# Patient Record
Sex: Female | Born: 1955 | Race: White | Hispanic: No | Marital: Married | Smoking: Never smoker
Health system: Southern US, Community
[De-identification: ages and names within clinical notes are randomized; demographics above are authoritative.]

## PROBLEM LIST (undated history)

## (undated) DIAGNOSIS — E785 Hyperlipidemia, unspecified: Secondary | ICD-10-CM

## (undated) DIAGNOSIS — F419 Anxiety disorder, unspecified: Secondary | ICD-10-CM

## (undated) DIAGNOSIS — F429 Obsessive-compulsive disorder, unspecified: Secondary | ICD-10-CM

## (undated) DIAGNOSIS — E66811 Obesity, class 1: Secondary | ICD-10-CM

## (undated) DIAGNOSIS — F32A Depression, unspecified: Secondary | ICD-10-CM

## (undated) DIAGNOSIS — Z8619 Personal history of other infectious and parasitic diseases: Secondary | ICD-10-CM

## (undated) DIAGNOSIS — I10 Essential (primary) hypertension: Secondary | ICD-10-CM

## (undated) DIAGNOSIS — F329 Major depressive disorder, single episode, unspecified: Secondary | ICD-10-CM

## (undated) DIAGNOSIS — E559 Vitamin D deficiency, unspecified: Secondary | ICD-10-CM

## (undated) DIAGNOSIS — Z87442 Personal history of urinary calculi: Secondary | ICD-10-CM

## (undated) DIAGNOSIS — T7840XA Allergy, unspecified, initial encounter: Secondary | ICD-10-CM

## (undated) DIAGNOSIS — M1990 Unspecified osteoarthritis, unspecified site: Secondary | ICD-10-CM

## (undated) DIAGNOSIS — J40 Bronchitis, not specified as acute or chronic: Secondary | ICD-10-CM

## (undated) DIAGNOSIS — J45909 Unspecified asthma, uncomplicated: Secondary | ICD-10-CM

## (undated) DIAGNOSIS — R7303 Prediabetes: Secondary | ICD-10-CM

## (undated) DIAGNOSIS — E669 Obesity, unspecified: Secondary | ICD-10-CM

## (undated) HISTORY — PX: DILATION AND CURETTAGE OF UTERUS: SHX78

## (undated) HISTORY — DX: Personal history of other infectious and parasitic diseases: Z86.19

## (undated) HISTORY — DX: Obesity, class 1: E66.811

## (undated) HISTORY — PX: BREAST SURGERY: SHX581

## (undated) HISTORY — DX: Unspecified asthma, uncomplicated: J45.909

## (undated) HISTORY — PX: TUBAL LIGATION: SHX77

## (undated) HISTORY — DX: Anxiety disorder, unspecified: F41.9

## (undated) HISTORY — DX: Vitamin D deficiency, unspecified: E55.9

## (undated) HISTORY — DX: Obesity, unspecified: E66.9

## (undated) HISTORY — DX: Obsessive-compulsive disorder, unspecified: F42.9

## (undated) HISTORY — PX: ACHILLES TENDON SURGERY: SHX542

## (undated) HISTORY — DX: Hyperlipidemia, unspecified: E78.5

## (undated) HISTORY — DX: Prediabetes: R73.03

## (undated) HISTORY — DX: Allergy, unspecified, initial encounter: T78.40XA

## (undated) HISTORY — DX: Depression, unspecified: F32.A

## (undated) HISTORY — DX: Major depressive disorder, single episode, unspecified: F32.9

## (undated) HISTORY — PX: BACK SURGERY: SHX140

## (undated) HISTORY — DX: Essential (primary) hypertension: I10

---

## 1998-08-14 ENCOUNTER — Ambulatory Visit (HOSPITAL_COMMUNITY): Admission: RE | Admit: 1998-08-14 | Discharge: 1998-08-14 | Payer: Self-pay | Admitting: *Deleted

## 1998-08-22 ENCOUNTER — Ambulatory Visit (HOSPITAL_COMMUNITY): Admission: RE | Admit: 1998-08-22 | Discharge: 1998-08-22 | Payer: Self-pay | Admitting: *Deleted

## 1998-09-03 ENCOUNTER — Ambulatory Visit (HOSPITAL_COMMUNITY): Admission: RE | Admit: 1998-09-03 | Discharge: 1998-09-03 | Payer: Self-pay | Admitting: *Deleted

## 1999-03-13 ENCOUNTER — Ambulatory Visit (HOSPITAL_COMMUNITY): Admission: RE | Admit: 1999-03-13 | Discharge: 1999-03-13 | Payer: Self-pay | Admitting: *Deleted

## 1999-07-15 ENCOUNTER — Other Ambulatory Visit: Admission: RE | Admit: 1999-07-15 | Discharge: 1999-07-15 | Payer: Self-pay | Admitting: *Deleted

## 1999-08-05 ENCOUNTER — Ambulatory Visit (HOSPITAL_COMMUNITY): Admission: RE | Admit: 1999-08-05 | Discharge: 1999-08-05 | Payer: Self-pay | Admitting: *Deleted

## 1999-09-22 ENCOUNTER — Ambulatory Visit (HOSPITAL_COMMUNITY): Admission: RE | Admit: 1999-09-22 | Discharge: 1999-09-22 | Payer: Self-pay | Admitting: *Deleted

## 2000-07-27 ENCOUNTER — Other Ambulatory Visit: Admission: RE | Admit: 2000-07-27 | Discharge: 2000-07-27 | Payer: Self-pay | Admitting: *Deleted

## 2000-08-12 ENCOUNTER — Ambulatory Visit (HOSPITAL_COMMUNITY): Admission: RE | Admit: 2000-08-12 | Discharge: 2000-08-12 | Payer: Self-pay | Admitting: *Deleted

## 2001-08-16 ENCOUNTER — Ambulatory Visit (HOSPITAL_COMMUNITY): Admission: RE | Admit: 2001-08-16 | Discharge: 2001-08-16 | Payer: Self-pay | Admitting: Internal Medicine

## 2001-08-16 ENCOUNTER — Encounter: Payer: Self-pay | Admitting: Internal Medicine

## 2001-08-23 ENCOUNTER — Other Ambulatory Visit: Admission: RE | Admit: 2001-08-23 | Discharge: 2001-08-23 | Payer: Self-pay | Admitting: Internal Medicine

## 2002-05-25 ENCOUNTER — Encounter: Payer: Self-pay | Admitting: Internal Medicine

## 2002-05-25 ENCOUNTER — Ambulatory Visit (HOSPITAL_COMMUNITY): Admission: RE | Admit: 2002-05-25 | Discharge: 2002-05-25 | Payer: Self-pay | Admitting: Internal Medicine

## 2002-11-23 ENCOUNTER — Ambulatory Visit (HOSPITAL_COMMUNITY): Admission: RE | Admit: 2002-11-23 | Discharge: 2002-11-23 | Payer: Self-pay | Admitting: Internal Medicine

## 2002-11-23 ENCOUNTER — Encounter: Payer: Self-pay | Admitting: Internal Medicine

## 2002-12-07 HISTORY — PX: LUMBAR DISC SURGERY: SHX700

## 2003-09-05 ENCOUNTER — Other Ambulatory Visit: Admission: RE | Admit: 2003-09-05 | Discharge: 2003-09-05 | Payer: Self-pay | Admitting: Internal Medicine

## 2003-12-03 ENCOUNTER — Ambulatory Visit (HOSPITAL_COMMUNITY): Admission: RE | Admit: 2003-12-03 | Discharge: 2003-12-03 | Payer: Self-pay | Admitting: Internal Medicine

## 2004-07-28 ENCOUNTER — Ambulatory Visit (HOSPITAL_COMMUNITY): Admission: RE | Admit: 2004-07-28 | Discharge: 2004-07-29 | Payer: Self-pay | Admitting: Neurosurgery

## 2005-01-09 ENCOUNTER — Ambulatory Visit (HOSPITAL_COMMUNITY): Admission: RE | Admit: 2005-01-09 | Discharge: 2005-01-09 | Payer: Self-pay | Admitting: Internal Medicine

## 2005-09-30 ENCOUNTER — Ambulatory Visit (HOSPITAL_COMMUNITY): Admission: RE | Admit: 2005-09-30 | Discharge: 2005-09-30 | Payer: Self-pay | Admitting: Obstetrics & Gynecology

## 2005-09-30 ENCOUNTER — Encounter (INDEPENDENT_AMBULATORY_CARE_PROVIDER_SITE_OTHER): Payer: Self-pay | Admitting: Specialist

## 2005-12-06 ENCOUNTER — Emergency Department (HOSPITAL_COMMUNITY): Admission: EM | Admit: 2005-12-06 | Discharge: 2005-12-06 | Payer: Self-pay | Admitting: Family Medicine

## 2006-01-21 ENCOUNTER — Ambulatory Visit (HOSPITAL_COMMUNITY): Admission: RE | Admit: 2006-01-21 | Discharge: 2006-01-21 | Payer: Self-pay | Admitting: Internal Medicine

## 2006-07-15 ENCOUNTER — Encounter (INDEPENDENT_AMBULATORY_CARE_PROVIDER_SITE_OTHER): Payer: Self-pay | Admitting: *Deleted

## 2006-07-15 ENCOUNTER — Ambulatory Visit (HOSPITAL_COMMUNITY): Admission: RE | Admit: 2006-07-15 | Discharge: 2006-07-15 | Payer: Self-pay | Admitting: *Deleted

## 2007-02-24 ENCOUNTER — Ambulatory Visit (HOSPITAL_COMMUNITY): Admission: RE | Admit: 2007-02-24 | Discharge: 2007-02-24 | Payer: Self-pay | Admitting: Internal Medicine

## 2008-03-15 ENCOUNTER — Ambulatory Visit (HOSPITAL_COMMUNITY): Admission: RE | Admit: 2008-03-15 | Discharge: 2008-03-15 | Payer: Self-pay | Admitting: Internal Medicine

## 2008-10-10 ENCOUNTER — Other Ambulatory Visit: Admission: RE | Admit: 2008-10-10 | Discharge: 2008-10-10 | Payer: Self-pay | Admitting: Internal Medicine

## 2009-05-07 ENCOUNTER — Ambulatory Visit (HOSPITAL_COMMUNITY): Admission: RE | Admit: 2009-05-07 | Discharge: 2009-05-07 | Payer: Self-pay | Admitting: Internal Medicine

## 2009-11-28 ENCOUNTER — Encounter (INDEPENDENT_AMBULATORY_CARE_PROVIDER_SITE_OTHER): Payer: Self-pay | Admitting: *Deleted

## 2010-01-16 LAB — HM COLONOSCOPY

## 2010-06-02 ENCOUNTER — Ambulatory Visit (HOSPITAL_COMMUNITY): Admission: RE | Admit: 2010-06-02 | Discharge: 2010-06-02 | Payer: Self-pay | Admitting: Internal Medicine

## 2010-12-02 ENCOUNTER — Other Ambulatory Visit
Admission: RE | Admit: 2010-12-02 | Discharge: 2010-12-02 | Payer: Self-pay | Source: Home / Self Care | Admitting: Internal Medicine

## 2011-04-24 NOTE — Op Note (Signed)
NAME:  Sonya Peterson, Sonya Peterson                         ACCOUNT NO.:  [id number]   MEDICAL RECORD NO.:  [id number]                   PATIENT TYPE:  OIB   LOCATION:  3172                                 FACILITY:  [facility]   PHYSICIAN:  Donalee Citrin, M.D.                     DATE OF BIRTH:  [birth year]   DATE OF PROCEDURE:  07/28/2004  DATE OF DISCHARGE:                                 OPERATIVE REPORT   PREOPERATIVE DIAGNOSIS:  Right L5 radiculopathy from a large ruptured disk,  L4-5, right.   OPERATION/PROCEDURE:  Lumbar laminectomy and microdiskectomy, L4-5 right.   SURGEON:  Donalee Citrin, M.D.   ANESTHESIA:  General.   HISTORY OF PRESENT ILLNESS:  The patient is a very pleasant 55 year old  female who has had longstanding back and right leg pain that began in  buttock and radiated down her back side of her right leg crossing over her  foot and her big toe.  The patient had weakness of the big toe with good  strength on dorsiflexion.  The patient failed all modalities of conservative  treatment.  She showed a very large ruptured disk with a free fragment  compressing the thecal sac and right L5 nerve root.  The patient was  recommended laminectomy and microdiskectomy.  I discussed all the risks and  benefits of surgery with him and he agreed to proceed forth.   DESCRIPTION OF PROCEDURE:  The patient was brought to the OR and received  general anesthesia, positioned prone on the Wilson table and spine was  prepped and draped in the usual sterile fashion, preoperative x-ray was  performed localizing the L4-L5 disk space.  After infiltration of 10 mL of  lidocaine, a midline incision was made and Bovie electrocautery was used to  deepen down to the subcutaneous tissue.  Subperiosteal dissection was  carried out to the lamina on the right side of L4-5.  An intraoperative x-  ray confirmed localization of the L4-5 disk space and then using high-speed  drill __________ lamina of L4 medial of the facet  complex and L4-5 was  drilled down.  By using a 3 mm and 4 mm Kerrison punch, the remainder of the  lamina was removed exposing the ligamentous flavum.  This was removed in a  piecemeal fashion exposing the thecal sac.  Then the operative microscope  was draped and brought onto the field __________ the right L5 nerve root was  identified and the right L5 neural foramina was unroofed using the 3 mm  Kerrison punch.  The remainder of the ligament was underneath the L4-5 facet  and then using a #4 Penfield and blunt nerve hook, the L5 nerve root was  dissected free from a free fragment that migrated caudally behind the L5  vertebral body from the L4-5 disk space.  The nerve root was noted to  markedly adherent to this fragment and  the disk was teased away with #4  Penfield and blunt nerve hook.  The L5 disk was removed and the disk space  was incised with a 11-blade scalpel and the disk was radically cleaned out.  __________ central compartment as well as inferiorly underneath the nerve  root.  After the disk spaces were adequately cleaned out with a combination  of pituitary rongeurs and downgoing Epstein curettes.  The thecal sac was  explored with a coronary dilator and a hockey stick and noted to have no  further stenosis cephalad, caudad, medial, and laterally.  Then the wound  was copiously irrigated and meticulous hemostasis was maintained.  Gelfoam  was laid over the top of the dura.  The muscle fascia reapproximated with 0  interrupted Vicryl, subcutaneous tissue  closed with 2-0 interrupted Vicryl and skin was closed with a running 4-0  subcuticular and Benzoin and Steri-Strips were applied.  The patient went to  the recovery room in stable condition.  At the end of the case sponge and  needle counts correct.                                               Donalee Citrin, M.D.    GC/MEDQ  D:  07/28/2004  T:  07/28/2004  Job:  [id number]

## 2011-04-24 NOTE — Op Note (Signed)
[name], Sonya Peterson               ACCOUNT NO.:  [id number]   MEDICAL RECORD NO.:  [id number]          PATIENT TYPE:  AMB   LOCATION:  SDC                           FACILITY:  [facility]   PHYSICIAN:  Kenbridge B. Earlene Plater, M.D.  DATE OF BIRTH:  Feb 15, 1956   DATE OF PROCEDURE:  07/15/2006  DATE OF DISCHARGE:                                 OPERATIVE REPORT   PREOPERATIVE DIAGNOSIS:  Menorrhagia.   POSTOPERATIVE DIAGNOSIS:  Menorrhagia.   PROCEDURE:  Hysteroscopy, dilatation and curettage, NovaSure.   SURGEON:  Chester Holstein. Earlene Plater, M.D.   ASSISTANT:  None.   ANESTHESIA:  MAC and 20 mL 1% Nesacaine paracervical block.   SPECIMENS:  Endometrial curettings to pathology.   ESTIMATED BLOOD LOSS:  Minimal.   FLUID DEFICIT:  5 mL of saline.   COMPLICATIONS:  None.   INDICATIONS:  The patient with a history of heavy menstrual bleeding.  Ultrasound showed no focal mass.  The patient advised of the risks of  surgery including infection, bleeding, damage to surrounding organs, uterine  perforation.  Also advised of approximately 50-60% amenorrhea rate and 85%  overall satisfaction rate after this procedure.   The patient was taken to the operating room and MAC anesthesia obtained.  The patient was placed in the [facility] stirrups and prepped and draped in  standard fashion, bladder emptied with in-and-out catheterization.  Examination under anesthesia showed a midplane uterus and normal size  without adnexal masses.   Speculum inserted, paracervical block placed and a single-tooth tenaculum  attached to the anterior lip.  The uterus was sounded to 9 cm and cervical  length estimated at 4.5 cm with the dilator.   The cervix was patulous and easily allowed passage of the diagnostic  hysteroscope, which after being flushed no focal masses were seen in the  uterus, only shaggy-appearing endometrium.  The endometrium was gently  curetted and submitted to pathology.   The NovaSure device was inserted to  the fundus, array deployed and standard  maneuvers undertaken to position the array properly.  Given the patulous  cervix, a piece of Vaseline gauze was placed around the cervical os to  facilitate the CO2 test, which was then performed and passed.   The NovaSure cavity width was 3 cm and the device was activated and ablated  for 2 minutes.   The NovaSure array retracted and device removed.  The scope reinserted and  good endometrial coverage noted.   The patient tolerated the procedure well with no complications.  She was  taken to the recovery room awake, alert and in stable condition.      Gerri Spore B. Earlene Plater, M.D.  Electronically Signed     WBD/MEDQ  D:  07/15/2006  T:  07/15/2006  Job:  [id number]

## 2011-04-24 NOTE — Op Note (Signed)
Sonya Peterson, Sonya Peterson               ACCOUNT NO.:  [id number]   MEDICAL RECORD NO.:  [id number]          PATIENT TYPE:  AMB   LOCATION:  SDC                           FACILITY:  [facility]   PHYSICIAN:  Genia Del, M.D.DATE OF BIRTH:  07/10/1956   DATE OF PROCEDURE:  09/30/2005  DATE OF DISCHARGE:                                 OPERATIVE REPORT   PREOPERATIVE DIAGNOSIS:  Menorrhagia with intrauterine lesion.   POSTOPERATIVE DIAGNOSIS:  Menorrhagia with intrauterine lesion.   INTERVENTION:  Hysteroscopy with resection of intrauterine lesion and  dilatation and curettage.   SURGEON:  Genia Del, M.D.   ANESTHESIOLOGIST:  Burnett Corrente, M.D.   PROCEDURE:  Under general anesthesia with endotracheal intubation, the  patient is in lithotomy position.  She is prepped with Betadine on the  suprapubic, vulvar and vaginal areas, the bladder is catheterized and the  patient is draped as usual.  The vaginal exam under general anesthesia  reveals an anteverted uterus, normal volume, no adnexal mass.  The speculum  is inserted in the vagina.  The anterior lip of the cervix is grasped with a  tenaculum.  A paracervical block is done with Nesacaine 1% 20 mL total at 4  and 8 o'clock.  We then dilate the cervix with Hegar dilators up to #33  without difficulty.  We use the resectoscope directly with the loop.  We  visualize the intrauterine cavity.  Both ostia are seen and pictures are  taken.  A small about 1 cm endometrial polyp is seen at the posterior  fundus.  We use the loop to resect the lesion at the base and send it  separately to pathology.  We then take pictures after resection.  We remove  the resectoscope.  Systematic intrauterine curettage is done with a sharp  curette.  The specimen is sent to pathology.  We remove all instruments.  Hemostasis is adequate.  The estimated blood loss is minimal.  Fluid deficit  was minimal.  No complication occurred, and the patient was  brought to the  recovery room in good status.      Genia Del, M.D.  Electronically Signed     ML/MEDQ  D:  09/30/2005  T:  09/30/2005  Job:  [id number]

## 2011-05-11 ENCOUNTER — Ambulatory Visit (HOSPITAL_COMMUNITY)
Admission: RE | Admit: 2011-05-11 | Discharge: 2011-05-11 | Disposition: A | Discharge status: Home / Self Care | Source: Ambulatory Visit | Attending: Internal Medicine | Admitting: Internal Medicine

## 2011-05-11 ENCOUNTER — Other Ambulatory Visit (HOSPITAL_COMMUNITY): Payer: Self-pay | Admitting: Internal Medicine

## 2011-05-11 DIAGNOSIS — R52 Pain, unspecified: Secondary | ICD-10-CM

## 2011-05-11 DIAGNOSIS — M7730 Calcaneal spur, unspecified foot: Secondary | ICD-10-CM | POA: Insufficient documentation

## 2011-07-27 ENCOUNTER — Other Ambulatory Visit (HOSPITAL_COMMUNITY): Payer: Self-pay | Admitting: Internal Medicine

## 2011-07-27 DIAGNOSIS — Z1231 Encounter for screening mammogram for malignant neoplasm of breast: Secondary | ICD-10-CM

## 2011-07-30 ENCOUNTER — Ambulatory Visit (HOSPITAL_COMMUNITY)
Admission: RE | Admit: 2011-07-30 | Discharge: 2011-07-30 | Disposition: A | Discharge status: Home / Self Care | Source: Ambulatory Visit | Attending: Internal Medicine | Admitting: Internal Medicine

## 2011-07-30 DIAGNOSIS — Z1231 Encounter for screening mammogram for malignant neoplasm of breast: Secondary | ICD-10-CM

## 2013-03-02 ENCOUNTER — Other Ambulatory Visit (HOSPITAL_COMMUNITY)
Admission: RE | Admit: 2013-03-02 | Discharge: 2013-03-02 | Disposition: A | Discharge status: Home / Self Care | Source: Ambulatory Visit | Attending: Internal Medicine | Admitting: Internal Medicine

## 2013-03-02 ENCOUNTER — Other Ambulatory Visit: Payer: Self-pay

## 2013-03-02 DIAGNOSIS — Z1151 Encounter for screening for human papillomavirus (HPV): Secondary | ICD-10-CM | POA: Insufficient documentation

## 2013-03-02 DIAGNOSIS — Z01419 Encounter for gynecological examination (general) (routine) without abnormal findings: Secondary | ICD-10-CM | POA: Insufficient documentation

## 2013-03-03 LAB — HM PAP SMEAR: HM Pap smear: NORMAL

## 2013-09-26 ENCOUNTER — Other Ambulatory Visit (HOSPITAL_COMMUNITY): Payer: Self-pay | Admitting: Internal Medicine

## 2013-09-26 DIAGNOSIS — Z1231 Encounter for screening mammogram for malignant neoplasm of breast: Secondary | ICD-10-CM

## 2013-10-09 ENCOUNTER — Ambulatory Visit (HOSPITAL_COMMUNITY)
Admission: RE | Admit: 2013-10-09 | Discharge: 2013-10-09 | Disposition: A | Discharge status: Home / Self Care | Source: Ambulatory Visit | Attending: Internal Medicine | Admitting: Internal Medicine

## 2013-10-09 DIAGNOSIS — Z1231 Encounter for screening mammogram for malignant neoplasm of breast: Secondary | ICD-10-CM | POA: Insufficient documentation

## 2013-10-11 ENCOUNTER — Other Ambulatory Visit: Payer: Self-pay | Admitting: Internal Medicine

## 2013-10-11 DIAGNOSIS — R928 Other abnormal and inconclusive findings on diagnostic imaging of breast: Secondary | ICD-10-CM

## 2013-10-30 ENCOUNTER — Ambulatory Visit
Admission: RE | Admit: 2013-10-30 | Discharge: 2013-10-30 | Disposition: A | Discharge status: Home / Self Care | Payer: PRIVATE HEALTH INSURANCE | Source: Ambulatory Visit | Attending: Internal Medicine | Admitting: Internal Medicine

## 2013-10-30 DIAGNOSIS — R928 Other abnormal and inconclusive findings on diagnostic imaging of breast: Secondary | ICD-10-CM

## 2013-11-07 ENCOUNTER — Encounter: Payer: Self-pay | Admitting: Internal Medicine

## 2013-11-07 DIAGNOSIS — E559 Vitamin D deficiency, unspecified: Secondary | ICD-10-CM | POA: Insufficient documentation

## 2013-11-07 DIAGNOSIS — E785 Hyperlipidemia, unspecified: Secondary | ICD-10-CM | POA: Insufficient documentation

## 2013-11-07 DIAGNOSIS — F329 Major depressive disorder, single episode, unspecified: Secondary | ICD-10-CM | POA: Insufficient documentation

## 2013-11-07 DIAGNOSIS — I10 Essential (primary) hypertension: Secondary | ICD-10-CM | POA: Insufficient documentation

## 2013-11-07 DIAGNOSIS — T7840XA Allergy, unspecified, initial encounter: Secondary | ICD-10-CM | POA: Insufficient documentation

## 2013-11-09 ENCOUNTER — Ambulatory Visit (INDEPENDENT_AMBULATORY_CARE_PROVIDER_SITE_OTHER): Admitting: Physician Assistant

## 2013-11-09 ENCOUNTER — Encounter: Payer: Self-pay | Admitting: Physician Assistant

## 2013-11-09 VITALS — BP 128/70 | HR 68 | Temp 98.2°F | Resp 16 | Ht 65.0 in | Wt 202.0 lb

## 2013-11-09 DIAGNOSIS — I10 Essential (primary) hypertension: Secondary | ICD-10-CM

## 2013-11-09 DIAGNOSIS — R7309 Other abnormal glucose: Secondary | ICD-10-CM

## 2013-11-09 DIAGNOSIS — R7303 Prediabetes: Secondary | ICD-10-CM

## 2013-11-09 DIAGNOSIS — E559 Vitamin D deficiency, unspecified: Secondary | ICD-10-CM

## 2013-11-09 DIAGNOSIS — E785 Hyperlipidemia, unspecified: Secondary | ICD-10-CM

## 2013-11-09 HISTORY — DX: Prediabetes: R73.03

## 2013-11-09 LAB — BASIC METABOLIC PANEL WITH GFR
BUN: 15 mg/dL (ref 6–23)
CO2: 29 mEq/L (ref 19–32)
Chloride: 102 mEq/L (ref 96–112)
GFR, Est African American: >89 mL/min
Glucose, Bld: 78 mg/dL (ref 70–99)
Potassium: 4 mEq/L (ref 3.5–5.3)
Sodium: 139 mEq/L (ref 135–145)

## 2013-11-09 LAB — HEPATIC FUNCTION PANEL
ALT: 13 U/L (ref 0–35)
AST: 14 U/L (ref 0–37)
Bilirubin, Direct: 0.1 mg/dL (ref 0.0–0.3)
Indirect Bilirubin: 0.3 mg/dL (ref 0.0–0.9)
Total Bilirubin: 0.4 mg/dL (ref 0.3–1.2)
Total Protein: 7.5 g/dL (ref 6.0–8.3)

## 2013-11-09 LAB — CBC WITH DIFFERENTIAL/PLATELET
Basophils Relative: 1 % (ref 0–1)
Eosinophils Absolute: 0.4 10*3/uL (ref 0.0–0.7)
Eosinophils Relative: 4 % (ref 0–5)
Hemoglobin: 14.1 g/dL (ref 12.0–15.0)
Lymphs Abs: 2.4 10*3/uL (ref 0.7–4.0)
MCH: 29.5 pg (ref 26.0–34.0)
Neutro Abs: 6.4 10*3/uL (ref 1.7–7.7)
Platelets: 303 10*3/uL (ref 150–400)

## 2013-11-09 LAB — LIPID PANEL
Cholesterol: 126 mg/dL (ref 0–200)
HDL: 36 mg/dL — ABNORMAL LOW (ref >39)
Total CHOL/HDL Ratio: 3.5 Ratio
Triglycerides: 128 mg/dL (ref <150)
VLDL: 26 mg/dL (ref 0–40)

## 2013-11-09 LAB — TSH: TSH: 0.912 u[IU]/mL (ref 0.350–4.500)

## 2013-11-09 NOTE — Patient Instructions (Signed)
   Bad carbs also include fruit juice, alcohol, and sweet tea. These are empty calories that do not signal to your brain that you are full.   Please remember the good carbs are still carbs which convert into sugar. So please measure them out no more than 1/2-1 cup of rice, oatmeal, pasta, and beans.  Veggies are however free foods! Pile them on.   I like lean protein at every meal such as chicken, turkey, pork chops, cottage cheese, etc. Just do not fry these meats and please center your meal around vegetable, the meats should be a side dish.   No all fruit is created equal. Please see the list below, the fruit at the bottom is higher in sugars than the fruit at the top   We want weight loss that will last so you should lose 1-2 pounds a week.  THAT IS IT! Please pick THREE things a month to change. Once it is a habit check off the item. Then pick another three items off the list to become habits.  If you are already doing a habit on the list GREAT!  Cross that item off! o Don't drink your calories. Ie, alcohol, soda, fruit juice, and sweet tea.  o Drink more water. Drink a glass when you feel hungry or before each meal.  o Eat breakfast - Complex carb and protein (likeDannon light and fit yogurt, oatmeal, fruit, eggs, turkey bacon). o Measure your cereal.  Eat no more than one cup a day. (ie Kashi) o Eat an apple a day. o Add a vegetable a day. o Try a new vegetable a month. o Use Pam! Stop using oil or butter to cook. o Don't finish your plate or use smaller plates. o Share your dessert. o Eat sugar free Jello for dessert or frozen grapes. o Don't eat 2-3 hours before bed. o Switch to whole wheat bread, pasta, and brown rice. o Make healthier choices when you eat out. No fries! o Pick baked chicken, NOT fried. o Don't forget to SLOW DOWN when you eat. It is not going anywhere.  o Take the stairs. o Park far away in the parking lot o Lift soup cans (or weights) for 10 minutes while  watching TV. o Walk at work for 10 minutes during break. o Walk outside 1 time a week with your friend, kids, dog, or significant other. o Start a walking group at church. o Walk the mall as much as you can tolerate.  o Keep a food diary. o Weigh yourself daily. o Walk for 15 minutes 3 days per week. o Cook at home more often and eat out less.  If life happens and you go back to old habits, it is okay.  Just start over. You can do it!   If you experience chest pain, get short of breath, or tired during the exercise, please stop immediately and inform your doctor.   

## 2013-11-09 NOTE — Progress Notes (Signed)
HPI Patient presents for 3 month follow up with hypertension, hyperlipidemia, prediabetes and vitamin D. Patient's blood pressure has been controlled at home. Patient denies chest pain, shortness of breath, dizziness.  Patient's cholesterol is diet controlled. In addition they are on zocor and denies myalgias. The cholesterol last visit was HDL is 39 with goal of 50 and LDL is 59. The patient has been working on diet and exercise for prediabetes, and denies changes in vision, polys, and paresthesias. A1C is 5.7. Patient is on Vitamin D supplement.  Current Medications:  Current Outpatient Prescriptions on File Prior to Visit  Medication Sig Dispense Refill  . Cholecalciferol (VITAMIN D PO) Take 5,000 Int'l Units by mouth.      Marland Kitchen FLUoxetine HCl (PROZAC PO) Take 60 mg by mouth.      . loratadine (CLARITIN) 10 MG tablet Take 10 mg by mouth daily.      . meloxicam (MOBIC) 7.5 MG tablet Take 7.5 mg by mouth daily.      . mometasone (ASMANEX 120 METERED DOSES) 220 MCG/INH inhaler Inhale 2 puffs into the lungs daily.      . montelukast (SINGULAIR) 10 MG tablet Take 10 mg by mouth at bedtime.      . simvastatin (ZOCOR) 40 MG tablet Take 40 mg by mouth daily.       No current facility-administered medications on file prior to visit.   Medical History:  Past Medical History  Diagnosis Date  . Hyperlipidemia   . Hypertension   . Allergy   . Vitamin D deficiency   . Depression    Allergies: No Known Allergies  ROS Constitutional: Denies fever, chills, weight loss/gain, headaches, insomnia, fatigue, night sweats, and change in appetite. Eyes: Denies redness, blurred vision, diplopia, discharge, itchy, watery eyes.  ENT: Denies discharge, congestion, post nasal drip, sore throat, earache, dental pain, Tinnitus, Vertigo, Sinus pain, snoring.  Cardio: Denies chest pain, palpitations, irregular heartbeat,  dyspnea, diaphoresis, orthopnea, PND, claudication, edema Respiratory: denies cough,  dyspnea,pleurisy, hoarseness, wheezing.  Gastrointestinal: Denies dysphagia, heartburn,  water brash, pain, cramps, nausea, vomiting, bloating, diarrhea, constipation, hematemesis, melena, hematochezia,  hemorrhoids Genitourinary: Denies dysuria, frequency, urgency, nocturia, hesitancy, discharge, hematuria, flank pain Musculoskeletal: Denies arthralgia, myalgia, stiffness, Jt. Swelling, pain, limp, and strain/sprain. Skin: Denies pruritis, rash, hives, warts, acne, eczema, changing in skin lesion Neuro: Denies Weakness, tremor, incoordination, spasms, paresthesia, pain Psychiatric: Denies confusion, memory loss, sensory loss Endocrine: Denies change in weight, skin, hair change, nocturia, and paresthesia, Diabetic Polys, Denies visual blurring, hyper /hypo glycemic episodes.  Heme/Lymph: Denies Excessive bleeding, bruising, enlarged lymph nodes  Family history- Review and unchanged Social history- Review and unchanged Physical Exam: Filed Vitals:   11/09/13 0955  BP: 128/70  Pulse: 68  Temp: 98.2 F (36.8 C)  Resp: 16   Filed Weights   11/09/13 0955  Weight: 202 lb (91.627 kg)   General Appearance: Well nourished, in no apparent distress. Eyes: PERRLA, EOMs, conjunctiva no swelling or erythema, normal fundi and vessels. Sinuses: No Frontal/maxillary tenderness ENT/Mouth: Ext aud canals clear, with TMs without erythema, bulging.No erythema, swelling, or exudate on post pharynx.  Tonsils not swollen or erythematous. Hearing normal.  Neck: Supple, thyroid normal.  Respiratory: Respiratory effort normal, BS equal bilaterally without rales, rhonchi, wheezing or stridor.  Cardio: Heart sounds normal, regular rate and rhythm without murmurs, rubs or gallops. Peripheral pulses brisk and equal bilaterally, without edema.  Abdomen: Obese, soft, with bowel sounds. Non tender, no guarding, rebound, hernias, masses, or organomegaly.  Lymphatics: Non tender without lymphadenopathy.   Musculoskeletal: Full ROM all peripheral extremities, joint stability, 5/5 strength, and normal gait. Skin: Warm, dry without rashes, lesions, ecchymosis.  Neuro: Cranial nerves intact, reflexes equal bilaterally. Normal muscle tone, no cerebellar symptoms. Sensation intact.  Psych: Awake and oriented X 3, normal affect, Insight and Judgment appropriate.   Assessment and Plan:  Hypertension: Continue medication, monitor blood pressure at home. Continue DASH diet. Cholesterol: Continue diet and exercise. Check cholesterol.  Pre-diabetes-Continue diet and exercise. Check A1C Vitamin D Def- check level and continue medications.   Sonya Peterson 10:00 AM

## 2013-12-05 ENCOUNTER — Other Ambulatory Visit: Payer: Self-pay | Admitting: Internal Medicine

## 2013-12-07 HISTORY — PX: HEEL SPUR SURGERY: SHX665

## 2013-12-19 ENCOUNTER — Other Ambulatory Visit: Payer: Self-pay | Admitting: Physician Assistant

## 2013-12-19 MED ORDER — MELOXICAM 7.5 MG PO TABS: 7.5000 mg | ORAL_TABLET | Freq: Every day | ORAL | Status: DC

## 2013-12-27 ENCOUNTER — Ambulatory Visit (INDEPENDENT_AMBULATORY_CARE_PROVIDER_SITE_OTHER)

## 2013-12-27 VITALS — BP 128/77 | HR 89 | Resp 12 | Ht 66.0 in | Wt 190.0 lb

## 2013-12-27 DIAGNOSIS — Q828 Other specified congenital malformations of skin: Secondary | ICD-10-CM

## 2013-12-27 DIAGNOSIS — M79609 Pain in unspecified limb: Secondary | ICD-10-CM

## 2013-12-27 DIAGNOSIS — B070 Plantar wart: Secondary | ICD-10-CM

## 2013-12-27 NOTE — Progress Notes (Signed)
   Subjective:    Patient ID: Sonya Peterson, female    DOB: August 26, 1956, 58 y.o.   MRN: [id number]  HPI Comments: '' CALLUSES ON BOTH FEET AND THEY HURTING WHEN WALKING.'' THEY BEEN HURTING FOR 1 MONTH AND GETTING WORSE AND TRY TO PUT LOTION.''     Review of Systems  Eyes: Positive for itching.  Musculoskeletal: Positive for back pain.  All other systems reviewed and are negative.       Objective:   Physical Exam Neurovascular status is intact pedal pulses palpable DP and PT posterior were for Refill timed 3-4 seconds skin temperature warm turgor normal no edema rubor pallor or varicosities noted. Neurologically epicritic and proprioceptive sensations intact and symmetric. On much exam there is multiple multiple nucleated keratotic lesions well-circumscribed plantar heel mid arch lateral column of the foot and under the hallux left and a single lesion right hallux. These are painful on direct lateral compression consistent with verruca plantaris for support keratoses to       Assessment & Plan:  Assessment verruca plantaris for support keratosis multiple lesions left foot. Recommend patient topical salicylic acid Aquasol instruction sheet is dispensed. Follow the instructions for topical salicylic acid application for 786 day duration under occlusion with duct tape discontinue any irritation followup in a month if fails to improve or resolve.  Harriet Masson DPM

## 2013-12-27 NOTE — Patient Instructions (Addendum)
WARTS (Verrucae)  Warts are caused by a virus that has invaded the skin.  They are more common in young adults and children and a small percentage will resolve on their own.  There are many types of warts including mosaic warts (large flat), vulgaris (domed warts-have pearl like appearance), and plantar warts (flat or cauliflower like appearance).  Warts are highly contagious and may be picked up from any surface.  Warts thrive in a warm moist environment and are common near pools, showers, and locker room floors.  Any microscopic cut in the skin is where the virus enters and becomes a wart.  Warts are very difficult to treat and get rid of.  Patience is necessary in the treatment of this virus.  It may take months to cure and different methods may have to be used to get rid of your wart.  Standard Initial Treatment is: 1. Periodic debridement of the wart and application of Canthacur to each lesion (a blistering agent that will slough off the warty skin) 2. Dispensing of topical treatments/prescriptions to apply to the wart at home  Other options include: 1. Excision of the lesion-numbing the skin around the wart and cutting it out-requires daily soaks post-operatively and takes about 2-3 weeks to fully heal 2. Excision with CO2 Laser-Performed at the surgical center your foot is numbed up and the lesions are all cut out and then lasered with a high power laser.  Very good for multiple warts that are resistant. 3. Cimetidine (Tagamet)-Oral agent used in high does--has shown better results in children  How do I apply the standard topical treatments?  1. Salicylic Acid (Compound W wart remover liquid or gel-available at drug or grocery stores)-Apply a dime size thickness over the wart and cover with duct tape-apply at night so the medication does not spread out to the good skin.  The skin will turn white and slowly blister off.  Use a pumice stone daily to remove the white skin as best you can.  If  the skin gets too raw and painful, discontinue for a few days then resume. 2. Aldara (Imiquimod)-this is an immune response modifier.  They come in little packets so try to get at least 2 days out of each packet if you can.  Apply a small amount to the lesion and cover with duct tape.  Do not rub it in-let it absorb on its own.  Good to apply each morning. Cover these areas with duct tape for 24. After application. Maintain treatment for 7-14 days  Other Helpful Hints:  Wash shoes that can be washed in the washing machine 2-3 x per month with some bleach  Use Lysol in shoes that cannot be washed and wipe out with a cloth 1 x per week-allow to dry for 8 hours before wearing again  Use a bleach solution (1 part bleach to 3 parts water) in your tub or shower to reduce the spread of the virus to yourself and others  Use aqua socks or clean sandals when at the pool or locker room to reduce the chance of picking up the virus or spreading it to others

## 2014-01-22 ENCOUNTER — Other Ambulatory Visit: Payer: Self-pay | Admitting: Physician Assistant

## 2014-01-24 ENCOUNTER — Ambulatory Visit

## 2014-03-03 ENCOUNTER — Other Ambulatory Visit: Payer: Self-pay | Admitting: Physician Assistant

## 2014-03-08 ENCOUNTER — Ambulatory Visit (INDEPENDENT_AMBULATORY_CARE_PROVIDER_SITE_OTHER): Admitting: Physician Assistant

## 2014-03-08 ENCOUNTER — Encounter: Payer: Self-pay | Admitting: Physician Assistant

## 2014-03-08 VITALS — BP 122/80 | HR 76 | Temp 97.7°F | Resp 16 | Ht 65.0 in | Wt 202.0 lb

## 2014-03-08 DIAGNOSIS — E559 Vitamin D deficiency, unspecified: Secondary | ICD-10-CM

## 2014-03-08 DIAGNOSIS — F32A Depression, unspecified: Secondary | ICD-10-CM

## 2014-03-08 DIAGNOSIS — I10 Essential (primary) hypertension: Secondary | ICD-10-CM

## 2014-03-08 DIAGNOSIS — R7303 Prediabetes: Secondary | ICD-10-CM

## 2014-03-08 DIAGNOSIS — Z79899 Other long term (current) drug therapy: Secondary | ICD-10-CM

## 2014-03-08 DIAGNOSIS — Z0000 Encounter for general adult medical examination without abnormal findings: Secondary | ICD-10-CM

## 2014-03-08 DIAGNOSIS — F329 Major depressive disorder, single episode, unspecified: Secondary | ICD-10-CM

## 2014-03-08 DIAGNOSIS — R7309 Other abnormal glucose: Secondary | ICD-10-CM

## 2014-03-08 LAB — CBC WITH DIFFERENTIAL/PLATELET
BASOS PCT: 1 % (ref 0–1)
Basophils Absolute: 0.1 10*3/uL (ref 0.0–0.1)
EOS ABS: 0.5 10*3/uL (ref 0.0–0.7)
Eosinophils Relative: 6 % — ABNORMAL HIGH (ref 0–5)
HEMATOCRIT: 41.2 % (ref 36.0–46.0)
Hemoglobin: 13.9 g/dL (ref 12.0–15.0)
LYMPHS ABS: 2.2 10*3/uL (ref 0.7–4.0)
LYMPHS PCT: 25 % (ref 12–46)
MCH: 29.5 pg (ref 26.0–34.0)
MCHC: 33.7 g/dL (ref 30.0–36.0)
MCV: 87.5 fL (ref 78.0–100.0)
MONO ABS: 0.8 10*3/uL (ref 0.1–1.0)
MONOS PCT: 9 % (ref 3–12)
Neutro Abs: 5.3 10*3/uL (ref 1.7–7.7)
Neutrophils Relative %: 59 % (ref 43–77)
Platelets: 311 10*3/uL (ref 150–400)
RBC: 4.71 MIL/uL (ref 3.87–5.11)
RDW: 13.6 % (ref 11.5–15.5)
WBC: 8.9 10*3/uL (ref 4.0–10.5)

## 2014-03-08 LAB — HEMOGLOBIN A1C
HEMOGLOBIN A1C: 5.8 % — AB (ref <5.7)
Mean Plasma Glucose: 120 mg/dL — ABNORMAL HIGH (ref <117)

## 2014-03-08 NOTE — Progress Notes (Signed)
Complete Physical HPI 58 y.o. female  presents for a complete physical. Her blood pressure has been controlled at home, today their BP is BP: 122/80 mmHg She does not workout. She denies chest pain, shortness of breath, dizziness.  She is on cholesterol medication and denies myalgias. Her cholesterol is at goal. The cholesterol last visit was:   Lab Results  Component Value Date   CHOL 126 11/09/2013   HDL 36* 11/09/2013   LDLCALC 64 11/09/2013   TRIG 128 11/09/2013   CHOLHDL 3.5 11/09/2013   She has been working on diet and exercise for prediabetes, and denies paresthesia of the feet, polydipsia and polyuria. Last A1C in the office was:  Lab Results  Component Value Date   HGBA1C 6.1* 11/09/2013   Patient is on Vitamin D supplement.    Current Medications:  Current Outpatient Prescriptions on File Prior to Visit  Medication Sig Dispense Refill  . Cholecalciferol (VITAMIN D PO) Take 5,000 Int'l Units by mouth.      Marland Kitchen FLUoxetine (PROZAC) 20 MG capsule TAKE 3 CAPSULES (60 MG) DAILY FOR MOOD  270 capsule  0  . loratadine (CLARITIN) 10 MG tablet Take 10 mg by mouth daily.      . meloxicam (MOBIC) 7.5 MG tablet Take 1 tablet (7.5 mg total) by mouth daily.  90 tablet  1  . montelukast (SINGULAIR) 10 MG tablet TAKE 1 TABLET DAILY  90 tablet  2  . simvastatin (ZOCOR) 40 MG tablet TAKE 1 TABLET AT BEDTIME (NEED OFFICE VISIT)  90 tablet  0   No current facility-administered medications on file prior to visit.   Health Maintenance:   Immunization History  Administered Date(s) Administered  . Influenza Split 08/31/2013    Tetanus: 2006 Pneumovax: 1999 Flu vaccine: 08/2013 Zostavax: N/A Pap: 03/02/2013 neg due 2017 MGM: 10/09/2013 will have repeat 6 months on Right breast in April DEXA: N/A Colonoscopy: 2011, Dr. Earlean Shawl due 2016 EGD:N/A LMP 2008 DEE- unknown   Allergies: No Known Allergies Medical History:  Past Medical History  Diagnosis Date  . Hyperlipidemia   . Hypertension    . Allergy   . Vitamin D deficiency   . Depression   . Prediabetes 11/09/2013   Surgical History:  Past Surgical History  Procedure Laterality Date  . Lumbar disc surgery  2004   Family History:  Family History  Problem Relation Age of Onset  . Cancer Mother   . Hypertension Mother   . Diabetes Mother    Social History:  History  Substance Use Topics  . Smoking status: Never Smoker   . Smokeless tobacco: Never Used  . Alcohol Use: No     Review of Systems: [X]  = complains of  [ ]  = denies  General: Fatigue [ ]  Fever [ ]  Chills [ ]  Weakness [ ]   Insomnia [ ] Weight change [ ]  Night sweats [ ]   Change in appetite [ ]  Eyes: Redness [ ]  Blurred vision [ ]  Diplopia [ ]  Discharge [ ]   ENT: Congestion [ ]  Sinus Pain [ ]  Post Nasal Drip [ ]  Sore Throat [ ]  Earache [ ]  hearing loss [ ]  Tinnitus [ ]  Snoring [ ]   Cardiac: Chest pain/pressure [ ]  SOB [ ]  Orthopnea [ ]   Palpitations [ ]   Paroxysmal nocturnal dyspnea[ ]  Claudication [ ]  Edema [ ]   Pulmonary: Cough [ ]  Wheezing[ ]   SOB [ ]   Pleurisy [ ]   GI: Nausea [ ]  Vomiting[ ]  Dysphagia[ ]  Heartburn[ ]  Abdominal  pain [ ]  Constipation [ ] ; Diarrhea [ ]  BRBPR [ ]  Melena[ ]  Bloating [ ]  Hemorrhoids [ ]   GU: Hematuria[ ]  Dysuria [ ]  Nocturia[ ]  Urgency [ ]   Hesitancy [ ]  Discharge [ ]  Frequency [ ]   Breast:  Breast lumps [ ]   nipple discharge [ ]    Neuro: Headaches[ ]  Vertigo[ ]  Paresthesias[ ]  Spasm [ ]  Speech changes [ ]  Incoordination [ ]   Ortho: Arthritis [ ]  Joint pain  RIGHT KNEE AND ANKLE Valu.Nieves ] Muscle pain [ ]  Joint swelling [ ]  Back Pain [ ]  Skin:  Rash [ ]   Pruritis [ ]  Change in skin lesion [ ]   Psych: Depression[ ]  Anxiety[ ]  Confusion [ ]  Memory loss [ ]   Heme/Lypmh: Bleeding [ ]  Bruising [ ]  Enlarged lymph nodes [ ]   Endocrine: Visual blurring [ ]  Paresthesia [ ]  Polyuria [ ]  Polydypsea [ ]    Heat/cold intolerance [ ]  Hypoglycemia [ ]   Physical Exam: Estimated body mass index is 33.61 kg/(m^2) as calculated from the  following:   Height as of this encounter: 5\' 5"  (1.651 m).   Weight as of this encounter: 202 lb (91.627 kg). BP 122/80  Pulse 76  Temp(Src) 97.7 F (36.5 C)  Resp 16  Ht 5\' 5"  (1.651 m)  Wt 202 lb (91.627 kg)  BMI 33.61 kg/m2 General Appearance: Well nourished, in no apparent distress. Eyes: PERRLA, EOMs, conjunctiva no swelling or erythema, normal fundi and vessels. Sinuses: No Frontal/maxillary tenderness ENT/Mouth: Ext aud canals clear, normal light reflex with TMs without erythema, bulging.  Good dentition. No erythema, swelling, or exudate on post pharynx. Tonsils not swollen or erythematous. Hearing normal.  Neck: Supple, thyroid normal. No bruits Respiratory: Respiratory effort normal, BS equal bilaterally without rales, rhonchi, wheezing or stridor. Cardio: RRR without murmurs, rubs or gallops. Brisk peripheral pulses without edema.  Chest: symmetric, with normal excursions and percussion. Breasts: defer Abdomen: Soft, +BS. Non tender, no guarding, rebound, hernias, masses, or organomegaly. .  Lymphatics: Non tender without lymphadenopathy.  Genitourinary: defer Musculoskeletal: Full ROM all peripheral extremities,5/5 strength, and normal gait. Skin: Warm, dry without rashes, lesions, ecchymosis.  Neuro: Cranial nerves intact, reflexes equal bilaterally. Normal muscle tone, no cerebellar symptoms. Sensation intact.  Psych: Awake and oriented X 3, normal affect, Insight and Judgment appropriate.   EKG: WNL no changes.  Assessment and Plan: Hyperlipidemia--continue medications, check lipids, decrease fatty foods, increase activity.   Hypertension- - continue medications, DASH diet, exercise and monitor at home. Call if greater than 130/80.   Allergic rhinitis- Allegra OTC, increase H20, allergy hygiene explained.  Vitamin D deficiency- continue supplements  Depression- in remission  Prediabetes-Discussed general issues about diabetes pathophysiology and management.,  Educational material distributed., Suggested low cholesterol diet., Encouraged aerobic exercise., Discussed foot care., Reminded to get yearly retinal exam.  PAP due 2017 Colon next year Health Maintenance   Discussed med's effects and SE's. Screening labs and tests as requested with regular follow-up as recommended.   Vicie Mutters 2:31 PM

## 2014-03-08 NOTE — Patient Instructions (Signed)

## 2014-03-09 LAB — BASIC METABOLIC PANEL WITH GFR
BUN: 16 mg/dL (ref 6–23)
CALCIUM: 9.4 mg/dL (ref 8.4–10.5)
CHLORIDE: 103 meq/L (ref 96–112)
CO2: 26 mEq/L (ref 19–32)
Creat: 0.59 mg/dL (ref 0.50–1.10)
GFR, Est African American: >89 mL/min
Glucose, Bld: 90 mg/dL (ref 70–99)
Potassium: 4.2 mEq/L (ref 3.5–5.3)
SODIUM: 140 meq/L (ref 135–145)

## 2014-03-09 LAB — URINALYSIS, ROUTINE W REFLEX MICROSCOPIC
Bilirubin Urine: NEGATIVE
Glucose, UA: NEGATIVE mg/dL
HGB URINE DIPSTICK: NEGATIVE
KETONES UR: NEGATIVE mg/dL
Nitrite: NEGATIVE
PH: 6 (ref 5.0–8.0)
Protein, ur: NEGATIVE mg/dL
SPECIFIC GRAVITY, URINE: 1.017 (ref 1.005–1.030)
Urobilinogen, UA: 0.2 mg/dL (ref 0.0–1.0)

## 2014-03-09 LAB — HEPATIC FUNCTION PANEL
ALBUMIN: 4.5 g/dL (ref 3.5–5.2)
ALK PHOS: 96 U/L (ref 39–117)
ALT: 16 U/L (ref 0–35)
AST: 17 U/L (ref 0–37)
Bilirubin, Direct: 0.1 mg/dL (ref 0.0–0.3)
Indirect Bilirubin: 0.1 mg/dL — ABNORMAL LOW (ref 0.2–1.2)
TOTAL PROTEIN: 7.2 g/dL (ref 6.0–8.3)
Total Bilirubin: 0.2 mg/dL (ref 0.2–1.2)

## 2014-03-09 LAB — LIPID PANEL
CHOLESTEROL: 124 mg/dL (ref 0–200)
HDL: 38 mg/dL — ABNORMAL LOW (ref >39)
LDL CALC: 59 mg/dL (ref 0–99)
Total CHOL/HDL Ratio: 3.3 Ratio
Triglycerides: 137 mg/dL (ref <150)
VLDL: 27 mg/dL (ref 0–40)

## 2014-03-09 LAB — MICROALBUMIN / CREATININE URINE RATIO
CREATININE, URINE: 111.9 mg/dL
MICROALB/CREAT RATIO: 6.4 mg/g (ref 0.0–30.0)
Microalb, Ur: 0.72 mg/dL (ref 0.00–1.89)

## 2014-03-09 LAB — URINALYSIS, MICROSCOPIC ONLY
BACTERIA UA: NONE SEEN
Casts: NONE SEEN
Crystals: NONE SEEN
Squamous Epithelial / LPF: NONE SEEN

## 2014-03-09 LAB — MAGNESIUM: MAGNESIUM: 2 mg/dL (ref 1.5–2.5)

## 2014-03-09 LAB — IRON AND TIBC
%SAT: 19 % — ABNORMAL LOW (ref 20–55)
IRON: 68 ug/dL (ref 42–145)
TIBC: 349 ug/dL (ref 250–470)
UIBC: 281 ug/dL (ref 125–400)

## 2014-03-09 LAB — VITAMIN B12: Vitamin B-12: 553 pg/mL (ref 211–911)

## 2014-03-09 LAB — TSH: TSH: 0.687 u[IU]/mL (ref 0.350–4.500)

## 2014-03-09 LAB — FERRITIN: Ferritin: 91 ng/mL (ref 10–291)

## 2014-03-09 LAB — INSULIN, FASTING: Insulin fasting, serum: 59 u[IU]/mL — ABNORMAL HIGH (ref 3–28)

## 2014-03-09 LAB — VITAMIN D 25 HYDROXY (VIT D DEFICIENCY, FRACTURES): Vit D, 25-Hydroxy: 77 ng/mL (ref 30–89)

## 2014-03-11 ENCOUNTER — Other Ambulatory Visit: Payer: Self-pay | Admitting: Internal Medicine

## 2014-03-25 ENCOUNTER — Other Ambulatory Visit: Payer: Self-pay | Admitting: Physician Assistant

## 2014-03-26 ENCOUNTER — Other Ambulatory Visit: Payer: Self-pay | Admitting: Internal Medicine

## 2014-03-26 DIAGNOSIS — R921 Mammographic calcification found on diagnostic imaging of breast: Secondary | ICD-10-CM

## 2014-05-03 ENCOUNTER — Ambulatory Visit
Admission: RE | Admit: 2014-05-03 | Discharge: 2014-05-03 | Disposition: A | Discharge status: Home / Self Care | Source: Ambulatory Visit | Attending: Internal Medicine | Admitting: Internal Medicine

## 2014-05-03 DIAGNOSIS — R921 Mammographic calcification found on diagnostic imaging of breast: Secondary | ICD-10-CM

## 2014-05-14 ENCOUNTER — Other Ambulatory Visit: Payer: Self-pay | Admitting: Physician Assistant

## 2014-07-05 ENCOUNTER — Ambulatory Visit (INDEPENDENT_AMBULATORY_CARE_PROVIDER_SITE_OTHER): Admitting: Physician Assistant

## 2014-07-05 ENCOUNTER — Ambulatory Visit: Payer: Self-pay | Admitting: Emergency Medicine

## 2014-07-05 ENCOUNTER — Encounter: Payer: Self-pay | Admitting: Physician Assistant

## 2014-07-05 VITALS — BP 122/84 | HR 72 | Temp 98.1°F | Resp 16 | Wt 190.0 lb

## 2014-07-05 DIAGNOSIS — I10 Essential (primary) hypertension: Secondary | ICD-10-CM

## 2014-07-05 DIAGNOSIS — E559 Vitamin D deficiency, unspecified: Secondary | ICD-10-CM

## 2014-07-05 DIAGNOSIS — R7303 Prediabetes: Secondary | ICD-10-CM

## 2014-07-05 DIAGNOSIS — R7309 Other abnormal glucose: Secondary | ICD-10-CM

## 2014-07-05 DIAGNOSIS — Z79899 Other long term (current) drug therapy: Secondary | ICD-10-CM

## 2014-07-05 DIAGNOSIS — E785 Hyperlipidemia, unspecified: Secondary | ICD-10-CM

## 2014-07-05 NOTE — Progress Notes (Signed)
Assessment and Plan:  Hypertension: Continue medication, monitor blood pressure at home. Continue DASH diet. Cholesterol: Continue diet and exercise. Check cholesterol.  Pre-diabetes-Continue diet and exercise. Check A1C Vitamin D Def- check level and continue medications.   Continue diet and meds as discussed. Further disposition pending results of labs.  HPI 58 y.o. female  presents for 3 month follow up with hypertension, hyperlipidemia, prediabetes and vitamin D. Her blood pressure has been controlled at home, today their BP is BP: 122/84 mmHg She does not workout. She denies chest pain, shortness of breath, dizziness.  She is on cholesterol medication and denies myalgias. Her cholesterol is at goal. The cholesterol last visit was:   Lab Results  Component Value Date   CHOL 124 03/08/2014   HDL 38* 03/08/2014   LDLCALC 59 03/08/2014   TRIG 137 03/08/2014   CHOLHDL 3.3 03/08/2014   She has been working on diet and exercise for prediabetes, and denies nausea, paresthesia of the feet and polydipsia. Last A1C in the office was:  Lab Results  Component Value Date   HGBA1C 5.8* 03/08/2014   Patient is on Vitamin D supplement.   Lab Results  Component Value Date   VD25OH 77 03/08/2014     She had recent right heel spur removal, she is in a boot currently and doing well.   Current Medications:  Current Outpatient Prescriptions on File Prior to Visit  Medication Sig Dispense Refill  . Cholecalciferol (VITAMIN D PO) Take 5,000 Int'l Units by mouth.      Marland Kitchen FLUoxetine (PROZAC) 20 MG capsule TAKE 3 CAPSULES (60 MG) DAILY FOR MOOD  270 capsule  1  . loratadine (CLARITIN) 10 MG tablet TAKE 1 TABLET DAILY FOR ALLERGIES  90 tablet  2  . meloxicam (MOBIC) 7.5 MG tablet Take 1 tablet (7.5 mg total) by mouth daily.  90 tablet  1  . montelukast (SINGULAIR) 10 MG tablet TAKE 1 TABLET DAILY  90 tablet  2  . simvastatin (ZOCOR) 40 MG tablet TAKE 1 TABLET AT BEDTIME (NEED OFFICE VISIT)  90 tablet  0   No  current facility-administered medications on file prior to visit.   Medical History:  Past Medical History  Diagnosis Date  . Hyperlipidemia   . Hypertension   . Allergy   . Vitamin D deficiency   . Depression   . Prediabetes 11/09/2013  . Obesity (BMI 30.0-34.9)    Allergies: No Known Allergies   Review of Systems: [X]  = complains of  [ ]  = denies  General: Fatigue [ ]  Fever [ ]  Chills [ ]  Weakness [ ]   Insomnia [ ]  Eyes: Redness [ ]  Blurred vision [ ]  Diplopia [ ]   ENT: Congestion [ ]  Sinus Pain [ ]  Post Nasal Drip [ ]  Sore Throat [ ]  Earache [ ]   Cardiac: Chest pain/pressure [ ]  SOB [ ]  Orthopnea [ ]   Palpitations [ ]   Paroxysmal nocturnal dyspnea[ ]  Claudication [ ]  Edema [ ]   Pulmonary: Cough [ ]  Wheezing[ ]   SOB [ ]   Snoring [ ]   GI: Nausea [ ]  Vomiting[ ]  Dysphagia[ ]  Heartburn[ ]  Abdominal pain [ ]  Constipation [ ] ; Diarrhea [ ] ; BRBPR [ ]  Melena[ ]  GU: Hematuria[ ]  Dysuria [ ]  Nocturia[ ]  Urgency [ ]   Hesitancy [ ]  Discharge [ ]  Neuro: Headaches[ ]  Vertigo[ ]  Paresthesias[ ]  Spasm [ ]  Speech changes [ ]  Incoordination [ ]   Ortho: Arthritis [ x] Joint pain [x ] Muscle pain [ ]  Joint  swelling [ ]  Back Pain [ ]  Skin:  Rash [ ]   Pruritis [ ]  Change in skin lesion [ ]   Psych: Depression[ ]  Anxiety[ ]  Confusion [ ]  Memory loss [ ]   Heme/Lypmh: Bleeding [ ]  Bruising [ ]  Enlarged lymph nodes [ ]   Endocrine: Visual blurring [ ]  Paresthesia [ ]  Polyuria [ ]  Polydypsea [ ]    Heat/cold intolerance [ ]  Hypoglycemia [ ]   Family history- Review and unchanged Social history- Review and unchanged Physical Exam: BP 122/84  Pulse 72  Temp(Src) 98.1 F (36.7 C)  Resp 16  Wt 190 lb (86.183 kg) Wt Readings from Last 3 Encounters:  07/05/14 190 lb (86.183 kg)  03/08/14 202 lb (91.627 kg)  12/27/13 190 lb (86.183 kg)   General Appearance: Well nourished, in no apparent distress. Eyes: PERRLA, EOMs, conjunctiva no swelling or erythema Sinuses: No Frontal/maxillary  tenderness ENT/Mouth: Ext aud canals clear, TMs without erythema, bulging. No erythema, swelling, or exudate on post pharynx.  Tonsils not swollen or erythematous. Hearing normal.  Neck: Supple, thyroid normal.  Respiratory: Respiratory effort normal, BS equal bilaterally without rales, rhonchi, wheezing or stridor.  Cardio: RRR with no MRGs. Brisk peripheral pulses without edema.  Abdomen: Soft, + BS.  Non tender, no guarding, rebound, hernias, masses. Lymphatics: Non tender without lymphadenopathy.  Musculoskeletal: Full ROM, 5/5 strength, normal gait.  Skin: Warm, dry without rashes, lesions, ecchymosis.  Neuro: Cranial nerves intact. Normal muscle tone, no cerebellar symptoms. Sensation intact.  Psych: Awake and oriented X 3, normal affect, Insight and Judgment appropriate.    Vicie Mutters 4:36 PM

## 2014-07-05 NOTE — Patient Instructions (Signed)

## 2014-07-06 LAB — CBC WITH DIFFERENTIAL/PLATELET
BASOS ABS: 0.1 10*3/uL (ref 0.0–0.1)
Basophils Relative: 1 % (ref 0–1)
EOS PCT: 5 % (ref 0–5)
Eosinophils Absolute: 0.5 10*3/uL (ref 0.0–0.7)
HCT: 40.9 % (ref 36.0–46.0)
Hemoglobin: 14.1 g/dL (ref 12.0–15.0)
LYMPHS PCT: 30 % (ref 12–46)
Lymphs Abs: 2.8 10*3/uL (ref 0.7–4.0)
MCH: 29.7 pg (ref 26.0–34.0)
MCHC: 34.5 g/dL (ref 30.0–36.0)
MCV: 86.3 fL (ref 78.0–100.0)
Monocytes Absolute: 0.6 10*3/uL (ref 0.1–1.0)
Monocytes Relative: 6 % (ref 3–12)
NEUTROS ABS: 5.5 10*3/uL (ref 1.7–7.7)
Neutrophils Relative %: 58 % (ref 43–77)
PLATELETS: 343 10*3/uL (ref 150–400)
RBC: 4.74 MIL/uL (ref 3.87–5.11)
RDW: 13.5 % (ref 11.5–15.5)
WBC: 9.4 10*3/uL (ref 4.0–10.5)

## 2014-07-06 LAB — BASIC METABOLIC PANEL WITH GFR
BUN: 13 mg/dL (ref 6–23)
CO2: 23 mEq/L (ref 19–32)
Calcium: 9.3 mg/dL (ref 8.4–10.5)
Chloride: 104 mEq/L (ref 96–112)
Creat: 0.72 mg/dL (ref 0.50–1.10)
GFR, Est African American: >89 mL/min
GLUCOSE: 88 mg/dL (ref 70–99)
POTASSIUM: 4.4 meq/L (ref 3.5–5.3)
SODIUM: 140 meq/L (ref 135–145)

## 2014-07-06 LAB — HEPATIC FUNCTION PANEL
ALT: 14 U/L (ref 0–35)
AST: 14 U/L (ref 0–37)
Albumin: 4.4 g/dL (ref 3.5–5.2)
Alkaline Phosphatase: 91 U/L (ref 39–117)
BILIRUBIN DIRECT: 0.1 mg/dL (ref 0.0–0.3)
BILIRUBIN INDIRECT: 0.2 mg/dL (ref 0.2–1.2)
Total Bilirubin: 0.3 mg/dL (ref 0.2–1.2)
Total Protein: 7.2 g/dL (ref 6.0–8.3)

## 2014-07-06 LAB — VITAMIN D 25 HYDROXY (VIT D DEFICIENCY, FRACTURES): Vit D, 25-Hydroxy: 83 ng/mL (ref 30–89)

## 2014-07-06 LAB — LIPID PANEL
Cholesterol: 154 mg/dL (ref 0–200)
HDL: 40 mg/dL (ref >39)
LDL CALC: 83 mg/dL (ref 0–99)
TRIGLYCERIDES: 157 mg/dL — AB (ref <150)
Total CHOL/HDL Ratio: 3.9 Ratio
VLDL: 31 mg/dL (ref 0–40)

## 2014-07-06 LAB — HEMOGLOBIN A1C
Hgb A1c MFr Bld: 6 % — ABNORMAL HIGH (ref <5.7)
Mean Plasma Glucose: 126 mg/dL — ABNORMAL HIGH (ref <117)

## 2014-07-06 LAB — TSH: TSH: 0.993 u[IU]/mL (ref 0.350–4.500)

## 2014-07-06 LAB — INSULIN, FASTING: Insulin fasting, serum: 36 u[IU]/mL — ABNORMAL HIGH (ref 3–28)

## 2014-07-06 LAB — MAGNESIUM: Magnesium: 2 mg/dL (ref 1.5–2.5)

## 2014-07-25 ENCOUNTER — Other Ambulatory Visit: Payer: Self-pay | Admitting: Physician Assistant

## 2014-08-17 ENCOUNTER — Other Ambulatory Visit: Payer: Self-pay | Admitting: Emergency Medicine

## 2014-09-12 ENCOUNTER — Other Ambulatory Visit: Payer: Self-pay | Admitting: Emergency Medicine

## 2014-10-04 ENCOUNTER — Encounter: Payer: Self-pay | Admitting: Physician Assistant

## 2014-10-04 ENCOUNTER — Ambulatory Visit (INDEPENDENT_AMBULATORY_CARE_PROVIDER_SITE_OTHER): Admitting: Physician Assistant

## 2014-10-04 VITALS — BP 122/80 | HR 76 | Temp 97.9°F | Resp 16 | Ht 65.0 in | Wt 204.0 lb

## 2014-10-04 DIAGNOSIS — F329 Major depressive disorder, single episode, unspecified: Secondary | ICD-10-CM

## 2014-10-04 DIAGNOSIS — E669 Obesity, unspecified: Secondary | ICD-10-CM

## 2014-10-04 DIAGNOSIS — E559 Vitamin D deficiency, unspecified: Secondary | ICD-10-CM

## 2014-10-04 DIAGNOSIS — Z79899 Other long term (current) drug therapy: Secondary | ICD-10-CM

## 2014-10-04 DIAGNOSIS — E785 Hyperlipidemia, unspecified: Secondary | ICD-10-CM

## 2014-10-04 DIAGNOSIS — I10 Essential (primary) hypertension: Secondary | ICD-10-CM

## 2014-10-04 DIAGNOSIS — R7309 Other abnormal glucose: Secondary | ICD-10-CM

## 2014-10-04 DIAGNOSIS — Z23 Encounter for immunization: Secondary | ICD-10-CM

## 2014-10-04 DIAGNOSIS — R7303 Prediabetes: Secondary | ICD-10-CM

## 2014-10-04 DIAGNOSIS — F32A Depression, unspecified: Secondary | ICD-10-CM

## 2014-10-04 LAB — CBC WITH DIFFERENTIAL/PLATELET
BASOS ABS: 0.1 10*3/uL (ref 0.0–0.1)
Basophils Relative: 1 % (ref 0–1)
EOS PCT: 5 % (ref 0–5)
Eosinophils Absolute: 0.4 10*3/uL (ref 0.0–0.7)
HEMATOCRIT: 42 % (ref 36.0–46.0)
Hemoglobin: 14 g/dL (ref 12.0–15.0)
LYMPHS PCT: 25 % (ref 12–46)
Lymphs Abs: 2.2 10*3/uL (ref 0.7–4.0)
MCH: 29.5 pg (ref 26.0–34.0)
MCHC: 33.3 g/dL (ref 30.0–36.0)
MCV: 88.6 fL (ref 78.0–100.0)
MONO ABS: 0.8 10*3/uL (ref 0.1–1.0)
MONOS PCT: 9 % (ref 3–12)
Neutro Abs: 5.2 10*3/uL (ref 1.7–7.7)
Neutrophils Relative %: 60 % (ref 43–77)
Platelets: 329 10*3/uL (ref 150–400)
RBC: 4.74 MIL/uL (ref 3.87–5.11)
RDW: 13.4 % (ref 11.5–15.5)
WBC: 8.7 10*3/uL (ref 4.0–10.5)

## 2014-10-04 NOTE — Progress Notes (Signed)
Assessment and Plan:  Hypertension: Continue medication, monitor blood pressure at home. Continue DASH diet.  Reminder to go to the ER if any CP, SOB, nausea, dizziness, severe HA, changes vision/speech, left arm numbness and tingling, and jaw pain. Cholesterol: Continue diet and exercise. Check cholesterol.  Pre-diabetes-Continue diet and exercise. Check A1C Vitamin D Def- check level and continue medications.   Continue diet and meds as discussed. Further disposition pending results of labs.  HPI 58 y.o. female  presents for 3 month follow up with hypertension, hyperlipidemia, prediabetes and vitamin D. Her blood pressure has been controlled at home, today their BP is BP: 122/80 mmHg She does not workout, she has gone back to work for 3 weeks since having heel spur removal with Dr. Sharol Given. She denies chest pain, shortness of breath, dizziness.  She is on cholesterol medication and denies myalgias. Her cholesterol is at goal. The cholesterol last visit was:   Lab Results  Component Value Date   CHOL 154 07/05/2014   HDL 40 07/05/2014   LDLCALC 83 07/05/2014   TRIG 157* 07/05/2014   CHOLHDL 3.9 07/05/2014   She has been working on diet and exercise for prediabetes, and denies paresthesia of the feet, polydipsia, polyuria and visual disturbances. Last A1C in the office was:  Lab Results  Component Value Date   HGBA1C 6.0* 07/05/2014   Patient is on Vitamin D supplement.   Lab Results  Component Value Date   VD25OH 83 07/05/2014     BMI is Body mass index is 33.95 kg/(m^2)., she is struggling with weight loss due to time limitations. Wt Readings from Last 3 Encounters:  10/04/14 204 lb (92.534 kg)  07/05/14 190 lb (86.183 kg)  03/08/14 202 lb (91.627 kg)    Current Medications:  Current Outpatient Prescriptions on File Prior to Visit  Medication Sig Dispense Refill  . Cholecalciferol (VITAMIN D PO) Take 5,000 Int'l Units by mouth.      Marland Kitchen FLUoxetine (PROZAC) 20 MG capsule TAKE 3  CAPSULES (60 MG) DAILY FOR MOOD  270 capsule  0  . loratadine (CLARITIN) 10 MG tablet TAKE 1 TABLET DAILY FOR ALLERGIES  90 tablet  2  . meloxicam (MOBIC) 7.5 MG tablet Take 1 tablet (7.5 mg total) by mouth daily.  90 tablet  1  . montelukast (SINGULAIR) 10 MG tablet TAKE 1 TABLET DAILY  90 tablet  1  . simvastatin (ZOCOR) 40 MG tablet TAKE 1 TABLET AT BEDTIME (NEED OFFICE VISIT)  90 tablet  2   No current facility-administered medications on file prior to visit.   Medical History:  Past Medical History  Diagnosis Date  . Hyperlipidemia   . Hypertension   . Allergy   . Vitamin D deficiency   . Depression   . Prediabetes 11/09/2013  . Obesity (BMI 30.0-34.9)    Allergies: No Known Allergies   Review of Systems: [X]  = complains of  [ ]  = denies  General: Fatigue [ ]  Fever [ ]  Chills [ ]  Weakness [ ]   Insomnia [ ]  Eyes: Redness [ ]  Blurred vision [ ]  Diplopia [ ]   ENT: Congestion [ ]  Sinus Pain [ ]  Post Nasal Drip [ ]  Sore Throat [ ]  Earache [ ]   Cardiac: Chest pain/pressure [ ]  SOB [ ]  Orthopnea [ ]   Palpitations [ ]   Paroxysmal nocturnal dyspnea[ ]  Claudication [ ]  Edema [ ]   Pulmonary: Cough [ ]  Wheezing[ ]   SOB [ ]   Snoring [ ]   GI: Nausea [ ]   Vomiting[ ]  Dysphagia[ ]  Heartburn[ ]  Abdominal pain [ ]  Constipation [ ] ; Diarrhea [ ] ; BRBPR [ ]  Melena[ ]  GU: Hematuria[ ]  Dysuria [ ]  Nocturia[ ]  Urgency [ ]   Hesitancy [ ]  Discharge [ ]  Neuro: Headaches[ ]  Vertigo[ ]  Paresthesias[ ]  Spasm [ ]  Speech changes [ ]  Incoordination [ ]   Ortho: Arthritis [ ]  Joint pain [ ]  Muscle pain [ ]  Joint swelling [ ]  Back Pain [ ]  Skin:  Rash [ ]   Pruritis [ ]  Change in skin lesion [ ]   Psych: Depression[ ]  Anxiety[ ]  Confusion [ ]  Memory loss [ ]   Heme/Lypmh: Bleeding [ ]  Bruising [ ]  Enlarged lymph nodes [ ]   Endocrine: Visual blurring [ ]  Paresthesia [ ]  Polyuria [ ]  Polydypsea [ ]    Heat/cold intolerance [ ]  Hypoglycemia [ ]   Family history- Review and unchanged Social history- Review and  unchanged Physical Exam: BP 122/80  Pulse 76  Temp(Src) 97.9 F (36.6 C)  Resp 16  Ht 5\' 5"  (1.651 m)  Wt 204 lb (92.534 kg)  BMI 33.95 kg/m2 Wt Readings from Last 3 Encounters:  10/04/14 204 lb (92.534 kg)  07/05/14 190 lb (86.183 kg)  03/08/14 202 lb (91.627 kg)   General Appearance: Well nourished, in no apparent distress. Eyes: PERRLA, EOMs, conjunctiva no swelling or erythema Sinuses: No Frontal/maxillary tenderness ENT/Mouth: Ext aud canals clear, TMs without erythema, bulging. No erythema, swelling, or exudate on post pharynx.  Tonsils not swollen or erythematous. Hearing normal.  Neck: Supple, thyroid normal.  Respiratory: Respiratory effort normal, BS equal bilaterally without rales, rhonchi, wheezing or stridor.  Cardio: RRR with no MRGs. Brisk peripheral pulses without edema.  Abdomen: Soft, + BS.  Non tender, no guarding, rebound, hernias, masses. Lymphatics: Non tender without lymphadenopathy.  Musculoskeletal: Full ROM, 5/5 strength, normal gait.  Skin: Warm, dry without rashes, lesions, ecchymosis.  Neuro: Cranial nerves intact. Normal muscle tone, no cerebellar symptoms. Sensation intact.  Psych: Awake and oriented X 3, normal affect, Insight and Judgment appropriate.    Vicie Mutters, PA-C 4:41 PM [facility]

## 2014-10-04 NOTE — Patient Instructions (Signed)
   Bad carbs also include fruit juice, alcohol, and sweet tea. These are empty calories that do not signal to your brain that you are full.   Please remember the good carbs are still carbs which convert into sugar. So please measure them out no more than 1/2-1 cup of rice, oatmeal, pasta, and beans.  Veggies are however free foods! Pile them on.   No all fruit is created equal. Please see the list below, the fruit at the bottom is higher in sugars than the fruit at the top   Can get Dr. Roda Shutters book, "the End of Dieting."

## 2014-10-05 LAB — HEPATIC FUNCTION PANEL
ALBUMIN: 4.6 g/dL (ref 3.5–5.2)
ALT: 16 U/L (ref 0–35)
AST: 15 U/L (ref 0–37)
Alkaline Phosphatase: 110 U/L (ref 39–117)
Bilirubin, Direct: 0.1 mg/dL (ref 0.0–0.3)
Indirect Bilirubin: 0.1 mg/dL — ABNORMAL LOW (ref 0.2–1.2)
Total Bilirubin: 0.2 mg/dL (ref 0.2–1.2)
Total Protein: 7.2 g/dL (ref 6.0–8.3)

## 2014-10-05 LAB — BASIC METABOLIC PANEL WITH GFR
BUN: 12 mg/dL (ref 6–23)
CO2: 28 mEq/L (ref 19–32)
CREATININE: 0.73 mg/dL (ref 0.50–1.10)
Calcium: 9.1 mg/dL (ref 8.4–10.5)
Chloride: 104 mEq/L (ref 96–112)
GFR, Est African American: >89 mL/min
GFR, Est Non African American: >89 mL/min
GLUCOSE: 76 mg/dL (ref 70–99)
Potassium: 4.2 mEq/L (ref 3.5–5.3)
Sodium: 140 mEq/L (ref 135–145)

## 2014-10-05 LAB — TSH: TSH: 0.44 u[IU]/mL (ref 0.350–4.500)

## 2014-10-05 LAB — LIPID PANEL
Cholesterol: 135 mg/dL (ref 0–200)
HDL: 37 mg/dL — ABNORMAL LOW (ref >39)
LDL Cholesterol: 74 mg/dL (ref 0–99)
Total CHOL/HDL Ratio: 3.6 Ratio
Triglycerides: 122 mg/dL (ref <150)
VLDL: 24 mg/dL (ref 0–40)

## 2014-10-05 LAB — MAGNESIUM: Magnesium: 2.2 mg/dL (ref 1.5–2.5)

## 2014-10-05 LAB — INSULIN, FASTING: Insulin fasting, serum: 36.3 u[IU]/mL — ABNORMAL HIGH (ref 2.0–19.6)

## 2014-10-05 LAB — VITAMIN D 25 HYDROXY (VIT D DEFICIENCY, FRACTURES): VIT D 25 HYDROXY: 77 ng/mL (ref 30–89)

## 2014-10-05 LAB — HEMOGLOBIN A1C
HEMOGLOBIN A1C: 6 % — AB (ref <5.7)
Mean Plasma Glucose: 126 mg/dL — ABNORMAL HIGH (ref <117)

## 2014-10-28 ENCOUNTER — Other Ambulatory Visit: Payer: Self-pay | Admitting: Physician Assistant

## 2014-10-30 ENCOUNTER — Other Ambulatory Visit: Payer: Self-pay | Admitting: Physician Assistant

## 2014-11-02 ENCOUNTER — Other Ambulatory Visit: Payer: Self-pay | Admitting: Physician Assistant

## 2015-02-01 ENCOUNTER — Other Ambulatory Visit: Payer: Self-pay | Admitting: Internal Medicine

## 2015-03-14 ENCOUNTER — Other Ambulatory Visit: Payer: Self-pay | Admitting: Internal Medicine

## 2015-03-14 ENCOUNTER — Encounter: Payer: Self-pay | Admitting: Physician Assistant

## 2015-03-28 ENCOUNTER — Ambulatory Visit (INDEPENDENT_AMBULATORY_CARE_PROVIDER_SITE_OTHER): Admitting: Physician Assistant

## 2015-03-28 ENCOUNTER — Encounter: Payer: Self-pay | Admitting: Physician Assistant

## 2015-03-28 VITALS — BP 142/82 | HR 68 | Temp 97.7°F | Resp 16 | Ht 65.0 in | Wt 199.0 lb

## 2015-03-28 DIAGNOSIS — R7303 Prediabetes: Secondary | ICD-10-CM

## 2015-03-28 DIAGNOSIS — T7840XA Allergy, unspecified, initial encounter: Secondary | ICD-10-CM

## 2015-03-28 DIAGNOSIS — R7309 Other abnormal glucose: Secondary | ICD-10-CM

## 2015-03-28 DIAGNOSIS — L309 Dermatitis, unspecified: Secondary | ICD-10-CM

## 2015-03-28 DIAGNOSIS — E785 Hyperlipidemia, unspecified: Secondary | ICD-10-CM

## 2015-03-28 DIAGNOSIS — Z79899 Other long term (current) drug therapy: Secondary | ICD-10-CM

## 2015-03-28 DIAGNOSIS — R6889 Other general symptoms and signs: Secondary | ICD-10-CM

## 2015-03-28 DIAGNOSIS — Z0001 Encounter for general adult medical examination with abnormal findings: Secondary | ICD-10-CM

## 2015-03-28 DIAGNOSIS — I10 Essential (primary) hypertension: Secondary | ICD-10-CM

## 2015-03-28 DIAGNOSIS — Z23 Encounter for immunization: Secondary | ICD-10-CM

## 2015-03-28 DIAGNOSIS — F329 Major depressive disorder, single episode, unspecified: Secondary | ICD-10-CM

## 2015-03-28 DIAGNOSIS — F32A Depression, unspecified: Secondary | ICD-10-CM

## 2015-03-28 DIAGNOSIS — E669 Obesity, unspecified: Secondary | ICD-10-CM

## 2015-03-28 DIAGNOSIS — E559 Vitamin D deficiency, unspecified: Secondary | ICD-10-CM

## 2015-03-28 LAB — CBC WITH DIFFERENTIAL/PLATELET
Basophils Absolute: 0.1 10*3/uL (ref 0.0–0.1)
Basophils Relative: 1 % (ref 0–1)
EOS PCT: 6 % — AB (ref 0–5)
Eosinophils Absolute: 0.6 10*3/uL (ref 0.0–0.7)
HCT: 42.6 % (ref 36.0–46.0)
Hemoglobin: 14.3 g/dL (ref 12.0–15.0)
LYMPHS ABS: 2.6 10*3/uL (ref 0.7–4.0)
LYMPHS PCT: 26 % (ref 12–46)
MCH: 29.7 pg (ref 26.0–34.0)
MCHC: 33.6 g/dL (ref 30.0–36.0)
MCV: 88.6 fL (ref 78.0–100.0)
MONO ABS: 0.9 10*3/uL (ref 0.1–1.0)
MPV: 9.1 fL (ref 8.6–12.4)
Monocytes Relative: 9 % (ref 3–12)
Neutro Abs: 5.9 10*3/uL (ref 1.7–7.7)
Neutrophils Relative %: 58 % (ref 43–77)
Platelets: 312 10*3/uL (ref 150–400)
RBC: 4.81 MIL/uL (ref 3.87–5.11)
RDW: 13.4 % (ref 11.5–15.5)
WBC: 10.1 10*3/uL (ref 4.0–10.5)

## 2015-03-28 NOTE — Patient Instructions (Signed)
We want weight loss that will last so you should lose 1-2 pounds a week.  THAT IS IT! Please pick THREE things a month to change. Once it is a habit check off the item. Then pick another three items off the list to become habits.  If you are already doing a habit on the list GREAT!  Cross that item off! o Don't drink your calories. Ie, alcohol, soda, fruit juice, and sweet tea.  o Drink more water. Drink a glass when you feel hungry or before each meal.  o Eat breakfast - Complex carb and protein (likeDannon light and fit yogurt, oatmeal, fruit, eggs, turkey bacon). o Measure your cereal.  Eat no more than one cup a day. (ie Kashi) o Eat an apple a day. o Add a vegetable a day. o Try a new vegetable a month. o Use Pam! Stop using oil or butter to cook. o Don't finish your plate or use smaller plates. o Share your dessert. o Eat sugar free Jello for dessert or frozen grapes. o Don't eat 2-3 hours before bed. o Switch to whole wheat bread, pasta, and brown rice. o Make healthier choices when you eat out. No fries! o Pick baked chicken, NOT fried. o Don't forget to SLOW DOWN when you eat. It is not going anywhere.  o Take the stairs. o Park far away in the parking lot o Lift soup cans (or weights) for 10 minutes while watching TV. o Walk at work for 10 minutes during break. o Walk outside 1 time a week with your friend, kids, dog, or significant other. o Start a walking group at church. o Walk the mall as much as you can tolerate.  o Keep a food diary. o Weigh yourself daily. o Walk for 15 minutes 3 days per week. o Cook at home more often and eat out less.  If life happens and you go back to old habits, it is okay.  Just start over. You can do it!   If you experience chest pain, get short of breath, or tired during the exercise, please stop immediately and inform your doctor.   Before you even begin to attack a weight-loss plan, it pays to remember this: You are not fat. You have fat.  Losing weight isn't about blame or shame; it's simply another achievement to accomplish. Dieting is like any other skill-you have to buckle down and work at it. As long as you act in a smart, reasonable way, you'll ultimately get where you want to be. Here are some weight loss pearls for you.  1. It's Not a Diet. It's a Lifestyle Thinking of a diet as something you're on and suffering through only for the short term doesn't work. To shed weight and keep it off, you need to make permanent changes to the way you eat. It's OK to indulge occasionally, of course, but if you cut calories temporarily and then revert to your old way of eating, you'll gain back the weight quicker than you can say yo-yo. Use it to lose it. Research shows that one of the best predictors of long-term weight loss is how many pounds you drop in the first month. For that reason, nutritionists often suggest being stricter for the first two weeks of your new eating strategy to build momentum. Cut out added sugar and alcohol and avoid unrefined carbs. After that, figure out how you can reincorporate them in a way that's healthy and maintainable.  2. There's a Right   Way to Exercise Working out burns calories and fat and boosts your metabolism by building muscle. But those trying to lose weight are notorious for overestimating the number of calories they burn and underestimating the amount they take in. Unfortunately, your system is biologically programmed to hold on to extra pounds and that means when you start exercising, your body senses the deficit and ramps up its hunger signals. If you're not diligent, you'll eat everything you burn and then some. Use it to lose it. Cardio gets all the exercise glory, but strength and interval training are the real heroes. They help you build lean muscle, which in turn increases your metabolism and calorie-burning ability 3. Don't Overreact to Mild Hunger Some people have a hard time losing weight because  of hunger anxiety. To them, being hungry is bad-something to be avoided at all costs-so they carry snacks with them and eat when they don't need to. Others eat because they're stressed out or bored. While you never want to get to the point of being ravenous (that's when bingeing is likely to happen), a hunger pang, a craving, or the fact that it's 3:00 p.m. should not send you racing for the vending machine or obsessing about the energy bar in your purse. Ideally, you should put off eating until your stomach is growling and it's difficult to concentrate.  Use it to lose it. When you feel the urge to eat, use the HALT method. Ask yourself, Am I really hungry? Or am I angry or anxious, lonely or bored, or tired? If you're still not certain, try the apple test. If you're truly hungry, an apple should seem delicious; if it doesn't, something else is going on. Or you can try drinking water and making yourself busy, if you are still hungry try a healthy snack.  4. Not All Calories Are Created Equal The mechanics of weight loss are pretty simple: Take in fewer calories than you use for energy. But the kind of food you eat makes all the difference. Processed food that's high in saturated fat and refined starch or sugar can cause inflammation that disrupts the hormone signals that tell your brain you're full. The result: You eat a lot more.  Use it to lose it. Clean up your diet. Swap in whole, unprocessed foods, including vegetables, lean protein, and healthy fats that will fill you up and give you the biggest nutritional bang for your calorie buck. In a few weeks, as your brain starts receiving regular hunger and fullness signals once again, you'll notice that you feel less hungry overall and naturally start cutting back on the amount you eat.  5. Protein, Produce, and Plant-Based Fats Are Your Weight-Loss Trinity Here's why eating the three Ps regularly will help you drop pounds. Protein fills you up. You need it  to build lean muscle, which keeps your metabolism humming so that you can torch more fat. People in a weight-loss program who ate double the recommended daily allowance for protein (about 110 grams for a 150-pound woman) lost 70 percent of their weight from fat, while people who ate the RDA lost only about 40 percent, one study found. Produce is packed with filling fiber. "It's very difficult to consume too many calories if you're eating a lot of vegetables. Example: Three cups of broccoli is a lot of food, yet only 93 calories. (Fruit is another story. It can be easy to overeat and can contain a lot of calories from sugar, so be sure to   monitor your intake.) Plant-based fats like olive oil and those in avocados and nuts are healthy and extra satiating.  Use it to lose it. Aim to incorporate each of the three Ps into every meal and snack. People who eat protein throughout the day are able to keep weight off, according to a study in the American Journal of Clinical Nutrition. In addition to meat, poultry and seafood, good sources are beans, lentils, eggs, tofu, and yogurt. As for fat, keep portion sizes in check by measuring out salad dressing, oil, and nut butters (shoot for one to two tablespoons). Finally, eat veggies or a little fruit at every meal. People who did that consumed 308 fewer calories but didn't feel any hungrier than when they didn't eat more produce.  7. How You Eat Is As Important As What You Eat In order for your brain to register that you're full, you need to focus on what you're eating. Sit down whenever you eat, preferably at a table. Turn off the TV or computer, put down your phone, and look at your food. Smell it. Chew slowly, and don't put another bite on your fork until you swallow. When women ate lunch this attentively, they consumed 30 percent less when snacking later than those who listened to an audiobook at lunchtime, according to a study in the British Journal of Nutrition. 8.  Weighing Yourself Really Works The scale provides the best evidence about whether your efforts are paying off. Seeing the numbers tick up or down or stagnate is motivation to keep going-or to rethink your approach. A 2015 study at [employer] found that daily weigh-ins helped people lose more weight, keep it off, and maintain that loss, even after two years. Use it to lose it. Step on the scale at the same time every day for the best results. If your weight shoots up several pounds from one weigh-in to the next, don't freak out. Eating a lot of salt the night before or having your period is the likely culprit. The number should return to normal in a day or two. It's a steady climb that you need to do something about. 9. Too Much Stress and Too Little Sleep Are Your Enemies When you're tired and frazzled, your body cranks up the production of cortisol, the stress hormone that can cause carb cravings. Not getting enough sleep also boosts your levels of ghrelin, a hormone associated with hunger, while suppressing leptin, a hormone that signals fullness and satiety. People on a diet who slept only five and a half hours a night for two weeks lost 55 percent less fat and were hungrier than those who slept eight and a half hours, according to a study in the [employer] Journal. Use it to lose it. Prioritize sleep, aiming for seven hours or more a night, which research shows helps lower stress. And make sure you're getting quality zzz's. If a snoring spouse or a fidgety cat wakes you up frequently throughout the night, you may end up getting the equivalent of just four hours of sleep, according to a study from [employer]. Keep pets out of the bedroom, and use a white-noise app to drown out snoring. 10. You Will Hit a plateau-And You Can Bust Through It As you slim down, your body releases much less leptin, the fullness hormone.  If you're not strength training, start right now.  Building muscle can raise your metabolism to help you overcome a plateau. To keep your body challenged   and burning calories, incorporate new moves and more intense intervals into your workouts or add another sweat session to your weekly routine. Alternatively, cut an extra 100 calories or so a day from your diet. Now that you've lost weight, your body simply doesn't need as much fuel.   Ways to cut 100 calories  1. Eat your eggs with hot sauce OR salsa instead of cheese.  Eggs are great for breakfast, but many people consider eggs and cheese to be BFFs. Instead of cheese-1 oz. of cheddar has 114 calories-top your eggs with hot sauce, which contains no calories and helps with satiety and metabolism. Salsa is also a great option!!  2. Top your toast, waffles or pancakes with mashed berries instead of jelly or syrup. Half a cup of berries-fresh, frozen or thawed-has about 40 calories, compared with 2 tbsp. of maple syrup or jelly, which both have about 100 calories. The berries will also give you a good punch of fiber, which helps keep you full and satisfied and won't spike blood sugar quickly like the jelly or syrup. 3. Swap the non-fat latte for black coffee with a splash of half-and-half. Contrary to its name, that non-fat latte has 130 calories and a startling 19g of carbohydrates per 16 oz. serving. Replacing that 'light' drinkable dessert with a black coffee with a splash of half-and-half saves you more than 100 calories per 16 oz. serving. 4. Sprinkle salads with freeze-dried raspberries instead of dried cranberries. If you want a sweet addition to your nutritious salad, stay away from dried cranberries. They have a whopping 130 calories per  cup and 30g carbohydrates. Instead, sprinkle freeze-dried raspberries guilt-free and save more than 100 calories per  cup serving, adding 3g of belly-filling fiber. 5. Go for mustard in place of mayo on your sandwich. Mustard can add really nice flavor to any  sandwich, and there are tons of varieties, from spicy to honey. A serving of mayo is 95 calories, versus 10 calories in a serving of mustard. 6. Choose a DIY salad dressing instead of the store-bought kind. Mix Dijon or whole grain mustard with low-fat Kefir or red wine vinegar and garlic. 7. Use hummus as a spread instead of a dip. Use hummus as a spread on a high-fiber cracker or tortilla with a sandwich and save on calories without sacrificing taste. 8. Pick just one salad "accessory." Salad isn't automatically a calorie winner. It's easy to over-accessorize with toppings. Instead of topping your salad with nuts, avocado and cranberries (all three will clock in at 313 calories), just pick one. The next day, choose a different accessory, which will also keep your salad interesting. You don't wear all your jewelry every day, right? 9. Ditch the white pasta in favor of spaghetti squash. One cup of cooked spaghetti squash has about 40 calories, compared with traditional spaghetti, which comes with more than 200. Spaghetti squash is also nutrient-dense. It's a good source of fiber and Vitamins A and C, and it can be eaten just like you would eat pasta-with a great tomato sauce and turkey meatballs or with pesto, tofu and spinach, for example. 10. Dress up your chili, soups and stews with non-fat Greek yogurt instead of sour cream. Just a 'dollop' of sour cream can set you back 115 calories and a whopping 12g of fat-seven of which are of the artery-clogging variety. Added bonus: Greek yogurt is packed with muscle-building protein, calcium and B Vitamins. 11. Mash cauliflower instead of mashed potatoes. One cup of   traditional mashed potatoes-in all their creamy goodness-has more than 200 calories, compared to mashed cauliflower, which you can typically eat for less than 100 calories per 1 cup serving. Cauliflower is a great source of the antioxidant indole-3-carbinol (I3C), which may help reduce the risk of  some cancers, like breast cancer. 12. Ditch the ice cream sundae in favor of a Greek yogurt parfait. Instead of a cup of ice cream or fro-yo for dessert, try 1 cup of nonfat Greek yogurt topped with fresh berries and a sprinkle of cacao nibs. Both toppings are packed with antioxidants, which can help reduce cellular inflammation and oxidative damage. And the comparison is a no-brainer: One cup of ice cream has about 275 calories; one cup of frozen yogurt has about 230; and a cup of Greek yogurt has just 130, plus twice the protein, so you're less likely to return to the freezer for a second helping. 13. Put olive oil in a spray container instead of using it directly from the bottle. Each tablespoon of olive oil is 120 calories and 15g of fat. Use a mister instead of pouring it straight into the pan or onto a salad. This allows for portion control and will save you more than 100 calories. 14. When baking, substitute canned pumpkin for butter or oil. Canned pumpkin-not pumpkin pie mix-is loaded with Vitamin A, which is important for skin and eye health, as well as immunity. And the comparisons are pretty crazy:  cup of canned pumpkin has about 40 calories, compared to butter or oil, which has more than 800 calories. Yes, 800 calories. Applesauce and mashed banana can also serve as good substitutions for butter or oil, usually in a 1:1 ratio. 15. Top casseroles with high-fiber cereal instead of breadcrumbs. Breadcrumbs are typically made with white bread, while breakfast cereals contain 5-9g of fiber per serving. Not only will you save more than 150 calories per  cup serving, the swap will also keep you more full and you'll get a metabolism boost from the added fiber. 16. Snack on pistachios instead of macadamia nuts. Believe it or not, you get the same amount of calories from 35 pistachios (100 calories) as you would from only five macadamia nuts. 17. Chow down on kale chips rather than potato  chips. This is my favorite 'don't knock it 'till you try it' swap. Kale chips are so easy to make at home, and you can spice them up with a little grated parmesan or chili powder. Plus, they're a mere fraction of the calories of potato chips, but with the same crunch factor we crave so often. 18. Add seltzer and some fruit slices to your cocktail instead of soda or fruit juice. One cup of soda or fruit juice can pack on as much as 140 calories. Instead, use seltzer and fruit slices. The fruit provides valuable phytochemicals, such as flavonoids and anthocyanins, which help to combat cancer and stave off the aging process.  

## 2015-03-28 NOTE — Progress Notes (Signed)
Complete Physical  Assessment and Plan: 1. Essential hypertension - continue medications, DASH diet, exercise and monitor at home. Call if greater than 130/80.  - CBC with Differential/Platelet - BASIC METABOLIC PANEL WITH GFR - Hepatic function panel - TSH - Urinalysis, Routine w reflex microscopic - Microalbumin / creatinine urine ratio - EKG 12-Lead  2. Prediabetes Discussed general issues about diabetes pathophysiology and management., Educational material distributed., Suggested low cholesterol diet., Encouraged aerobic exercise., Discussed foot care., Reminded to get yearly retinal exam - Hemoglobin A1c - Insulin, fasting - LOW EXTREMITY NEUR EXAM DOCUM  3. Hyperlipidemia -continue medications, check lipids, decrease fatty foods, increase activity.  - Lipid panel  4. Vitamin D deficiency - Vit D  25 hydroxy (rtn osteoporosis monitoring)  5. Obesity Obesity with co morbidities- long discussion about weight loss, diet, and exercise  6. Depression Remission, continue prozac  7. Allergy, initial encounter Continue OTC allergy pills  8. Medication management - Magnesium  9. Nipple dermatitis Get MGM which is over due, likely can put corisone on it however get checked  10. Encounter for general adult medical examination with abnormal findings  11. Need for Tdap vaccination - Tdap vaccine greater than or equal to 7yo IM   Discussed med's effects and SE's. Screening labs and tests as requested with regular follow-up as recommended. Over 40 minutes of exam, counseling, chart review, and critical decision making was performed this visit.   HPI  59 y.o. female  presents for a complete physical.  Her blood pressure has been controlled at home, today their BP is BP: (!) 142/82 mmHg She does not workout. She denies chest pain, shortness of breath, dizziness.  She is on cholesterol medication, zocor 24m and denies myalgias. Her cholesterol is at goal. The cholesterol  last visit was:   Lab Results  Component Value Date   CHOL 135 10/04/2014   HDL 37* 10/04/2014   LDLCALC 74 10/04/2014   TRIG 122 10/04/2014   CHOLHDL 3.6 10/04/2014   She has been working on diet and exercise for prediabetes, and denies paresthesia of the feet, polydipsia, polyuria and visual disturbances. Last A1C in the office was:  Lab Results  Component Value Date   HGBA1C 6.0* 10/04/2014  Patient is on Vitamin D supplement.   Lab Results  Component Value Date   VD25OH 77 10/04/2014   She is on prozac 266m 3 tabs a day for depression which helps. She has been under a lot of stress, her dad has prostate CA with met to bone and is in hospice.  BMI is Body mass index is 33.12 kg/(m^2)., she is working on diet and exercise. Wt Readings from Last 3 Encounters:  03/28/15 199 lb (90.266 kg)  10/04/14 204 lb (92.534 kg)  07/05/14 190 lb (86.183 kg)     Current Medications:  Current Outpatient Prescriptions on File Prior to Visit  Medication Sig Dispense Refill  . Cholecalciferol (VITAMIN D PO) Take 5,000 Int'l Units by mouth.    . Marland KitchenLUoxetine (PROZAC) 20 MG capsule TAKE 3 CAPSULES (60 MG) DAILY FOR MOOD 270 capsule 99  . loratadine (CLARITIN) 10 MG tablet TAKE 1 TABLET DAILY FOR ALLERGIES 90 tablet 2  . meloxicam (MOBIC) 7.5 MG tablet TAKE 1 TABLET DAILY 90 tablet 99  . montelukast (SINGULAIR) 10 MG tablet TAKE 1 TABLET DAILY 90 tablet 0  . simvastatin (ZOCOR) 40 MG tablet TAKE 1 TABLET AT BEDTIME (NEED OFFICE VISIT) 90 tablet 1   No current facility-administered medications on file  prior to visit.   Health Maintenance:   Immunization History  Administered Date(s) Administered  . Influenza Split 08/31/2013, 10/04/2014   Tetanus: 2006 DUE Pneumovax: 1999 Flu vaccine: 09/2014 Zostavax: N/A Pap: 03/02/2013 neg due 2017 Diagnostic MGM: 10/09/2014- f/u 6 months DEXA: N/A Colonoscopy: 2011, Dr. Earlean Shawl due 2016 EGD:N/A LMP 2008 DEE- unknown  Patient Care Team: Unk Pinto, MD as PCP - General (Internal Medicine) Newt Minion, MD as Consulting Physician (Orthopedic Surgery) Richmond Campbell, MD as Consulting Physician (Gastroenterology)  Allergies: No Known Allergies Medical History:  Past Medical History  Diagnosis Date  . Hyperlipidemia   . Hypertension   . Allergy   . Vitamin D deficiency   . Depression   . Prediabetes 11/09/2013  . Obesity (BMI 30.0-34.9)    Surgical History:  Past Surgical History  Procedure Laterality Date  . Lumbar disc surgery  2004  . Tubal ligation    . Dilation and curettage of uterus    . Heel spur surgery Left 2015   Family History:  Family History  Problem Relation Age of Onset  . Cancer Mother 36    breast  . Hypertension Mother   . Diabetes Mother    Social History:  History  Substance Use Topics  . Smoking status: Never Smoker   . Smokeless tobacco: Never Used  . Alcohol Use: No   Review of Systems: Review of Systems  Constitutional: Negative.   HENT: Negative.   Eyes: Negative.   Respiratory: Negative.   Cardiovascular: Negative.   Gastrointestinal: Negative.   Genitourinary: Negative.   Musculoskeletal: Negative.   Skin: Negative.   Neurological: Negative.   Endo/Heme/Allergies: Negative.   Psychiatric/Behavioral: Negative.     Physical Exam: Estimated body mass index is 33.12 kg/(m^2) as calculated from the following:   Height as of this encounter: 5' 5"  (1.651 m).   Weight as of this encounter: 199 lb (90.266 kg). BP 142/82 mmHg  Pulse 68  Temp(Src) 97.7 F (36.5 C)  Resp 16  Ht 5' 5"  (1.651 m)  Wt 199 lb (90.266 kg)  BMI 33.12 kg/m2 General Appearance: Well nourished, in no apparent distress.  Eyes: PERRLA, EOMs, conjunctiva no swelling or erythema, normal fundi and vessels.  Sinuses: No Frontal/maxillary tenderness  ENT/Mouth: Ext aud canals clear, normal light reflex with TMs without erythema, bulging. Good dentition. No erythema, swelling, or exudate on post pharynx.  Tonsils not swollen or erythematous. Hearing normal.  Neck: Supple, thyroid normal. No bruits  Respiratory: Respiratory effort normal, BS equal bilaterally without rales, rhonchi, wheezing or stridor.  Cardio: RRR without murmurs, rubs or gallops. Brisk peripheral pulses without edema.  Chest: symmetric, with normal excursions and percussion.  Breasts: Symmetric, with mobile mass right breast, and left nipple at 12 oclock with 3x3cm erythematous scaly area, no nipple discharge, no retractions.  Abdomen: Soft, obese, nontender, no guarding, rebound,  masses, or organomegaly.+ ventral hernia and small umbilical hernia Lymphatics: Non tender without lymphadenopathy.  Genitourinary: defer Musculoskeletal: Full ROM all peripheral extremities,5/5 strength, and normal gait.  Skin: Warm, dry without rashes, lesions, ecchymosis. Neuro: Cranial nerves intact, reflexes equal bilaterally. Normal muscle tone, no cerebellar symptoms. Sensation intact.  Psych: Awake and oriented X 3, normal affect, Insight and Judgment appropriate.   EKG: WNL no changes.  Vicie Mutters 4:03 PM [facility]

## 2015-03-29 ENCOUNTER — Other Ambulatory Visit: Payer: Self-pay

## 2015-03-29 ENCOUNTER — Other Ambulatory Visit: Payer: Self-pay | Admitting: Internal Medicine

## 2015-03-29 DIAGNOSIS — Z803 Family history of malignant neoplasm of breast: Secondary | ICD-10-CM

## 2015-03-29 DIAGNOSIS — R921 Mammographic calcification found on diagnostic imaging of breast: Secondary | ICD-10-CM

## 2015-03-29 LAB — HEPATIC FUNCTION PANEL
ALK PHOS: 90 U/L (ref 39–117)
ALT: 12 U/L (ref 0–35)
AST: 14 U/L (ref 0–37)
Albumin: 4.2 g/dL (ref 3.5–5.2)
BILIRUBIN TOTAL: 0.2 mg/dL (ref 0.2–1.2)
Bilirubin, Direct: <0.1 mg/dL (ref 0.0–0.3)
TOTAL PROTEIN: 6.9 g/dL (ref 6.0–8.3)

## 2015-03-29 LAB — BASIC METABOLIC PANEL WITH GFR
BUN: 16 mg/dL (ref 6–23)
CHLORIDE: 104 meq/L (ref 96–112)
CO2: 27 mEq/L (ref 19–32)
CREATININE: 0.68 mg/dL (ref 0.50–1.10)
Calcium: 9 mg/dL (ref 8.4–10.5)
GFR, Est African American: >89 mL/min
GFR, Est Non African American: >89 mL/min
Glucose, Bld: 72 mg/dL (ref 70–99)
POTASSIUM: 4.1 meq/L (ref 3.5–5.3)
Sodium: 139 mEq/L (ref 135–145)

## 2015-03-29 LAB — URINALYSIS, ROUTINE W REFLEX MICROSCOPIC
Bilirubin Urine: NEGATIVE
Glucose, UA: NEGATIVE mg/dL
Hgb urine dipstick: NEGATIVE
Ketones, ur: NEGATIVE mg/dL
Nitrite: NEGATIVE
PROTEIN: NEGATIVE mg/dL
Specific Gravity, Urine: 1.015 (ref 1.005–1.030)
Urobilinogen, UA: 0.2 mg/dL (ref 0.0–1.0)
pH: 6 (ref 5.0–8.0)

## 2015-03-29 LAB — URINALYSIS, MICROSCOPIC ONLY
BACTERIA UA: NONE SEEN
CRYSTALS: NONE SEEN
Casts: NONE SEEN
SQUAMOUS EPITHELIAL / LPF: NONE SEEN

## 2015-03-29 LAB — MICROALBUMIN / CREATININE URINE RATIO
Creatinine, Urine: 101.3 mg/dL
MICROALB/CREAT RATIO: 6.9 mg/g (ref 0.0–30.0)
Microalb, Ur: 0.7 mg/dL (ref <2.0)

## 2015-03-29 LAB — LIPID PANEL
Cholesterol: 133 mg/dL (ref 0–200)
HDL: 37 mg/dL — ABNORMAL LOW (ref >=46)
LDL CALC: 75 mg/dL (ref 0–99)
Total CHOL/HDL Ratio: 3.6 Ratio
Triglycerides: 106 mg/dL (ref <150)
VLDL: 21 mg/dL (ref 0–40)

## 2015-03-29 LAB — INSULIN, FASTING: Insulin fasting, serum: 33.4 u[IU]/mL — ABNORMAL HIGH (ref 2.0–19.6)

## 2015-03-29 LAB — MAGNESIUM: MAGNESIUM: 2 mg/dL (ref 1.5–2.5)

## 2015-03-29 LAB — TSH: TSH: 0.824 u[IU]/mL (ref 0.350–4.500)

## 2015-03-29 LAB — VITAMIN D 25 HYDROXY (VIT D DEFICIENCY, FRACTURES): Vit D, 25-Hydroxy: 61 ng/mL (ref 30–100)

## 2015-03-29 LAB — HEMOGLOBIN A1C
Hgb A1c MFr Bld: 6 % — ABNORMAL HIGH (ref <5.7)
Mean Plasma Glucose: 126 mg/dL — ABNORMAL HIGH (ref <117)

## 2015-04-01 ENCOUNTER — Ambulatory Visit
Admission: RE | Admit: 2015-04-01 | Discharge: 2015-04-01 | Disposition: A | Discharge status: Home / Self Care | Source: Ambulatory Visit | Attending: Internal Medicine | Admitting: Internal Medicine

## 2015-04-01 DIAGNOSIS — R921 Mammographic calcification found on diagnostic imaging of breast: Secondary | ICD-10-CM

## 2015-04-01 DIAGNOSIS — Z803 Family history of malignant neoplasm of breast: Secondary | ICD-10-CM

## 2015-04-30 ENCOUNTER — Other Ambulatory Visit: Payer: Self-pay | Admitting: Internal Medicine

## 2015-07-01 ENCOUNTER — Encounter: Payer: Self-pay | Admitting: Physician Assistant

## 2015-07-01 ENCOUNTER — Ambulatory Visit (INDEPENDENT_AMBULATORY_CARE_PROVIDER_SITE_OTHER): Admitting: Physician Assistant

## 2015-07-01 VITALS — BP 148/86 | HR 72 | Temp 98.0°F | Resp 16 | Ht 65.0 in | Wt 200.0 lb

## 2015-07-01 DIAGNOSIS — E669 Obesity, unspecified: Secondary | ICD-10-CM | POA: Diagnosis not present

## 2015-07-01 DIAGNOSIS — E559 Vitamin D deficiency, unspecified: Secondary | ICD-10-CM

## 2015-07-01 DIAGNOSIS — R7303 Prediabetes: Secondary | ICD-10-CM

## 2015-07-01 DIAGNOSIS — Z79899 Other long term (current) drug therapy: Secondary | ICD-10-CM | POA: Diagnosis not present

## 2015-07-01 DIAGNOSIS — R7309 Other abnormal glucose: Secondary | ICD-10-CM | POA: Diagnosis not present

## 2015-07-01 DIAGNOSIS — I10 Essential (primary) hypertension: Secondary | ICD-10-CM

## 2015-07-01 DIAGNOSIS — E785 Hyperlipidemia, unspecified: Secondary | ICD-10-CM | POA: Diagnosis not present

## 2015-07-01 MED ORDER — LORATADINE 10 MG PO TABS: ORAL_TABLET | ORAL | Status: DC

## 2015-07-01 MED ORDER — TERBINAFINE HCL 1 % EX CREA: 1.0000 "application " | TOPICAL_CREAM | Freq: Every day | CUTANEOUS | Status: DC

## 2015-07-01 NOTE — Progress Notes (Signed)
Assessment and Plan:  1) Hypertension  -Continue medication, get a monitor and start to check blood pressure at home, if continue to be above 140/90 call the office and make appointment. Continue DASH diet.  Reminder to go to the ER if any CP, SOB, nausea, dizziness, severe HA, changes vision/speech, left arm numbness and tingling, and jaw pain.  2)Cholesterol  -Continue diet and exercise. Check cholesterol.   3) Pre-diabetes -Continue diet and exercise. Check A1C  4) Vitamin D Def - check level and continue medications.   5) Obesity with co morbidities - long discussion about weight loss, diet, and exercise  6. Plantar warts left foot Will schedule removal  Continue diet and meds as discussed. Further disposition pending results of labs.  HPI 59 y.o. female  presents for 3 month follow up with hypertension, hyperlipidemia, prediabetes and vitamin D. Her blood pressure has not been checked at home, today their BP is BP: (!) 148/86 mmHg  BP Readings from Last 5 Encounters:  07/01/15 148/86  03/28/15 142/82  10/04/14 122/80  07/05/14 122/84  03/08/14 122/80   She does not workout.  She denies chest pain, shortness of breath, dizziness.  She is on cholesterol medication.  zocor 40mg   and denies myalgias. Her cholesterol is at goal. The cholesterol last visit was:   Lab Results  Component Value Date   CHOL 133 03/28/2015   HDL 37* 03/28/2015   LDLCALC 75 03/28/2015   TRIG 106 03/28/2015   CHOLHDL 3.6 03/28/2015   She has been working on diet and exercise for prediabetes, and denies paresthesia of the feet, polydipsia, polyuria and visual disturbances. Last A1C in the office was:  Lab Results  Component Value Date   HGBA1C 6.0* 03/28/2015   Patient is on Vitamin D supplement.   Lab Results  Component Value Date   VD25OH 61 03/28/2015     BMI is Body mass index is 33.28 kg/(m^2)., she is struggling with weight loss due to time limitations. Wt Readings from Last 3  Encounters:  07/01/15 200 lb (90.719 kg)  03/28/15 199 lb (90.266 kg)  10/04/14 204 lb (92.534 kg)    Current Medications:  Current Outpatient Prescriptions on File Prior to Visit  Medication Sig Dispense Refill  . Cholecalciferol (VITAMIN D PO) Take 5,000 Int'l Units by mouth.    Marland Kitchen FLUoxetine (PROZAC) 20 MG capsule TAKE 3 CAPSULES (60 MG) DAILY FOR MOOD 270 capsule 99  . loratadine (CLARITIN) 10 MG tablet TAKE 1 TABLET DAILY FOR ALLERGIES 90 tablet 2  . meloxicam (MOBIC) 7.5 MG tablet TAKE 1 TABLET DAILY 90 tablet 99  . montelukast (SINGULAIR) 10 MG tablet TAKE 1 TABLET DAILY 90 tablet 1  . simvastatin (ZOCOR) 40 MG tablet TAKE 1 TABLET AT BEDTIME (NEED OFFICE VISIT) 90 tablet 1   No current facility-administered medications on file prior to visit.   Medical History:  Past Medical History  Diagnosis Date  . Hyperlipidemia   . Hypertension   . Allergy   . Vitamin D deficiency   . Depression   . Prediabetes 11/09/2013  . Obesity (BMI 30.0-34.9)    Allergies: No Known Allergies   Review of Systems  Constitutional: Negative.   HENT: Negative.   Eyes: Negative.   Respiratory: Negative.   Cardiovascular: Negative.   Gastrointestinal: Negative.   Genitourinary: Negative.   Musculoskeletal: Negative.   Skin: Negative.        Painful plantar wart left foot  Neurological: Negative.   Endo/Heme/Allergies: Negative.  Psychiatric/Behavioral: Negative.     Family history- Review and unchanged Social history- Review and unchanged Physical Exam: BP 148/86 mmHg  Pulse 72  Temp(Src) 98 F (36.7 C) (Temporal)  Resp 16  Ht 5\' 5"  (1.651 m)  Wt 200 lb (90.719 kg)  BMI 33.28 kg/m2 Wt Readings from Last 3 Encounters:  07/01/15 200 lb (90.719 kg)  03/28/15 199 lb (90.266 kg)  10/04/14 204 lb (92.534 kg)   General Appearance: Well nourished, in no apparent distress. Eyes: PERRLA, EOMs, conjunctiva no swelling or erythema Sinuses: No Frontal/maxillary tenderness ENT/Mouth: Ext  aud canals clear, TMs without erythema, bulging. No erythema, swelling, or exudate on post pharynx.  Tonsils not swollen or erythematous. Hearing normal.  Neck: Supple, thyroid normal.  Respiratory: Respiratory effort normal, BS equal bilaterally without rales, rhonchi, wheezing or stridor.  Cardio: RRR with no MRGs. Brisk peripheral pulses without edema.  Abdomen: Soft, + BS.  Non tender, no guarding, rebound, hernias, masses. Lymphatics: Non tender without lymphadenopathy.  Musculoskeletal: Full ROM, 5/5 strength, normal gait.  Skin: Left foot with 3 lateral plantar warts and scaly skin. Warm, dry without rashes, lesions, ecchymosis.  Neuro: Cranial nerves intact. Normal muscle tone, no cerebellar symptoms. Sensation intact.  Psych: Awake and oriented X 3, normal affect, Insight and Judgment appropriate.    Vicie Mutters, PA-C 2:44 PM [facility]

## 2015-07-01 NOTE — Patient Instructions (Addendum)
get a monitor and start to check blood pressure at home, if continue to be above 140/90 call the office and make appointment.  Monitor your blood pressure at home, if it is above 140/90 consistently call the office so we can adjust your medications. Go to the ER if any CP, SOB, nausea, dizziness, severe HA, changes vision/speech  DASH Eating Plan DASH stands for "Dietary Approaches to Stop Hypertension." The DASH eating plan is a healthy eating plan that has been shown to reduce high blood pressure (hypertension). Additional health benefits may include reducing the risk of type 2 diabetes mellitus, heart disease, and stroke. The DASH eating plan may also help with weight loss. WHAT DO I NEED TO KNOW ABOUT THE DASH EATING PLAN? For the DASH eating plan, you will follow these general guidelines:  Choose foods with a percent daily value for sodium of less than 5% (as listed on the food label).  Use salt-free seasonings or herbs instead of table salt or sea salt.  Check with your health care provider or pharmacist before using salt substitutes.  Eat lower-sodium products, often labeled as "lower sodium" or "no salt added."  Eat fresh foods.  Eat more vegetables, fruits, and low-fat dairy products.  Choose whole grains. Look for the word "whole" as the first word in the ingredient list.  Choose fish and skinless chicken or [place] more often than red meat. Limit fish, poultry, and meat to 6 oz (170 g) each day.  Limit sweets, desserts, sugars, and sugary drinks.  Choose heart-healthy fats.  Limit cheese to 1 oz (28 g) per day.  Eat more home-cooked food and less restaurant, buffet, and fast food.  Limit fried foods.  Cook foods using methods other than frying.  Limit canned vegetables. If you do use them, rinse them well to decrease the sodium.  When eating at a restaurant, ask that your food be prepared with less salt, or no salt if possible. WHAT FOODS CAN I EAT? Seek help from  a dietitian for individual calorie needs. Grains Whole grain or whole wheat bread. Brown rice. Whole grain or whole wheat pasta. Quinoa, bulgur, and whole grain cereals. Low-sodium cereals. Corn or whole wheat flour tortillas. Whole grain cornbread. Whole grain crackers. Low-sodium crackers. Vegetables Fresh or frozen vegetables (raw, steamed, roasted, or grilled). Low-sodium or reduced-sodium tomato and vegetable juices. Low-sodium or reduced-sodium tomato sauce and paste. Low-sodium or reduced-sodium canned vegetables.  Fruits All fresh, canned (in natural juice), or frozen fruits. Meat and Other Protein Products Ground beef (85% or leaner), grass-fed beef, or beef trimmed of fat. Skinless chicken or [place]. Ground chicken or [place]. Pork trimmed of fat. All fish and seafood. Eggs. Dried beans, peas, or lentils. Unsalted nuts and seeds. Unsalted canned beans. Dairy Low-fat dairy products, such as skim or 1% milk, 2% or reduced-fat cheeses, low-fat ricotta or cottage cheese, or plain low-fat yogurt. Low-sodium or reduced-sodium cheeses. Fats and Oils Tub margarines without trans fats. Light or reduced-fat mayonnaise and salad dressings (reduced sodium). Avocado. Safflower, olive, or canola oils. Natural peanut or almond butter. Other Unsalted popcorn and pretzels. The items listed above may not be a complete list of recommended foods or beverages. Contact your dietitian for more options. WHAT FOODS ARE NOT RECOMMENDED? Grains White bread. White pasta. White rice. Refined cornbread. Bagels and croissants. Crackers that contain trans fat. Vegetables Creamed or fried vegetables. Vegetables in a cheese sauce. Regular canned vegetables. Regular canned tomato sauce and paste. Regular tomato and vegetable juices.  Fruits Dried fruits. Canned fruit in light or heavy syrup. Fruit juice. Meat and Other Protein Products Fatty cuts of meat. Ribs, chicken wings, bacon, sausage, bologna, salami,  chitterlings, fatback, hot dogs, bratwurst, and packaged luncheon meats. Salted nuts and seeds. Canned beans with salt. Dairy Whole or 2% milk, cream, half-and-half, and cream cheese. Whole-fat or sweetened yogurt. Full-fat cheeses or blue cheese. Nondairy creamers and whipped toppings. Processed cheese, cheese spreads, or cheese curds. Condiments Onion and garlic salt, seasoned salt, table salt, and sea salt. Canned and packaged gravies. Worcestershire sauce. Tartar sauce. Barbecue sauce. Teriyaki sauce. Soy sauce, including reduced sodium. Steak sauce. Fish sauce. Oyster sauce. Cocktail sauce. Horseradish. Ketchup and mustard. Meat flavorings and tenderizers. Bouillon cubes. Hot sauce. Tabasco sauce. Marinades. Taco seasonings. Relishes. Fats and Oils Butter, stick margarine, lard, shortening, ghee, and bacon fat. Coconut, palm kernel, or palm oils. Regular salad dressings. Other Pickles and olives. Salted popcorn and pretzels. The items listed above may not be a complete list of foods and beverages to avoid. Contact your dietitian for more information. WHERE CAN I FIND MORE INFORMATION? National Heart, Lung, and Blood Institute: travelstabloid.com Document Released: 11/12/2011 Document Revised: 04/09/2014 Document Reviewed: 09/27/2013 [facility] Patient Information 2015 [place], [place]. This information is not intended to replace advice given to you by your health care provider. Make sure you discuss any questions you have with your health care provider.  I think it is possible that you have sleep apnea. It can cause interrupted sleep, headaches, frequent awakenings, fatigue, dry mouth, fast/slow heart beats, memory issues, anxiety/depression, swelling, numbness tingling hands/feet, weight gain, shortness of breath, and the list goes on. Sleep apnea needs to be ruled out because if it is left untreated it does eventually lead to abnormal heart beats, lung failure or  heart failure as well as increasing the risk of heart attack and stroke. There are masks you can wear OR a mouth piece that I can give you information about. Often times though people feel MUCH better after getting treatment.   Sleep Apnea  Sleep apnea is a sleep disorder characterized by abnormal pauses in breathing while you sleep. When your breathing pauses, the level of oxygen in your blood decreases. This causes you to move out of deep sleep and into light sleep. As a result, your quality of sleep is poor, and the system that carries your blood throughout your body (cardiovascular system) experiences stress. If sleep apnea remains untreated, the following conditions can develop:  High blood pressure (hypertension).  Coronary artery disease.  Inability to achieve or maintain an erection (impotence).  Impairment of your thought process (cognitive dysfunction). There are three types of sleep apnea: 1. Obstructive sleep apnea--Pauses in breathing during sleep because of a blocked airway. 2. Central sleep apnea--Pauses in breathing during sleep because the area of the brain that controls your breathing does not send the correct signals to the muscles that control breathing. 3. Mixed sleep apnea--A combination of both obstructive and central sleep apnea.  RISK FACTORS The following risk factors can increase your risk of developing sleep apnea:  Being overweight.  Smoking.  Having narrow passages in your nose and throat.  Being of older age.  Being female.  Alcohol use.  Sedative and tranquilizer use.  Ethnicity. Among individuals younger than 35 years, African Americans are at increased risk of sleep apnea. SYMPTOMS   Difficulty staying asleep.  Daytime sleepiness and fatigue.  Loss of energy.  Irritability.  Loud, heavy snoring.  Morning headaches.  Trouble concentrating.  Forgetfulness.  Decreased interest in sex. DIAGNOSIS  In order to diagnose sleep apnea, your  caregiver will perform a physical examination. Your caregiver may suggest that you take a home sleep test. Your caregiver may also recommend that you spend the night in a sleep lab. In the sleep lab, several monitors record information about your heart, lungs, and brain while you sleep. Your leg and arm movements and blood oxygen level are also recorded. TREATMENT The following actions may help to resolve mild sleep apnea:  Sleeping on your side.   Using a decongestant if you have nasal congestion.   Avoiding the use of depressants, including alcohol, sedatives, and narcotics.   Losing weight and modifying your diet if you are overweight. There also are devices and treatments to help open your airway:  Oral appliances. These are custom-made mouthpieces that shift your lower jaw forward and slightly open your bite. This opens your airway.  Devices that create positive airway pressure. This positive pressure "splints" your airway open to help you breathe better during sleep. The following devices create positive airway pressure:  Continuous positive airway pressure (CPAP) device. The CPAP device creates a continuous level of air pressure with an air pump. The air is delivered to your airway through a mask while you sleep. This continuous pressure keeps your airway open.  Nasal expiratory positive airway pressure (EPAP) device. The EPAP device creates positive air pressure as you exhale. The device consists of single-use valves, which are inserted into each nostril and held in place by adhesive. The valves create very little resistance when you inhale but create much more resistance when you exhale. That increased resistance creates the positive airway pressure. This positive pressure while you exhale keeps your airway open, making it easier to breath when you inhale again.  Bilevel positive airway pressure (BPAP) device. The BPAP device is used mainly in patients with central sleep apnea. This  device is similar to the CPAP device because it also uses an air pump to deliver continuous air pressure through a mask. However, with the BPAP machine, the pressure is set at two different levels. The pressure when you exhale is lower than the pressure when you inhale.  Surgery. Typically, surgery is only done if you cannot comply with less invasive treatments or if the less invasive treatments do not improve your condition. Surgery involves removing excess tissue in your airway to create a wider passage way. Document Released: 11/13/2002 Document Revised: 03/20/2013 Document Reviewed: 03/31/2012 [facility] Patient Information 2015 [place], [place]. This information is not intended to replace advice given to you by your health care provider. Make sure you discuss any questions you have with your health care provider.     We want weight loss that will last so you should lose 1-2 pounds a week.  THAT IS IT! Please pick THREE things a month to change. Once it is a habit check off the item. Then pick another three items off the list to become habits.  If you are already doing a habit on the list GREAT!  Cross that item off! o Don't drink your calories. Ie, alcohol, soda, fruit juice, and sweet tea.  o Drink more water. Drink a glass when you feel hungry or before each meal.  o Eat breakfast - Complex carb and protein (likeDannon light and fit yogurt, oatmeal, fruit, eggs, [place] bacon). o Measure your cereal.  Eat no more than one cup a day. (ie [place]) o Eat an apple a  day. o Add a vegetable a day. o Try a new vegetable a month. o Use Pam! Stop using oil or butter to cook. o Don't finish your plate or use smaller plates. o Share your dessert. o Eat sugar free Jello for dessert or frozen grapes. o Don't eat 2-3 hours before bed. o Switch to whole wheat bread, pasta, and brown rice. o Make healthier choices when you eat out. No fries! o Pick baked chicken, NOT fried. o Don't forget to SLOW DOWN when  you eat. It is not going anywhere.  o Take the stairs. o Park far away in the parking lot o [employer] (or weights) for 10 minutes while watching TV. o Walk at work for 10 minutes during break. o Walk outside 1 time a week with your friend, kids, dog, or significant other. o Start a walking group at [place] the mall as much as you can tolerate.  o Keep a food diary. o Weigh yourself daily. o Walk for 15 minutes 3 days per week. o Cook at home more often and eat out less.  If life happens and you go back to old habits, it is okay.  Just start over. You can do it!   If you experience chest pain, get short of breath, or tired during the exercise, please stop immediately and inform your doctor.   Plantar Warts Plantar warts are growths on the bottom of your foot. Warts are caused by a germ.  HOME CARE  Soak your foot in warm water. Dry your foot when you are done. Remove the top layer of softened skin, then apply any medicine as told by your doctor.  Remove any bandages daily. File off extra wart tissue. Repeat this as told by your doctor until the wart goes away.  Only use medicine as told by your doctor.  Use a bandage with a hole in it (doughnut bandage) to relieve pain. Put the hole over the wart.  Wear shoes and socks and change them daily.  Keep your foot clean and dry.  Check your feet regularly.  Avoid contact with warts on other people.  Have your warts checked by your doctor. GET HELP RIGHT AWAY IF: The treated skin becomes red, puffy (swollen), or painful. MAKE SURE YOU:  Understand these instructions.  Will watch your condition.  Will get help right away if you are not doing well or get worse. Document Released: 12/26/2010 Document Revised: 04/09/2014 Document Reviewed: 12/26/2010 [facility] Patient Information 2015 [place], [place]. This information is not intended to replace advice given to you by your health care provider. Make sure you discuss any  questions you have with your health care provider.

## 2015-07-02 LAB — CBC WITH DIFFERENTIAL/PLATELET
BASOS ABS: 0.1 10*3/uL (ref 0.0–0.1)
BASOS PCT: 1 % (ref 0–1)
EOS PCT: 6 % — AB (ref 0–5)
Eosinophils Absolute: 0.5 10*3/uL (ref 0.0–0.7)
HCT: 41.8 % (ref 36.0–46.0)
Hemoglobin: 13.8 g/dL (ref 12.0–15.0)
LYMPHS ABS: 2.7 10*3/uL (ref 0.7–4.0)
LYMPHS PCT: 30 % (ref 12–46)
MCH: 29.2 pg (ref 26.0–34.0)
MCHC: 33 g/dL (ref 30.0–36.0)
MCV: 88.6 fL (ref 78.0–100.0)
MONO ABS: 0.3 10*3/uL (ref 0.1–1.0)
MPV: 8.9 fL (ref 8.6–12.4)
Monocytes Relative: 3 % (ref 3–12)
Neutro Abs: 5.5 10*3/uL (ref 1.7–7.7)
Neutrophils Relative %: 60 % (ref 43–77)
Platelets: 288 10*3/uL (ref 150–400)
RBC: 4.72 MIL/uL (ref 3.87–5.11)
RDW: 13.4 % (ref 11.5–15.5)
WBC: 9.1 10*3/uL (ref 4.0–10.5)

## 2015-07-02 LAB — LIPID PANEL
CHOL/HDL RATIO: 3.5 ratio (ref <=5.0)
Cholesterol: 126 mg/dL (ref 125–200)
HDL: 36 mg/dL — AB (ref >=46)
LDL CALC: 61 mg/dL (ref <130)
TRIGLYCERIDES: 146 mg/dL (ref <150)
VLDL: 29 mg/dL (ref <30)

## 2015-07-02 LAB — BASIC METABOLIC PANEL WITH GFR
BUN: 15 mg/dL (ref 7–25)
CO2: 24 meq/L (ref 20–31)
Calcium: 9.2 mg/dL (ref 8.6–10.4)
Chloride: 106 mEq/L (ref 98–110)
Creat: 0.68 mg/dL (ref 0.50–1.05)
GFR, Est African American: >89 mL/min (ref >=60)
GFR, Est Non African American: >89 mL/min (ref >=60)
GLUCOSE: 90 mg/dL (ref 65–99)
Potassium: 4.4 mEq/L (ref 3.5–5.3)
Sodium: 142 mEq/L (ref 135–146)

## 2015-07-02 LAB — HEPATIC FUNCTION PANEL
ALT: 12 U/L (ref 6–29)
AST: 14 U/L (ref 10–35)
Albumin: 4.4 g/dL (ref 3.6–5.1)
Alkaline Phosphatase: 90 U/L (ref 33–130)
BILIRUBIN DIRECT: 0.1 mg/dL (ref <=0.2)
Indirect Bilirubin: 0.1 mg/dL — ABNORMAL LOW (ref 0.2–1.2)
Total Bilirubin: 0.2 mg/dL (ref 0.2–1.2)
Total Protein: 7.2 g/dL (ref 6.1–8.1)

## 2015-07-02 LAB — TSH: TSH: 1.241 u[IU]/mL (ref 0.350–4.500)

## 2015-07-02 LAB — INSULIN, FASTING: Insulin fasting, serum: 28.9 u[IU]/mL — ABNORMAL HIGH (ref 2.0–19.6)

## 2015-07-02 LAB — MAGNESIUM: Magnesium: 2 mg/dL (ref 1.5–2.5)

## 2015-07-02 LAB — HEMOGLOBIN A1C
Hgb A1c MFr Bld: 5.9 % — ABNORMAL HIGH (ref <5.7)
MEAN PLASMA GLUCOSE: 123 mg/dL — AB (ref <117)

## 2015-07-03 LAB — VITAMIN D 25 HYDROXY (VIT D DEFICIENCY, FRACTURES): Vit D, 25-Hydroxy: 54 ng/mL (ref 30–100)

## 2015-07-24 ENCOUNTER — Encounter: Payer: Self-pay | Admitting: Physician Assistant

## 2015-07-24 ENCOUNTER — Ambulatory Visit (INDEPENDENT_AMBULATORY_CARE_PROVIDER_SITE_OTHER): Admitting: Physician Assistant

## 2015-07-24 VITALS — BP 142/70 | HR 80 | Temp 98.5°F | Resp 16 | Ht 65.0 in | Wt 204.6 lb

## 2015-07-24 DIAGNOSIS — B353 Tinea pedis: Secondary | ICD-10-CM | POA: Diagnosis not present

## 2015-07-24 DIAGNOSIS — I10 Essential (primary) hypertension: Secondary | ICD-10-CM | POA: Diagnosis not present

## 2015-07-24 DIAGNOSIS — B070 Plantar wart: Secondary | ICD-10-CM

## 2015-07-24 MED ORDER — CLOTRIMAZOLE-BETAMETHASONE 1-0.05 % EX CREA: 1.0000 "application " | TOPICAL_CREAM | Freq: Two times a day (BID) | CUTANEOUS | Status: DC

## 2015-07-24 NOTE — Progress Notes (Signed)
Subjective:    Sonya Peterson is a 59 y.o. female who complains of plantar warts. The warts are located on left foot. They have been present for 4 months. The patient does have pain with the warts, but denies infection symptoms. She has also been monitoring her BP at home, it has been running 140's  Blood pressure 142/70, pulse 80, temperature 98.5 F (36.9 C), resp. rate 16, height 5\' 5"  (1.651 m), weight 204 lb 9.6 oz (92.806 kg).  Past Medical History  Diagnosis Date  . Hyperlipidemia   . Hypertension   . Allergy   . Vitamin D deficiency   . Depression   . Prediabetes 11/09/2013  . Obesity (BMI 30.0-34.9)    Current Outpatient Prescriptions on File Prior to Visit  Medication Sig Dispense Refill  . Cholecalciferol (VITAMIN D PO) Take 5,000 Int'l Units by mouth.    Marland Kitchen FLUoxetine (PROZAC) 20 MG capsule TAKE 3 CAPSULES (60 MG) DAILY FOR MOOD 270 capsule 99  . loratadine (CLARITIN) 10 MG tablet TAKE 1 TABLET DAILY FOR ALLERGIES 90 tablet 2  . meloxicam (MOBIC) 7.5 MG tablet TAKE 1 TABLET DAILY 90 tablet 99  . montelukast (SINGULAIR) 10 MG tablet TAKE 1 TABLET DAILY 90 tablet 1  . simvastatin (ZOCOR) 40 MG tablet TAKE 1 TABLET AT BEDTIME (NEED OFFICE VISIT) 90 tablet 1  . terbinafine (LAMISIL) 1 % cream Apply 1 application topically daily. For 4 weeks 42 g 1   No current facility-administered medications on file prior to visit.    Review of Systems Pertinent items are noted in HPI.    Objective:    Skin: Left foot with 3 lateral plantar warts and scaly skin  Assessment:    Plantar warts   Hypertension Tinea pedis   Plan:    1. The viral etiology and natural history has been discussed.  2. Various treatment methods, side effects and failure rates have been discussed.   3. Area was cleaned with alcohol, lidocaine without epi was used to numb the areas and they were debrided down to the core and electrocautery was used 4. Tinea pedis- cream sent in 5. Hypertension- continue  to monitor at home, weight loss advised.

## 2015-07-24 NOTE — Patient Instructions (Signed)
Plantar Warts Warts are benign (noncancerous) growths of the outer skin layer. They can occur at any time in life but are most common during childhood and the teen years. Warts can occur on many skin surfaces of the body. When they occur on the underside (sole) of your foot they are called plantar warts. They often emerge in groups with several small warts encircling a larger growth. CAUSES  Human papillomavirus (HPV) is the cause of plantar warts. HPV attacks a break in the skin of the foot. Walking barefoot can lead to exposure to the wart virus. Plantar warts tend to develop over areas of pressure such as the heel and ball of the foot. Plantar warts often grow into the deeper layers of skin. They may spread to other areas of the sole but cannot spread to other areas of the body. SYMPTOMS  You may also notice a growth on the undersurface of your foot. The wart may grow directly into the sole of the foot, or rise above the surface of the skin on the sole of the foot, or both. They are most often flat from pressure. Warts generally do not cause itching but may cause pain in the area of the wart when you put weight on your foot. DIAGNOSIS  Diagnosis is made by physical examination. This means your caregiver discovers it while examining your foot.  TREATMENT  There are many ways to treat plantar warts. However, warts are very tough. Sometimes it is difficult to treat them so that they go away completely and do not grow back. Any treatment must be done regularly to work. If left untreated, most plantar warts will eventually disappear over a period of one to two years. Treatments you can do at home include:  Putting duct tape over the top of the wart (occlusion) has been found to be effective over several months. The duct tape should be removed each night and reapplied until the wart has disappeared.  Placing over-the-counter medications on top of the wart to help kill the wart virus and remove the wart  tissue (salicylic acid, cantharidin, and dichloroacetic acid) are useful. These are called keratolytic agents. These medications make the skin soft and gradually layers will shed away. These compounds are usually placed on the wart each night and then covered with a bandage. They are also available in premedicated bandage form. Avoid surrounding skin when applying these liquids as these medications can burn healthy skin. The treatment may take several months of nightly use to be effective.  Cryotherapy to freeze the wart has recently become available over-the-counter for children 4 years and older. This system makes use of a soft narrow applicator connected to a bottle of compressed cold liquid that is applied directly to the wart. This medication can burn healthy skin and should be used with caution.  As with all over-the-counter medications, read the directions carefully before use. Treatments generally done in your caregiver's office include:  Some aggressive treatments may cause discomfort, discoloration, and scarring of the surrounding skin. The risks and benefits of treatment should be discussed with your caregiver.  Freezing the wart with liquid nitrogen (cryotherapy, see above).  Burning the wart with use of very high heat (cautery).  Injecting medication into the wart.  Surgically removing or laser treatment of the wart.  Your caregiver may refer you to a dermatologist for difficult to treat large-sized warts or large numbers of warts. HOME CARE INSTRUCTIONS   Soak the affected area in warm water. Dry the   area completely when you are done. Remove the top layer of softened skin, then apply the chosen topical medication and reapply a bandage.  Remove the bandage daily and file excess wart tissue (pumice stone works well for this purpose). Repeat the entire process daily or every other day for weeks until the plantar wart disappears.  Several brands of salicylic acid pads are available  as over-the-counter remedies.  Pain can be relieved by wearing a donut bandage. This is a bandage with a hole in it. The bandage is put on with the hole over the wart. This helps take the pressure off the wart and gives pain relief. To help prevent plantar warts:  Wear shoes and socks and change them daily.  Keep feet clean and dry.  Check your feet and your children's feet regularly.  Avoid direct contact with warts on other people.  Have growths or changes on your skin checked by your caregiver. Document Released: 02/13/2004 Document Revised: 04/09/2014 Document Reviewed: 07/24/2009 Exit[employer] Patient Information 2015 [employer]. This information is not intended to replace advice given to you by your health care provider. Make sure you discuss any questions you have with your health care provider.  

## 2015-07-31 ENCOUNTER — Ambulatory Visit: Payer: Self-pay | Admitting: Physician Assistant

## 2015-08-28 ENCOUNTER — Encounter: Payer: Self-pay | Admitting: Physician Assistant

## 2015-09-08 ENCOUNTER — Other Ambulatory Visit: Payer: Self-pay | Admitting: Internal Medicine

## 2015-10-09 ENCOUNTER — Ambulatory Visit (INDEPENDENT_AMBULATORY_CARE_PROVIDER_SITE_OTHER)

## 2015-10-09 DIAGNOSIS — Z23 Encounter for immunization: Secondary | ICD-10-CM | POA: Diagnosis not present

## 2015-10-25 ENCOUNTER — Other Ambulatory Visit: Payer: Self-pay | Admitting: Internal Medicine

## 2015-12-07 ENCOUNTER — Other Ambulatory Visit: Payer: Self-pay | Admitting: Physician Assistant

## 2015-12-14 ENCOUNTER — Other Ambulatory Visit: Payer: Self-pay | Admitting: Internal Medicine

## 2015-12-19 ENCOUNTER — Other Ambulatory Visit: Payer: Self-pay | Admitting: Internal Medicine

## 2016-03-04 ENCOUNTER — Other Ambulatory Visit: Payer: Self-pay | Admitting: Internal Medicine

## 2016-03-08 ENCOUNTER — Other Ambulatory Visit: Payer: Self-pay | Admitting: Physician Assistant

## 2016-03-11 ENCOUNTER — Other Ambulatory Visit: Payer: Self-pay | Admitting: Internal Medicine

## 2016-03-30 ENCOUNTER — Ambulatory Visit (INDEPENDENT_AMBULATORY_CARE_PROVIDER_SITE_OTHER): Admitting: Podiatry

## 2016-03-30 ENCOUNTER — Encounter: Payer: Self-pay | Admitting: Podiatry

## 2016-03-30 ENCOUNTER — Encounter: Payer: Self-pay | Admitting: Physician Assistant

## 2016-03-30 VITALS — BP 145/90 | HR 75 | Resp 16

## 2016-03-30 DIAGNOSIS — M779 Enthesopathy, unspecified: Secondary | ICD-10-CM | POA: Diagnosis not present

## 2016-03-30 DIAGNOSIS — L84 Corns and callosities: Secondary | ICD-10-CM | POA: Diagnosis not present

## 2016-03-30 DIAGNOSIS — M79675 Pain in left toe(s): Secondary | ICD-10-CM

## 2016-03-30 MED ORDER — TRIAMCINOLONE ACETONIDE 10 MG/ML IJ SUSP
10.0000 mg | Freq: Once | INTRAMUSCULAR | Status: AC
Start: 2016-03-30 — End: 2016-03-30
  Administered 2016-03-30: 10 mg

## 2016-03-30 NOTE — Progress Notes (Signed)
Subjective:     Patient ID: Sonya Peterson, female   DOB: 26-Sep-1956, 60 y.o.   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 I then did deep debridement of lesion which was tolerated well and patient be seen back for routine care as needed

## 2016-06-11 ENCOUNTER — Other Ambulatory Visit: Payer: Self-pay | Admitting: Internal Medicine

## 2016-06-11 DIAGNOSIS — R921 Mammographic calcification found on diagnostic imaging of breast: Secondary | ICD-10-CM

## 2016-06-16 ENCOUNTER — Other Ambulatory Visit: Payer: Self-pay | Admitting: Internal Medicine

## 2016-06-18 ENCOUNTER — Ambulatory Visit
Admission: RE | Admit: 2016-06-18 | Discharge: 2016-06-18 | Disposition: A | Discharge status: Home / Self Care | Source: Ambulatory Visit | Attending: Internal Medicine | Admitting: Internal Medicine

## 2016-06-18 DIAGNOSIS — R921 Mammographic calcification found on diagnostic imaging of breast: Secondary | ICD-10-CM

## 2016-07-21 ENCOUNTER — Other Ambulatory Visit: Payer: Self-pay | Admitting: Internal Medicine

## 2016-09-21 ENCOUNTER — Encounter: Payer: Self-pay | Admitting: Physician Assistant

## 2016-09-21 ENCOUNTER — Ambulatory Visit (INDEPENDENT_AMBULATORY_CARE_PROVIDER_SITE_OTHER): Admitting: Physician Assistant

## 2016-09-21 VITALS — BP 134/88 | HR 81 | Temp 98.6°F | Resp 16 | Ht 65.0 in | Wt 206.0 lb

## 2016-09-21 DIAGNOSIS — Z79899 Other long term (current) drug therapy: Secondary | ICD-10-CM | POA: Diagnosis not present

## 2016-09-21 DIAGNOSIS — E559 Vitamin D deficiency, unspecified: Secondary | ICD-10-CM

## 2016-09-21 DIAGNOSIS — R7303 Prediabetes: Secondary | ICD-10-CM

## 2016-09-21 DIAGNOSIS — I10 Essential (primary) hypertension: Secondary | ICD-10-CM

## 2016-09-21 DIAGNOSIS — Z0001 Encounter for general adult medical examination with abnormal findings: Secondary | ICD-10-CM | POA: Diagnosis not present

## 2016-09-21 DIAGNOSIS — E6609 Other obesity due to excess calories: Secondary | ICD-10-CM

## 2016-09-21 DIAGNOSIS — R6889 Other general symptoms and signs: Secondary | ICD-10-CM | POA: Diagnosis not present

## 2016-09-21 DIAGNOSIS — Z136 Encounter for screening for cardiovascular disorders: Secondary | ICD-10-CM | POA: Diagnosis not present

## 2016-09-21 DIAGNOSIS — Z23 Encounter for immunization: Secondary | ICD-10-CM

## 2016-09-21 DIAGNOSIS — F3341 Major depressive disorder, recurrent, in partial remission: Secondary | ICD-10-CM

## 2016-09-21 DIAGNOSIS — Z6833 Body mass index (BMI) 33.0-33.9, adult: Secondary | ICD-10-CM

## 2016-09-21 DIAGNOSIS — T7840XA Allergy, unspecified, initial encounter: Secondary | ICD-10-CM

## 2016-09-21 DIAGNOSIS — E785 Hyperlipidemia, unspecified: Secondary | ICD-10-CM

## 2016-09-21 LAB — CBC WITH DIFFERENTIAL/PLATELET
BASOS ABS: 0 {cells}/uL (ref 0–200)
BASOS PCT: 0 %
EOS PCT: 5 %
Eosinophils Absolute: 465 cells/uL (ref 15–500)
HCT: 42.3 % (ref 35.0–45.0)
HEMOGLOBIN: 14 g/dL (ref 11.7–15.5)
LYMPHS ABS: 2418 {cells}/uL (ref 850–3900)
Lymphocytes Relative: 26 %
MCH: 29.4 pg (ref 27.0–33.0)
MCHC: 33.1 g/dL (ref 32.0–36.0)
MCV: 88.7 fL (ref 80.0–100.0)
MPV: 8.8 fL (ref 7.5–12.5)
Monocytes Absolute: 930 cells/uL (ref 200–950)
Monocytes Relative: 10 %
NEUTROS ABS: 5487 {cells}/uL (ref 1500–7800)
Neutrophils Relative %: 59 %
PLATELETS: 298 10*3/uL (ref 140–400)
RBC: 4.77 MIL/uL (ref 3.80–5.10)
RDW: 13.7 % (ref 11.0–15.0)
WBC: 9.3 10*3/uL (ref 3.8–10.8)

## 2016-09-21 LAB — HEMOGLOBIN A1C
HEMOGLOBIN A1C: 5.6 % (ref <5.7)
MEAN PLASMA GLUCOSE: 114 mg/dL

## 2016-09-21 MED ORDER — SIMVASTATIN 40 MG PO TABS: ORAL_TABLET | ORAL | 1 refills | Status: DC

## 2016-09-21 MED ORDER — MONTELUKAST SODIUM 10 MG PO TABS: 10.0000 mg | ORAL_TABLET | Freq: Every day | ORAL | 2 refills | Status: DC

## 2016-09-21 MED ORDER — FLUOXETINE HCL 20 MG PO CAPS: ORAL_CAPSULE | ORAL | 1 refills | Status: DC

## 2016-09-21 MED ORDER — LORATADINE 10 MG PO TABS: ORAL_TABLET | ORAL | 1 refills | Status: DC

## 2016-09-21 MED ORDER — MELOXICAM 7.5 MG PO TABS: 7.5000 mg | ORAL_TABLET | Freq: Every day | ORAL | 1 refills | Status: DC

## 2016-09-21 NOTE — Patient Instructions (Addendum)
Dr. Earlean Shawl, can call about colonoscopy, due 5 or 10 years, Phone: [phone];  We want weight loss that will last so you should lose 1-2 pounds a week.  THAT IS IT! Please pick THREE things a month to change. Once it is a habit check off the item. Then pick another three items off the list to become habits.  If you are already doing a habit on the list GREAT!  Cross that item off! o Don't drink your calories. Ie, alcohol, soda, fruit juice, and sweet tea.  o Drink more water. Drink a glass when you feel hungry or before each meal.  o Eat breakfast - Complex carb and protein (likeDannon light and fit yogurt, oatmeal, fruit, eggs, [place] bacon). o Measure your cereal.  Eat no more than one cup a day. (ie [place]) o Eat an apple a day. o Add a vegetable a day. o Try a new vegetable a month. o Use Pam! Stop using oil or butter to cook. o Don't finish your plate or use smaller plates. o Share your dessert. o Eat sugar free Jello for dessert or frozen grapes. o Don't eat 2-3 hours before bed. o Switch to whole wheat bread, pasta, and brown rice. o Make healthier choices when you eat out. No fries! o Pick baked chicken, NOT fried. o Don't forget to SLOW DOWN when you eat. It is not going anywhere.  o Take the stairs. o Park far away in the parking lot o [employer] (or weights) for 10 minutes while watching TV. o Walk at work for 10 minutes during break. o Walk outside 1 time a week with your friend, kids, dog, or significant other. o Start a walking group at [place] the mall as much as you can tolerate.  o Keep a food diary. o Weigh yourself daily. o Walk for 15 minutes 3 days per week. o Cook at home more often and eat out less.  If life happens and you go back to old habits, it is okay.  Just start over. You can do it!   If you experience chest pain, get short of breath, or tired during the exercise, please stop immediately and inform your doctor.   Ways to cut 100 calories   1. Eat your eggs with hot sauce OR salsa instead of cheese.  Eggs are great for breakfast, but many people consider eggs and cheese to be BFFs. Instead of cheese-1 oz. of cheddar has 114 calories-top your eggs with hot sauce, which contains no calories and helps with satiety and metabolism. Salsa is also a great option!!  2. Top your toast, waffles or pancakes with mashed berries instead of jelly or syrup. Half a cup of berries-fresh, frozen or thawed-has about 40 calories, compared with 2 tbsp. of maple syrup or jelly, which both have about 100 calories. The berries will also give you a good punch of fiber, which helps keep you full and satisfied and won't spike blood sugar quickly like the jelly or syrup. 3. Swap the non-fat latte for black coffee with a splash of half-and-half. Contrary to its name, that non-fat latte has 130 calories and a startling 19g of carbohydrates per 16 oz. serving. Replacing that 'light' drinkable dessert with a black coffee with a splash of half-and-half saves you more than 100 calories per 16 oz. serving. 4. Sprinkle salads with freeze-dried raspberries instead of dried cranberries. If you want a sweet addition to your nutritious salad, stay away from dried cranberries.  They have a whopping 130 calories per  cup and 30g carbohydrates. Instead, sprinkle freeze-dried raspberries guilt-free and save more than 100 calories per  cup serving, adding 3g of belly-filling fiber. 5. Go for mustard in place of mayo on your sandwich. Mustard can add really nice flavor to any sandwich, and there are tons of varieties, from spicy to honey. A serving of mayo is 95 calories, versus 10 calories in a serving of mustard. 6. Choose a DIY salad dressing instead of the store-bought kind. Mix Dijon or whole grain mustard with low-fat Kefir or red wine vinegar and garlic. 7. Use hummus as a spread instead of a dip. Use hummus as a spread on a high-fiber cracker or tortilla with a sandwich and  save on calories without sacrificing taste. 8. Pick just one salad "accessory." Salad isn't automatically a calorie winner. It's easy to over-accessorize with toppings. Instead of topping your salad with nuts, avocado and cranberries (all three will clock in at 313 calories), just pick one. The next day, choose a different accessory, which will also keep your salad interesting. You don't wear all your jewelry every day, right? 9. Ditch the white pasta in favor of spaghetti squash. One cup of cooked spaghetti squash has about 40 calories, compared with traditional spaghetti, which comes with more than 200. Spaghetti squash is also nutrient-dense. It's a good source of fiber and Vitamins A and C, and it can be eaten just like you would eat pasta-with a great tomato sauce and [place] meatballs or with pesto, tofu and spinach, for example. 10. Dress up your chili, soups and stews with non-fat [place] yogurt instead of sour cream. Just a 'dollop' of sour cream can set you back 115 calories and a whopping 12g of fat-seven of which are of the artery-clogging variety. Added bonus: [place] yogurt is packed with muscle-building protein, calcium and B Vitamins. 11. Mash cauliflower instead of mashed potatoes. One cup of traditional mashed potatoes-in all their creamy goodness-has more than 200 calories, compared to mashed cauliflower, which you can typically eat for less than 100 calories per 1 cup serving. Cauliflower is a great source of the antioxidant indole-3-carbinol (I3C), which may help reduce the risk of some cancers, like breast cancer. 12. Ditch the ice cream sundae in favor of a [place] yogurt parfait. Instead of a cup of ice cream or fro-yo for dessert, try 1 cup of nonfat Greek yogurt topped with fresh berries and a sprinkle of cacao nibs. Both toppings are packed with antioxidants, which can help reduce cellular inflammation and oxidative damage. And the comparison is a no-brainer: One cup of ice cream has  about 275 calories; one cup of frozen yogurt has about 230; and a cup of Greek yogurt has just 130, plus twice the protein, so you're less likely to return to the freezer for a second helping. 13. Put olive oil in a spray container instead of using it directly from the bottle. Each tablespoon of olive oil is 120 calories and 15g of fat. Use a mister instead of pouring it straight into the pan or onto a salad. This allows for portion control and will save you more than 100 calories. 14. When baking, substitute canned pumpkin for butter or oil. Canned pumpkin-not pumpkin pie mix-is loaded with Vitamin A, which is important for skin and eye health, as well as immunity. And the comparisons are pretty crazy:  cup of canned pumpkin has about 40 calories, compared to butter or oil, which has more  than 800 calories. Yes, 800 calories. Applesauce and mashed banana can also serve as good substitutions for butter or oil, usually in a 1:1 ratio. 15. Top casseroles with high-fiber cereal instead of breadcrumbs. Breadcrumbs are typically made with white bread, while breakfast cereals contain 5-9g of fiber per serving. Not only will you save more than 150 calories per  cup serving, the swap will also keep you more full and you'll get a metabolism boost from the added fiber. 16. Snack on pistachios instead of macadamia nuts. Believe it or not, you get the same amount of calories from 35 pistachios (100 calories) as you would from only five macadamia nuts. 17. Chow down on kale chips rather than potato chips. This is my favorite 'don't knock it 'till you try it' swap. Kale chips are so easy to make at home, and you can spice them up with a little grated parmesan or chili powder. Plus, they're a mere fraction of the calories of potato chips, but with the same crunch factor we crave so often. 18. Add seltzer and some fruit slices to your cocktail instead of soda or fruit juice. One cup of soda or fruit juice can pack on  as much as 140 calories. Instead, use seltzer and fruit slices. The fruit provides valuable phytochemicals, such as flavonoids and anthocyanins, which help to combat cancer and stave off the aging process.

## 2016-09-21 NOTE — Progress Notes (Signed)
Complete Physical  Assessment and Plan:  Essential hypertension - continue medications, DASH diet, exercise and monitor at home. Call if greater than 130/80.  - CBC with Differential/Platelet - BASIC METABOLIC PANEL WITH GFR - Hepatic function panel - TSH - Urinalysis, Routine w reflex microscopic - Microalbumin / creatinine urine ratio - EKG 12-Lead  Prediabetes Discussed general issues about diabetes pathophysiology and management., Educational material distributed., Suggested low cholesterol diet., Encouraged aerobic exercise., Discussed foot care., Reminded to get yearly retinal exam - Hemoglobin A1c - Insulin, fasting   Hyperlipidemia -continue medications, check lipids, decrease fatty foods, increase activity.  - Lipid panel  Vitamin D deficiency - Vit D  25 hydroxy (rtn osteoporosis monitoring)   Obesity Obesity with co morbidities- long discussion about weight loss, diet, and exercise  Depression Remission, continue prozac  Allergy, initial encounter Continue OTC allergy pills  Medication management - Magnesium  Encounter for general adult medical examination with abnormal findings  Discussed med's effects and SE's. Screening labs and tests as requested with regular follow-up as recommended. Over 40 minutes of exam, counseling, chart review, and critical decision making was performed this visit.   HPI  60 y.o. female  presents for a complete physical.  Her blood pressure has been controlled at home, today their BP is BP: 134/88 She does not workout. She denies chest pain, shortness of breath, dizziness.  She is on cholesterol medication, zocor 40mg  and denies myalgias. Her cholesterol is at goal. The cholesterol last visit was:   Lab Results  Component Value Date   CHOL 126 07/01/2015   HDL 36 (L) 07/01/2015   LDLCALC 61 07/01/2015   TRIG 146 07/01/2015   CHOLHDL 3.5 07/01/2015   She has been working on diet and exercise for prediabetes, and denies  paresthesia of the feet, polydipsia, polyuria and visual disturbances. Last A1C in the office was:  Lab Results  Component Value Date   HGBA1C 5.9 (H) 07/01/2015   Patient is on Vitamin D supplement.   Lab Results  Component Value Date   VD25OH 50 07/01/2015   She is on prozac 20mg , 3 tabs a day for depression which helps.  BMI is Body mass index is 34.28 kg/m., she is working on diet and exercise. Wt Readings from Last 3 Encounters:  09/21/16 206 lb (93.4 kg)  07/24/15 204 lb 9.6 oz (92.8 kg)  07/01/15 200 lb (90.7 kg)     Current Medications:  Current Outpatient Prescriptions on File Prior to Visit  Medication Sig Dispense Refill  . Cholecalciferol (VITAMIN D PO) Take 5,000 Int'l Units by mouth.    Marland Kitchen FLUoxetine (PROZAC) 20 MG capsule TAKE 3 CAPSULES (60 MG) DAILY FOR MOOD 270 capsule 1  . loratadine (CLARITIN) 10 MG tablet TAKE 1 TABLET DAILY FOR ALLERGIES 90 tablet 1  . meloxicam (MOBIC) 7.5 MG tablet TAKE 1 TABLET DAILY 30 tablet 0  . montelukast (SINGULAIR) 10 MG tablet TAKE 1 TABLET DAILY 90 tablet 2  . simvastatin (ZOCOR) 40 MG tablet TAKE 1 TABLET AT BEDTIME (NEED OFFICE VISIT) 30 tablet 0   No current facility-administered medications on file prior to visit.    Health Maintenance:   Immunization History  Administered Date(s) Administered  . Influenza Split 08/31/2013, 10/04/2014, 10/09/2015  . Influenza, Seasonal, Injecte, Preservative Fre 09/21/2016  . Tdap 03/28/2015   Tetanus: 2016 Pneumovax: 1999 Flu vaccine: 2017 Zostavax: N/A  Pap: 03/02/2013 neg will get every 5 years, due age 75 and 60, never abnormal pap Diagnostic MGM: 06/2016  DEXA: N/A Colonoscopy: 2011, Dr. Earlean Shawl due 2016 EGD:N/A LMP 2008 DEE- unknown  Patient Care Team: Unk Pinto, MD as PCP - General (Internal Medicine) Newt Minion, MD as Consulting Physician (Orthopedic Surgery) Richmond Campbell, MD as Consulting Physician (Gastroenterology)  Medical History:  Past Medical  History:  Diagnosis Date  . Allergy   . Depression   . Hyperlipidemia   . Hypertension   . Obesity (BMI 30.0-34.9)   . Prediabetes 11/09/2013  . Vitamin D deficiency    Allergies No Known Allergies  SURGICAL HISTORY She  has a past surgical history that includes Lumbar disc surgery (2004); Tubal ligation; Dilation and curettage of uterus; and Heel spur surgery (Left, 2015). FAMILY HISTORY Her family history includes Cancer (age of onset: 10) in her mother; Diabetes in her mother; Hypertension in her mother. SOCIAL HISTORY She  reports that she has never smoked. She has never used smokeless tobacco. She reports that she does not drink alcohol or use drugs.   Review of Systems: Review of Systems  Constitutional: Negative.   HENT: Negative.   Eyes: Negative.   Respiratory: Negative.   Cardiovascular: Negative.   Gastrointestinal: Negative.   Genitourinary: Negative.   Musculoskeletal: Negative.   Skin: Negative.   Neurological: Negative.   Endo/Heme/Allergies: Negative.   Psychiatric/Behavioral: Negative.     Physical Exam: Estimated body mass index is 34.28 kg/m as calculated from the following:   Height as of this encounter: 5\' 5"  (1.651 m).   Weight as of this encounter: 206 lb (93.4 kg). BP 134/88   Pulse 81   Temp 98.6 F (37 C)   Resp 16   Ht 5\' 5"  (1.651 m)   Wt 206 lb (93.4 kg)   SpO2 98%   BMI 34.28 kg/m  General Appearance: Well nourished, in no apparent distress.  Eyes: PERRLA, EOMs, conjunctiva no swelling or erythema, normal fundi and vessels.  Sinuses: No Frontal/maxillary tenderness  ENT/Mouth: Ext aud canals clear, normal light reflex with TMs without erythema, bulging. Good dentition. No erythema, swelling, or exudate on post pharynx. Tonsils not swollen or erythematous. Hearing normal.  Neck: Supple, thyroid normal. No bruits  Respiratory: Respiratory effort normal, BS equal bilaterally without rales, rhonchi, wheezing or stridor.  Cardio: RRR  without murmurs, rubs or gallops. Brisk peripheral pulses without edema.  Chest: symmetric, with normal excursions and percussion.  Breasts: Symmetric, no masses, no nipple discharge, no retractions.  Abdomen: Soft, obese, nontender, no guarding, rebound,  masses, or organomegaly.+ ventral hernia and small umbilical hernia Lymphatics: Non tender without lymphadenopathy.  Genitourinary: defer Musculoskeletal: Full ROM all peripheral extremities,5/5 strength, and normal gait.  Skin: Warm, dry without rashes, lesions, ecchymosis. Neuro: Cranial nerves intact, reflexes equal bilaterally. Normal muscle tone, no cerebellar symptoms. Sensation intact.  Psych: Awake and oriented X 3, normal affect, Insight and Judgment appropriate.   EKG: WNL no changes.  Vicie Mutters 3:19 PM [facility]

## 2016-09-22 LAB — BASIC METABOLIC PANEL WITH GFR
BUN: 16 mg/dL (ref 7–25)
CHLORIDE: 104 mmol/L (ref 98–110)
CO2: 26 mmol/L (ref 20–31)
Calcium: 9.4 mg/dL (ref 8.6–10.4)
Creat: 0.7 mg/dL (ref 0.50–1.05)
GFR, Est African American: >89 mL/min (ref >=60)
Glucose, Bld: 76 mg/dL (ref 65–99)
POTASSIUM: 4.1 mmol/L (ref 3.5–5.3)
Sodium: 140 mmol/L (ref 135–146)

## 2016-09-22 LAB — LIPID PANEL
Cholesterol: 136 mg/dL (ref 125–200)
HDL: 35 mg/dL — AB (ref >=46)
LDL Cholesterol: 75 mg/dL (ref <130)
Total CHOL/HDL Ratio: 3.9 Ratio (ref <=5.0)
Triglycerides: 128 mg/dL (ref <150)
VLDL: 26 mg/dL (ref <30)

## 2016-09-22 LAB — URINALYSIS, MICROSCOPIC ONLY
Bacteria, UA: NONE SEEN [HPF]
CRYSTALS: NONE SEEN [HPF]
Casts: NONE SEEN [LPF]
YEAST: NONE SEEN [HPF]

## 2016-09-22 LAB — MICROALBUMIN / CREATININE URINE RATIO
Creatinine, Urine: 101 mg/dL (ref 20–320)
MICROALB UR: 0.8 mg/dL
MICROALB/CREAT RATIO: 8 ug/mg{creat} (ref <30)

## 2016-09-22 LAB — HEPATIC FUNCTION PANEL
ALK PHOS: 96 U/L (ref 33–130)
ALT: 10 U/L (ref 6–29)
AST: 14 U/L (ref 10–35)
Albumin: 4.2 g/dL (ref 3.6–5.1)
BILIRUBIN DIRECT: 0 mg/dL (ref <=0.2)
BILIRUBIN INDIRECT: 0.2 mg/dL (ref 0.2–1.2)
BILIRUBIN TOTAL: 0.2 mg/dL (ref 0.2–1.2)
Total Protein: 7.4 g/dL (ref 6.1–8.1)

## 2016-09-22 LAB — URINALYSIS, ROUTINE W REFLEX MICROSCOPIC
Bilirubin Urine: NEGATIVE
GLUCOSE, UA: NEGATIVE
HGB URINE DIPSTICK: NEGATIVE
KETONES UR: NEGATIVE
NITRITE: NEGATIVE
Protein, ur: NEGATIVE
Specific Gravity, Urine: 1.019 (ref 1.001–1.035)
pH: 6 (ref 5.0–8.0)

## 2016-09-22 LAB — TSH: TSH: 1.05 mIU/L

## 2016-09-22 LAB — INSULIN, FASTING: Insulin fasting, serum: 24 u[IU]/mL — ABNORMAL HIGH (ref 2.0–19.6)

## 2016-12-24 ENCOUNTER — Ambulatory Visit: Payer: Self-pay | Admitting: Physician Assistant

## 2017-03-09 ENCOUNTER — Emergency Department (HOSPITAL_COMMUNITY)

## 2017-03-09 ENCOUNTER — Encounter (HOSPITAL_COMMUNITY): Payer: Self-pay | Admitting: Emergency Medicine

## 2017-03-09 ENCOUNTER — Emergency Department (HOSPITAL_COMMUNITY)
Admission: EM | Admit: 2017-03-09 | Discharge: 2017-03-09 | Disposition: A | Discharge status: Home / Self Care | Attending: Emergency Medicine | Admitting: Emergency Medicine

## 2017-03-09 DIAGNOSIS — J069 Acute upper respiratory infection, unspecified: Secondary | ICD-10-CM | POA: Insufficient documentation

## 2017-03-09 DIAGNOSIS — I10 Essential (primary) hypertension: Secondary | ICD-10-CM | POA: Insufficient documentation

## 2017-03-09 DIAGNOSIS — R05 Cough: Secondary | ICD-10-CM | POA: Diagnosis present

## 2017-03-09 DIAGNOSIS — Z79899 Other long term (current) drug therapy: Secondary | ICD-10-CM | POA: Diagnosis not present

## 2017-03-09 DIAGNOSIS — R059 Cough, unspecified: Secondary | ICD-10-CM

## 2017-03-09 MED ORDER — ALBUTEROL SULFATE HFA 108 (90 BASE) MCG/ACT IN AERS
2.0000 | INHALATION_SPRAY | RESPIRATORY_TRACT | Status: DC | PRN
  Administered 2017-03-09: 2 via RESPIRATORY_TRACT
  Filled 2017-03-09: qty 6.7

## 2017-03-09 MED ORDER — ALBUTEROL SULFATE (2.5 MG/3ML) 0.083% IN NEBU
5.0000 mg | INHALATION_SOLUTION | Freq: Once | RESPIRATORY_TRACT | Status: AC
  Administered 2017-03-09: 5 mg via RESPIRATORY_TRACT
  Filled 2017-03-09: qty 6

## 2017-03-09 MED ORDER — AZITHROMYCIN 250 MG PO TABS: 250.0000 mg | ORAL_TABLET | Freq: Every day | ORAL | 0 refills | Status: DC

## 2017-03-09 MED ORDER — BENZONATATE 100 MG PO CAPS: 100.0000 mg | ORAL_CAPSULE | Freq: Three times a day (TID) | ORAL | 0 refills | Status: DC

## 2017-03-09 MED ORDER — IPRATROPIUM BROMIDE 0.02 % IN SOLN
0.5000 mg | Freq: Once | RESPIRATORY_TRACT | Status: AC
  Administered 2017-03-09: 0.5 mg via RESPIRATORY_TRACT
  Filled 2017-03-09: qty 2.5

## 2017-03-09 NOTE — Discharge Instructions (Signed)
Take the prescribed medication as directed.  Use inhaler when needed for wheezing. Follow-up with your primary care doctor. Return to the ED for new or worsening symptoms.

## 2017-03-09 NOTE — ED Provider Notes (Signed)
[facility] DEPT Provider Note   CSN: [id number] Arrival date & time: 03/09/17  0557     History   Chief Complaint Chief Complaint  Patient presents with  . URI    HPI Sonya Peterson is a 61 y.o. female.  The history is provided by the patient and medical records.    61 year old female with history of seasonal allergies, depression, hyperlipidemia, hypertension, vitamin D deficiency, intermittent asthma, presenting to the ED with URI type symptoms.  States this is been ongoing for about 3 days now. Specifically she reports she has had fever, sore throat, nonproductive cough, bilateral ear pain, pressure in her eyes and sensation of chest congestion. She denies any chest pain. She has noted some wheezing but denies any shortness of breath. States she does take Singulair for her allergies and asthma, but does not use any albuterol inhalers or other breathing treatments at home. She denies any sick contacts. States she does get "sinus issues" pretty frequently.  Past Medical History:  Diagnosis Date  . Allergy   . Depression   . Hyperlipidemia   . Hypertension   . Obesity (BMI 30.0-34.9)   . Prediabetes 11/09/2013  . Vitamin D deficiency     Patient Active Problem List   Diagnosis Date Noted  . Obesity 10/04/2014  . Prediabetes 11/09/2013  . Hyperlipidemia   . Hypertension   . Allergy   . Vitamin D deficiency   . Depression     Past Surgical History:  Procedure Laterality Date  . ACHILLES TENDON SURGERY    . BACK SURGERY    . DILATION AND CURETTAGE OF UTERUS    . HEEL SPUR SURGERY Left 2015  . [place] SURGERY  2004  . TUBAL LIGATION      OB History    No data available       Home Medications    Prior to Admission medications   Medication Sig Start Date End Date Taking? Authorizing Provider  Cholecalciferol (VITAMIN D PO) Take 5,000 Int'l Units by mouth.    Historical Provider, MD  FLUoxetine (PROZAC) 20 MG capsule TAKE 3 CAPSULES (60 MG) DAILY FOR  MOOD 09/21/16   Vicie Mutters, PA-C  loratadine (CLARITIN) 10 MG tablet TAKE 1 TABLET DAILY FOR ALLERGIES 09/21/16   Vicie Mutters, PA-C  meloxicam (MOBIC) 7.5 MG tablet Take 1 tablet (7.5 mg total) by mouth daily. 09/21/16   Vicie Mutters, PA-C  montelukast (SINGULAIR) 10 MG tablet Take 1 tablet (10 mg total) by mouth daily. 09/21/16   Vicie Mutters, PA-C  simvastatin (ZOCOR) 40 MG tablet TAKE 1 TABLET AT BEDTIME 09/21/16   Vicie Mutters, PA-C    Family History Family History  Problem Relation Age of Onset  . Cancer Mother 72    breast  . Hypertension Mother   . Diabetes Mother     Social History Social History  Substance Use Topics  . Smoking status: Never Smoker  . Smokeless tobacco: Never Used  . Alcohol use No     Allergies   Patient has no known allergies.   Review of Systems Review of Systems  Constitutional: Positive for fever.  HENT: Positive for congestion, ear pain, sinus pressure and sore throat.   Eyes: Positive for pain.  Respiratory: Positive for cough and wheezing.   All other systems reviewed and are negative.    Physical Exam Updated Vital Signs BP (!) 168/88 (BP Location: Left Arm)   Pulse 96   Temp 99.2 F (37.3 C) (Oral)  Resp 16   Ht 5' 6.5" (1.689 m)   Wt 86.2 kg   SpO2 99%   BMI 30.21 kg/m   Physical Exam  Constitutional: She is oriented to person, place, and time. She appears well-developed and well-nourished.  HENT:  Head: Normocephalic and atraumatic.  Right Ear: Tympanic membrane normal.  Left Ear: Tympanic membrane normal.  Nose: Mucosal edema present.  Mouth/Throat: Oropharynx is clear and moist.  TMs normal in appearance, slight erythema of EAC's bilaterally, + nasal congestion with PND + sinus tenderness, mostly maxillary  Eyes: Conjunctivae and EOM are normal. Pupils are equal, round, and reactive to light.  Neck: Normal range of motion.  Cardiovascular: Normal rate, regular rhythm and normal heart sounds.     Pulmonary/Chest: Effort normal. She has wheezes.  No distress, expiratory wheezes noted on right, no acute distress, able to speak in full sentences without difficulty  Abdominal: Soft. Bowel sounds are normal.  Musculoskeletal: Normal range of motion.  Neurological: She is alert and oriented to person, place, and time.  Skin: Skin is warm and dry.  Psychiatric: She has a normal mood and affect.  Nursing note and vitals reviewed.    ED Treatments / Results  Labs (all labs ordered are listed, but only abnormal results are displayed) Labs Reviewed - No data to display  EKG  EKG Interpretation None       Radiology Dg Chest 2 View  Result Date: 03/09/2017 CLINICAL DATA:  Cough.  Wheezing EXAM: CHEST  2 VIEW COMPARISON:  07/28/2004 FINDINGS: Normal heart size and mediastinal contours. No acute infiltrate or edema. No effusion or pneumothorax. No acute osseous findings. IMPRESSION: No evidence of active disease. Electronically Signed   By: Monte Fantasia M.D.   On: 03/09/2017 07:22    Procedures Procedures (including critical care time)  Medications Ordered in ED Medications  albuterol (PROVENTIL HFA;VENTOLIN HFA) 108 (90 Base) MCG/ACT inhaler 2 puff (2 puffs Inhalation Given 03/09/17 0833)  albuterol (PROVENTIL) (2.5 MG/3ML) 0.083% nebulizer solution 5 mg (5 mg Nebulization Given 03/09/17 0748)  ipratropium (ATROVENT) nebulizer solution 0.5 mg (0.5 mg Nebulization Given 03/09/17 0748)     Initial Impression / Assessment and Plan / ED Course  I have reviewed the triage vital signs and the nursing notes.  Pertinent labs & imaging results that were available during my care of the patient were reviewed by me and considered in my medical decision making (see chart for details).  61 year old female here with URI type symptoms for the past 3 days. She does have a low-grade fever here but is nontoxic in appearance. She does have some nasal congestion, postnasal drip, and sinus tenderness.  Some mild wheezes, more pronounced on the right. She is in no acute respiratory distress. O2 sat stable. Chest x-ray was obtained which is negative for any acute findings. Wheezes resolved after nebulizer treatment here. Suspect upper respiratory infection. Vitals remain stable.  Given her sinus tenderness and pulmonary symptoms, will treat with abx.  Started on azithromycin, tessalon for cough.  Given albuterol inhaler in ED and instructed on use.  Work note provided.  Close PCP follow-up.  Discussed plan with patient, she acknowledged understanding and agreed with plan of care.  Return precautions given for new or worsening symptoms.  Final Clinical Impressions(s) / ED Diagnoses   Final diagnoses:  Upper respiratory tract infection, unspecified type  Cough    New Prescriptions Discharge Medication List as of 03/09/2017  8:13 AM    START taking these medications  Details  azithromycin (ZITHROMAX) 250 MG tablet Take 1 tablet (250 mg total) by mouth daily. Take first 2 tablets together, then 1 every day until finished., Starting Tue 03/09/2017, Print    benzonatate (TESSALON) 100 MG capsule Take 1 capsule (100 mg total) by mouth every 8 (eight) hours., Starting Tue 03/09/2017, Print         Larene Pickett, PA-C 03/09/17 1643    Ezequiel Essex, MD 03/09/17 [phone]

## 2017-03-09 NOTE — ED Triage Notes (Signed)
Pt is c/o fever, sore throat, cough, ear pain, eyes hurt, and chest soreness from coughing

## 2017-03-09 NOTE — ED Notes (Signed)
Patient is A & O x4.  She understood AVS instructions without any questions.

## 2017-03-16 ENCOUNTER — Encounter: Payer: Self-pay | Admitting: Physician Assistant

## 2017-03-16 ENCOUNTER — Ambulatory Visit (INDEPENDENT_AMBULATORY_CARE_PROVIDER_SITE_OTHER): Admitting: Physician Assistant

## 2017-03-16 VITALS — BP 140/80 | HR 67 | Temp 97.3°F | Resp 16 | Ht 65.0 in | Wt 197.8 lb

## 2017-03-16 DIAGNOSIS — I10 Essential (primary) hypertension: Secondary | ICD-10-CM

## 2017-03-16 DIAGNOSIS — E6609 Other obesity due to excess calories: Secondary | ICD-10-CM

## 2017-03-16 DIAGNOSIS — R7303 Prediabetes: Secondary | ICD-10-CM

## 2017-03-16 DIAGNOSIS — Z79899 Other long term (current) drug therapy: Secondary | ICD-10-CM

## 2017-03-16 DIAGNOSIS — F3341 Major depressive disorder, recurrent, in partial remission: Secondary | ICD-10-CM | POA: Diagnosis not present

## 2017-03-16 DIAGNOSIS — E559 Vitamin D deficiency, unspecified: Secondary | ICD-10-CM

## 2017-03-16 DIAGNOSIS — T7840XA Allergy, unspecified, initial encounter: Secondary | ICD-10-CM

## 2017-03-16 DIAGNOSIS — Z6833 Body mass index (BMI) 33.0-33.9, adult: Secondary | ICD-10-CM

## 2017-03-16 DIAGNOSIS — E785 Hyperlipidemia, unspecified: Secondary | ICD-10-CM

## 2017-03-16 LAB — CBC WITH DIFFERENTIAL/PLATELET
BASOS PCT: 0 %
Basophils Absolute: 0 cells/uL (ref 0–200)
EOS ABS: 186 {cells}/uL (ref 15–500)
Eosinophils Relative: 3 %
HEMATOCRIT: 42.9 % (ref 35.0–45.0)
Hemoglobin: 14.6 g/dL (ref 11.7–15.5)
LYMPHS ABS: 2418 {cells}/uL (ref 850–3900)
Lymphocytes Relative: 39 %
MCH: 29.7 pg (ref 27.0–33.0)
MCHC: 34 g/dL (ref 32.0–36.0)
MCV: 87.2 fL (ref 80.0–100.0)
MONO ABS: 496 {cells}/uL (ref 200–950)
MPV: 9.3 fL (ref 7.5–12.5)
Monocytes Relative: 8 %
NEUTROS ABS: 3100 {cells}/uL (ref 1500–7800)
Neutrophils Relative %: 50 %
Platelets: 245 10*3/uL (ref 140–400)
RBC: 4.92 MIL/uL (ref 3.80–5.10)
RDW: 13.8 % (ref 11.0–15.0)
WBC: 6.2 10*3/uL (ref 3.8–10.8)

## 2017-03-16 MED ORDER — MONTELUKAST SODIUM 10 MG PO TABS: 10.0000 mg | ORAL_TABLET | Freq: Every day | ORAL | 2 refills | Status: DC

## 2017-03-16 MED ORDER — MELOXICAM 7.5 MG PO TABS: 7.5000 mg | ORAL_TABLET | Freq: Every day | ORAL | 1 refills | Status: DC

## 2017-03-16 MED ORDER — PREDNISONE 20 MG PO TABS: ORAL_TABLET | ORAL | 0 refills | Status: DC

## 2017-03-16 MED ORDER — FLUOXETINE HCL 20 MG PO CAPS: ORAL_CAPSULE | ORAL | 1 refills | Status: DC

## 2017-03-16 MED ORDER — SIMVASTATIN 40 MG PO TABS: ORAL_TABLET | ORAL | 1 refills | Status: DC

## 2017-03-16 NOTE — Patient Instructions (Addendum)
Make sure you are on an allergy pill, see below for more details. Please take the prednisone as directed below, this is NOT an antibiotic so you do NOT have to finish it. You can take it for a few days and stop it if you are doing better.   Please take the prednisone to help decrease inflammation and therefore decrease symptoms. Take it it with food to avoid GI upset. It can cause increased energy but on the other hand it can make it hard to sleep at night so please take it AT [place], it takes 8-12 hours to start working so it will NOT affect your sleeping if you take it at night with your food!!  If you are diabetic it will increase your sugars so decrease carbs and monitor your sugars closely.       Monitor your blood pressure at home. Go to the ER if any CP, SOB, nausea, dizziness, severe HA, changes vision/speech  Goal BP:  For patients younger than 60: Goal BP < 140/90. For patients 60 and older: Goal BP < 150/90. For patients with diabetes: Goal BP < 140/90. Your most recent BP: BP: 140/80   Take your medications faithfully as instructed. Maintain a healthy weight. Get at least 150 minutes of aerobic exercise per week. Minimize salt intake. Minimize alcohol intake  DASH Eating Plan DASH stands for "Dietary Approaches to Stop Hypertension." The DASH eating plan is a healthy eating plan that has been shown to reduce high blood pressure (hypertension). Additional health benefits may include reducing the risk of type 2 diabetes mellitus, heart disease, and stroke. The DASH eating plan may also help with weight loss. WHAT DO I NEED TO KNOW ABOUT THE DASH EATING PLAN? For the DASH eating plan, you will follow these general guidelines:  Choose foods with a percent daily value for sodium of less than 5% (as listed on the food label).  Use salt-free seasonings or herbs instead of table salt or sea salt.  Check with your health care provider or pharmacist before using salt  substitutes.  Eat lower-sodium products, often labeled as "lower sodium" or "no salt added."  Eat fresh foods.  Eat more vegetables, fruits, and low-fat dairy products.  Choose whole grains. Look for the word "whole" as the first word in the ingredient list.  Choose fish and skinless chicken or [place] more often than red meat. Limit fish, poultry, and meat to 6 oz (170 g) each day.  Limit sweets, desserts, sugars, and sugary drinks.  Choose heart-healthy fats.  Limit cheese to 1 oz (28 g) per day.  Eat more home-cooked food and less restaurant, buffet, and fast food.  Limit fried foods.  Cook foods using methods other than frying.  Limit canned vegetables. If you do use them, rinse them well to decrease the sodium.  When eating at a restaurant, ask that your food be prepared with less salt, or no salt if possible. WHAT FOODS CAN I EAT? Seek help from a dietitian for individual calorie needs. Grains Whole grain or whole wheat bread. Brown rice. Whole grain or whole wheat pasta. Quinoa, bulgur, and whole grain cereals. Low-sodium cereals. Corn or whole wheat flour tortillas. Whole grain cornbread. Whole grain crackers. Low-sodium crackers. Vegetables Fresh or frozen vegetables (raw, steamed, roasted, or grilled). Low-sodium or reduced-sodium tomato and vegetable juices. Low-sodium or reduced-sodium tomato sauce and paste. Low-sodium or reduced-sodium canned vegetables.  Fruits All fresh, canned (in natural juice), or frozen fruits. Meat and  Other Protein Products Ground beef (85% or leaner), grass-fed beef, or beef trimmed of fat. Skinless chicken or [place]. Ground chicken or [place]. Pork trimmed of fat. All fish and seafood. Eggs. Dried beans, peas, or lentils. Unsalted nuts and seeds. Unsalted canned beans. Dairy Low-fat dairy products, such as skim or 1% milk, 2% or reduced-fat cheeses, low-fat ricotta or cottage cheese, or plain low-fat yogurt. Low-sodium or reduced-sodium  cheeses. Fats and Oils Tub margarines without trans fats. Light or reduced-fat mayonnaise and salad dressings (reduced sodium). Avocado. Safflower, olive, or canola oils. Natural peanut or almond butter. Other Unsalted popcorn and pretzels. The items listed above may not be a complete list of recommended foods or beverages. Contact your dietitian for more options. WHAT FOODS ARE NOT RECOMMENDED? Grains White bread. White pasta. White rice. Refined cornbread. Bagels and croissants. Crackers that contain trans fat. Vegetables Creamed or fried vegetables. Vegetables in a cheese sauce. Regular canned vegetables. Regular canned tomato sauce and paste. Regular tomato and vegetable juices. Fruits Dried fruits. Canned fruit in light or heavy syrup. Fruit juice. Meat and Other Protein Products Fatty cuts of meat. Ribs, chicken wings, bacon, sausage, bologna, salami, chitterlings, fatback, hot dogs, bratwurst, and packaged luncheon meats. Salted nuts and seeds. Canned beans with salt. Dairy Whole or 2% milk, cream, half-and-half, and cream cheese. Whole-fat or sweetened yogurt. Full-fat cheeses or blue cheese. Nondairy creamers and whipped toppings. Processed cheese, cheese spreads, or cheese curds. Condiments Onion and garlic salt, seasoned salt, table salt, and sea salt. Canned and packaged gravies. Worcestershire sauce. Tartar sauce. Barbecue sauce. Teriyaki sauce. Soy sauce, including reduced sodium. Steak sauce. Fish sauce. Oyster sauce. Cocktail sauce. Horseradish. Ketchup and mustard. Meat flavorings and tenderizers. Bouillon cubes. Hot sauce. Tabasco sauce. Marinades. Taco seasonings. Relishes. Fats and Oils Butter, stick margarine, lard, shortening, ghee, and bacon fat. Coconut, palm kernel, or palm oils. Regular salad dressings. Other Pickles and olives. Salted popcorn and pretzels. The items listed above may not be a complete list of foods and beverages to avoid. Contact your dietitian for  more information. WHERE CAN I FIND MORE INFORMATION? National Heart, Lung, and Blood Institute: travelstabloid.com Document Released: 11/12/2011 Document Revised: 04/09/2014 Document Reviewed: 09/27/2013 [facility] Patient Information 2015 [place], [place]. This information is not intended to replace advice given to you by your health care provider. Make sure you discuss any questions you have with your health care provider.   HOW TO TREAT VIRAL COUGH AND COLD SYMPTOMS:  -Symptoms usually last at least 1 week with the worst symptoms being around day 4.  - colds usually start with a sore throat and end with a cough, and the cough can take 2 weeks to get better.  -No antibiotics are needed for colds, flu, sore throats, cough, bronchitis UNLESS symptoms are longer than 7 days OR if you are getting better then get drastically worse.  -There are a lot of combination medications (Dayquil, Nyquil, Vicks 44, tyelnol cold and sinus, ETC). Please look at the ingredients on the back so that you are treating the correct symptoms and not doubling up on medications/ingredients.    Medicines you can use  Nasal congestion  - pseudoephedrine (Sudafed)- behind the counter, do not use if you have high blood pressure, medicine that have -D in them.  - phenylephrine (Sudafed PE) -Dextormethorphan + chlorpheniramine (Coridcidin HBP)- okay if you have high blood pressure -Oxymetazoline (Afrin) nasal spray- LIMIT to 3 days -Saline nasal spray -Neti pot (used distilled or bottled water)  Ear pain/congestion  -  pseudoephedrine (sudafed) - Nasonex/flonase nasal spray  Fever  -Acetaminophen (Tyelnol) -Ibuprofen (Advil, motrin, aleve)  Sore Throat  -Acetaminophen (Tyelnol) -Ibuprofen (Advil, motrin, aleve) -Drink a lot of water -Gargle with salt water - Rest your voice (don't talk) -Throat sprays -Cough drops  Body Aches  -Acetaminophen (Tyelnol) -Ibuprofen (Advil, motrin,  aleve)  Headache  -Acetaminophen (Tyelnol) -Ibuprofen (Advil, motrin, aleve) - Exedrin, Exedrin Migraine  Allergy symptoms (cough, sneeze, runny nose, itchy eyes) -Claritin or loratadine cheapest but likely the weakest  -Zyrtec or certizine at night because it can make you sleepy -The strongest is allegra or fexafinadine  Cheapest at [employer], [employer], [employer]  Cough  -Dextromethorphan (Delsym)- medicine that has DM in it -Guafenesin (Mucinex/Robitussin) - cough drops - drink lots of water  Chest Congestion  -Guafenesin (Mucinex/Robitussin)  Red Itchy Eyes  - Naphcon-A  Upset Stomach  - Bland diet (nothing spicy, greasy, fried, and high acid foods like tomatoes, oranges, berries) -OKAY- cereal, bread, soup, crackers, rice -Eat smaller more frequent meals -reduce caffeine, no alcohol -Loperamide (Imodium-AD) if diarrhea -Prevacid for heart burn  General health when sick  -Hydration -wash your hands frequently -keep surfaces clean -change pillow cases and sheets often -Get fresh air but do not exercise strenuously -Vitamin D, double up on it - Vitamin C -Zinc

## 2017-03-16 NOTE — Progress Notes (Signed)
Assessment and Plan:  Hypertension  -Continue medication, get a monitor and start to check blood pressure at home, if continue to be above 140/90 call the office and make appointment. Continue DASH diet.  Reminder to go to the ER if any CP, SOB, nausea, dizziness, severe HA, changes vision/speech, left arm numbness and tingling, and jaw pain.  Cholesterol  -Continue diet and exercise. Check cholesterol.   Pre-diabetes -Continue diet and exercise. Check A1C   Vitamin D Def - check level and continue medications.    Obesity with co morbidities - long discussion about weight loss, diet, and exercise  URI Prednisone if she needs it, use albuterol inhaler, refilled Singulair  Refilled all meds  Continue diet and meds as discussed. Further disposition pending results of labs. Future Appointments Date Time Provider [place]  10/20/2017 2:00 PM Vicie Mutters, PA-C [id number] None    HPI 61 y.o. female  presents for 3 month follow up with hypertension, hyperlipidemia, prediabetes and vitamin D. She was seen in the ER for URI 03/09/2017 with zpak, had breathing treatment in office and has albuterol inhaler, feeling better other than fatigue and still some cough with no mucus, no fever, SOB, CP  Her blood pressure has not been checked at home, today their BP is BP: 140/80  BP Readings from Last 5 Encounters:  03/16/17 140/80  03/09/17 (!) 155/81  09/21/16 134/88  03/30/16 (!) 145/90  07/24/15 (!) 142/70   She does not workout.  She denies chest pain, shortness of breath, dizziness.  She is on cholesterol medication.  zocor 40mg   and denies myalgias. Her cholesterol is at goal. The cholesterol last visit was:   Lab Results  Component Value Date   CHOL 136 09/21/2016   HDL 35 (L) 09/21/2016   LDLCALC 75 09/21/2016   TRIG 128 09/21/2016   CHOLHDL 3.9 09/21/2016   She has been working on diet and exercise for prediabetes, and denies paresthesia of the feet, polydipsia,  polyuria and visual disturbances. Last A1C in the office was:  Lab Results  Component Value Date   HGBA1C 5.6 09/21/2016   Patient is on Vitamin D supplement.   Lab Results  Component Value Date   VD25OH 54 07/01/2015     BMI is Body mass index is 32.92 kg/m., she is struggling with weight loss due to time limitations. Wt Readings from Last 3 Encounters:  03/16/17 197 lb 12.8 oz (89.7 kg)  03/09/17 190 lb (86.2 kg)  09/21/16 206 lb (93.4 kg)    Current Medications:  Current Outpatient Prescriptions on File Prior to Visit  Medication Sig Dispense Refill  . Cholecalciferol (VITAMIN D PO) Take 5,000 Int'l Units by mouth.    Marland Kitchen FLUoxetine (PROZAC) 20 MG capsule TAKE 3 CAPSULES (60 MG) DAILY FOR MOOD 270 capsule 1  . loratadine (CLARITIN) 10 MG tablet TAKE 1 TABLET DAILY FOR ALLERGIES 90 tablet 1  . meloxicam (MOBIC) 7.5 MG tablet Take 1 tablet (7.5 mg total) by mouth daily. 90 tablet 1  . montelukast (SINGULAIR) 10 MG tablet Take 1 tablet (10 mg total) by mouth daily. 90 tablet 2  . simvastatin (ZOCOR) 40 MG tablet TAKE 1 TABLET AT BEDTIME 90 tablet 1   No current facility-administered medications on file prior to visit.    Medical History:  Past Medical History:  Diagnosis Date  . Allergy   . Depression   . Hyperlipidemia   . Hypertension   . Obesity (BMI 30.0-34.9)   . Prediabetes 11/09/2013  .  Vitamin D deficiency    Allergies: No Known Allergies   Review of Systems  Constitutional: Positive for malaise/fatigue. Negative for chills, diaphoresis, fever and weight loss.  HENT: Negative.   Eyes: Negative.   Respiratory: Positive for cough. Negative for hemoptysis, sputum production, shortness of breath and wheezing.   Cardiovascular: Negative.   Gastrointestinal: Negative.   Genitourinary: Negative.   Musculoskeletal: Negative.   Skin: Negative.   Neurological: Negative.  Negative for weakness.  Endo/Heme/Allergies: Negative.   Psychiatric/Behavioral: Negative.      Family history- Review and unchanged Social history- Review and unchanged Physical Exam: BP 140/80   Pulse 67   Temp 97.3 F (36.3 C)   Resp 16   Ht 5\' 5"  (1.651 m)   Wt 197 lb 12.8 oz (89.7 kg)   SpO2 95%   BMI 32.92 kg/m  Wt Readings from Last 3 Encounters:  03/16/17 197 lb 12.8 oz (89.7 kg)  03/09/17 190 lb (86.2 kg)  09/21/16 206 lb (93.4 kg)   General Appearance: Well nourished, in no apparent distress. Eyes: PERRLA, EOMs, conjunctiva no swelling or erythema Sinuses: No Frontal/maxillary tenderness ENT/Mouth: Ext aud canals clear, TMs without erythema, bulging. No erythema, swelling, or exudate on post pharynx.  Tonsils not swollen or erythematous. Hearing normal.  Neck: Supple, thyroid normal.  Respiratory: Respiratory effort normal, BS equal bilaterally with wheezing without rales, rhonchi, or stridor.  Cardio: RRR with no MRGs. Brisk peripheral pulses without edema.  Abdomen: Soft, + BS.  Non tender, no guarding, rebound, hernias, masses. Lymphatics: Non tender without lymphadenopathy.  Musculoskeletal: Full ROM, 5/5 strength, normal gait.  Skin:  Warm, dry without rashes, lesions, ecchymosis.  Neuro: Cranial nerves intact. Normal muscle tone, no cerebellar symptoms. Sensation intact.  Psych: Awake and oriented X 3, normal affect, Insight and Judgment appropriate.    Vicie Mutters, PA-C 4:20 PM [facility]

## 2017-03-17 LAB — BASIC METABOLIC PANEL WITH GFR
BUN: 14 mg/dL (ref 7–25)
CHLORIDE: 103 mmol/L (ref 98–110)
CO2: 26 mmol/L (ref 20–31)
Calcium: 8.8 mg/dL (ref 8.6–10.4)
Creat: 0.71 mg/dL (ref 0.50–1.05)
GLUCOSE: 97 mg/dL (ref 65–99)
POTASSIUM: 3.4 mmol/L — AB (ref 3.5–5.3)
Sodium: 141 mmol/L (ref 135–146)

## 2017-03-17 LAB — LIPID PANEL
Cholesterol: 120 mg/dL (ref <200)
HDL: 33 mg/dL — AB (ref >50)
LDL CALC: 60 mg/dL (ref <100)
TRIGLYCERIDES: 135 mg/dL (ref <150)
Total CHOL/HDL Ratio: 3.6 Ratio (ref <5.0)
VLDL: 27 mg/dL (ref <30)

## 2017-03-17 LAB — TSH: TSH: 0.95 mIU/L

## 2017-03-17 LAB — HEPATIC FUNCTION PANEL
ALBUMIN: 4.4 g/dL (ref 3.6–5.1)
ALK PHOS: 76 U/L (ref 33–130)
ALT: 13 U/L (ref 6–29)
AST: 14 U/L (ref 10–35)
BILIRUBIN INDIRECT: 0.2 mg/dL (ref 0.2–1.2)
BILIRUBIN TOTAL: 0.3 mg/dL (ref 0.2–1.2)
Bilirubin, Direct: 0.1 mg/dL (ref <=0.2)
TOTAL PROTEIN: 7.3 g/dL (ref 6.1–8.1)

## 2017-03-17 LAB — MAGNESIUM: MAGNESIUM: 2 mg/dL (ref 1.5–2.5)

## 2017-03-19 NOTE — Progress Notes (Signed)
LVM for pt to return office call for LAB results.

## 2017-03-23 NOTE — Progress Notes (Signed)
Pt aware of lab results & voiced understanding of those results.

## 2017-04-05 ENCOUNTER — Ambulatory Visit (INDEPENDENT_AMBULATORY_CARE_PROVIDER_SITE_OTHER): Admitting: Orthopedic Surgery

## 2017-04-05 ENCOUNTER — Encounter (INDEPENDENT_AMBULATORY_CARE_PROVIDER_SITE_OTHER): Payer: Self-pay | Admitting: Orthopedic Surgery

## 2017-04-05 VITALS — Ht 65.0 in | Wt 197.0 lb

## 2017-04-05 DIAGNOSIS — T8189XS Other complications of procedures, not elsewhere classified, sequela: Secondary | ICD-10-CM | POA: Diagnosis not present

## 2017-04-05 DIAGNOSIS — Z189 Retained foreign body fragments, unspecified material: Secondary | ICD-10-CM

## 2017-04-05 DIAGNOSIS — T8140XA Infection following a procedure, unspecified, initial encounter: Secondary | ICD-10-CM | POA: Insufficient documentation

## 2017-04-05 NOTE — Progress Notes (Signed)
Office Visit Note   Patient: Sonya Peterson           Date of Birth: 11-Oct-1956           MRN: [id number] Visit Date: 04/05/2017              Requested by: Unk Pinto, MD [address] [place], [place] [address] PCP: Alesia Richards, MD  Chief Complaint  Patient presents with  . Right Foot - Follow-up    3 yrs s/p achilles recon      HPI: Patient is status post reconstruction Achilles tendon 3 years ago. Patient states that recently she has had a small amount of drainage from the surgical incision.  Assessment & Plan: Visit Diagnoses:  1. Retained suture, sequela     Plan: After informed consent the area was sterilely prepped with alcohol a needle driver was used and a FiberWire suture knot was removed. Bactroban and a Band-Aid was applied.  Follow-Up Instructions: Return if symptoms worsen or fail to improve.   Ortho Exam  Patient is alert, oriented, no adenopathy, well-dressed, normal affect, normal respiratory effort. Examination patient is a very small scab on the posterior aspect of her surgical incision this was about 3 mm in diameter. After informed consent sterile prep with alcohol the scab was removed there was very small amount of serosanguineous drainage about it dropped. With using a needle driver a suture knot was grasped and removed. A Band-Aid and Bactroban was applied patient tolerated this without problems.  Imaging: No results found.  Labs: Lab Results  Component Value Date   HGBA1C 5.6 09/21/2016   HGBA1C 5.9 (H) 07/01/2015   HGBA1C 6.0 (H) 03/28/2015    Orders:  No orders of the defined types were placed in this encounter.  No orders of the defined types were placed in this encounter.    Procedures: No procedures performed  Clinical Data: No additional findings.  ROS:  All other systems negative, except as noted in the HPI. Review of Systems  Objective: Vital Signs: Ht 5\' 5"  (1.651 m)   Wt 197 lb (89.4  kg)   BMI 32.78 kg/m   Specialty Comments:  No specialty comments available.  PMFS History: Patient Active Problem List   Diagnosis Date Noted  . Retained suture 04/05/2017  . Obesity 10/04/2014  . Prediabetes 11/09/2013  . Hyperlipidemia   . Hypertension   . Allergy   . Vitamin D deficiency   . Depression    Past Medical History:  Diagnosis Date  . Allergy   . Depression   . Hyperlipidemia   . Hypertension   . Obesity (BMI 30.0-34.9)   . Prediabetes 11/09/2013  . Vitamin D deficiency     Family History  Problem Relation Age of Onset  . Cancer Mother 43    breast  . Hypertension Mother   . Diabetes Mother     Past Surgical History:  Procedure Laterality Date  . ACHILLES TENDON SURGERY    . BACK SURGERY    . DILATION AND CURETTAGE OF UTERUS    . HEEL SPUR SURGERY Left 2015  . [place] SURGERY  2004  . TUBAL LIGATION     Social History   Occupational History  . Not on file.   Social History Main Topics  . Smoking status: Never Smoker  . Smokeless tobacco: Never Used  . Alcohol use No  . Drug use: No  . Sexual activity: Not on file

## 2017-04-06 ENCOUNTER — Encounter: Payer: Self-pay | Admitting: Physician Assistant

## 2017-05-04 ENCOUNTER — Ambulatory Visit (INDEPENDENT_AMBULATORY_CARE_PROVIDER_SITE_OTHER): Admitting: Family

## 2017-05-04 ENCOUNTER — Encounter (INDEPENDENT_AMBULATORY_CARE_PROVIDER_SITE_OTHER): Payer: Self-pay | Admitting: Family

## 2017-05-04 VITALS — Ht 65.0 in | Wt 197.0 lb

## 2017-05-04 DIAGNOSIS — M25562 Pain in left knee: Secondary | ICD-10-CM | POA: Diagnosis not present

## 2017-05-04 NOTE — Progress Notes (Signed)
Office Visit Note   Patient: Sonya Peterson           Date of Birth: 02-22-1956           MRN: [id number] Visit Date: 05/04/2017              Requested by: Unk Pinto, [place], [place] [address] PCP: Unk Pinto, MD  Chief Complaint  Patient presents with  . Left Knee - Pain      HPI: The patient is a 61 year old woman who presents today complaining of left knee pain. Since she was at work last Thursday when she heard a pop and states her knee gave out. However she did not fall. States she is unable to weight-bear initially. She was seen and evaluated at the [facility] urgent care. Did have radiographs these were reviewed today. No fracture abnormality. does have medial joint space narrowing. Denies chronic knee pain.   Today is in a knee immobilizer. Has been doing well. Complains of minimal pain with weight bearing in immobilizer. +sweling.  Lateral pain  Assessment & Plan: Visit Diagnoses: No diagnosis found.  Plan: doubt ligamentous injury. will proceed with depomedrol injection. Follow up in office in 4 weeks as needed if continued pain may order mri to r/o meniscal injury. Given an out of work note for 1 week. Given a hinged knee brace for support.   Follow-Up Instructions: No Follow-up on file.   Left Knee Exam   Tenderness  The patient is experiencing tenderness in the lateral joint line.  Range of Motion  The patient has normal left knee ROM. Left knee extension: end extension painful.   Tests  Drawer:       Anterior - negative     Posterior - negative Varus: negative Valgus: negative  Other  Erythema: absent Swelling: mild Effusion: no effusion present      Patient is alert, oriented, no adenopathy, well-dressed, normal affect, normal respiratory effort.   Imaging: No results found.  Labs: Lab Results  Component Value Date   HGBA1C 5.6 09/21/2016   HGBA1C 5.9 (H) 07/01/2015   HGBA1C 6.0 (H)  03/28/2015    Orders:  No orders of the defined types were placed in this encounter.  No orders of the defined types were placed in this encounter.    Procedures: No procedures performed  Clinical Data: No additional findings.  ROS:  All other systems negative, except as noted in the HPI. Review of Systems  Constitutional: Negative for chills and fever.  Musculoskeletal: Positive for arthralgias and joint swelling.  Skin: Negative for color change.    Objective: Vital Signs: Ht 5\' 5"  (1.651 m)   Wt 197 lb (89.4 kg)   BMI 32.78 kg/m   Specialty Comments:  No specialty comments available.  PMFS History: Patient Active Problem List   Diagnosis Date Noted  . Retained suture 04/05/2017  . Obesity 10/04/2014  . Prediabetes 11/09/2013  . Hyperlipidemia   . Hypertension   . Allergy   . Vitamin D deficiency   . Depression    Past Medical History:  Diagnosis Date  . Allergy   . Depression   . Hyperlipidemia   . Hypertension   . Obesity (BMI 30.0-34.9)   . Prediabetes 11/09/2013  . Vitamin D deficiency     Family History  Problem Relation Age of Onset  . Cancer Mother 10       breast  . Hypertension Mother   . Diabetes  Mother     Past Surgical History:  Procedure Laterality Date  . ACHILLES TENDON SURGERY    . BACK SURGERY    . DILATION AND CURETTAGE OF UTERUS    . HEEL SPUR SURGERY Left 2015  . [place] SURGERY  2004  . TUBAL LIGATION     Social History   Occupational History  . Not on file.   Social History Main Topics  . Smoking status: Never Smoker  . Smokeless tobacco: Never Used  . Alcohol use No  . Drug use: No  . Sexual activity: Not on file

## 2017-05-19 ENCOUNTER — Ambulatory Visit (INDEPENDENT_AMBULATORY_CARE_PROVIDER_SITE_OTHER): Admitting: Family

## 2017-05-19 ENCOUNTER — Encounter (INDEPENDENT_AMBULATORY_CARE_PROVIDER_SITE_OTHER): Payer: Self-pay | Admitting: Orthopedic Surgery

## 2017-05-19 VITALS — Ht 65.0 in | Wt 197.0 lb

## 2017-05-19 DIAGNOSIS — S8992XD Unspecified injury of left lower leg, subsequent encounter: Secondary | ICD-10-CM

## 2017-05-19 MED ORDER — OXYCODONE-ACETAMINOPHEN 5-325 MG PO TABS: 1.0000 | ORAL_TABLET | Freq: Four times a day (QID) | ORAL | 0 refills | Status: DC | PRN

## 2017-05-19 NOTE — Progress Notes (Signed)
Office Visit Note   Patient: Sonya Peterson           Date of Birth: [birth year]           MRN: [id number] Visit Date: 05/19/2017              Requested by: Unk Pinto, [place], [place] [address] PCP: Unk Pinto, MD  Chief Complaint  Patient presents with  . Left Knee - Pain    05/04/17 s/p injection       HPI: The patient is a 61 year old woman who presents today complaining of continued left knee pain. About 4 weeks ago at work she heard a pop and states her knee gave out. However she did not fall. she was seen and evaluated at the [facility] urgent care.   Today is in a hinged knee brace is full weight bearing. Medial pain. Has not had any relief with the depomedrol injection or with Ibu.   Assessment & Plan: Visit Diagnoses:  1. Injury of left knee, subsequent encounter     Plan: order mri to r/o meniscal or ligamentous injury. Given an out of work note for 2 weeks.   Follow-Up Instructions: Return for mri review.   Left Knee Exam   Tenderness  The patient is experiencing tenderness in the medial retinaculum and medial joint line.  Range of Motion  The patient has normal left knee ROM. Left knee extension: end extension painful.   Other  Erythema: absent Swelling: mild Effusion: no effusion present  Comments:  Little laxity with varus stress, has endpoint      Patient is alert, oriented, no adenopathy, well-dressed, normal affect, normal respiratory effort.   Imaging: No results found.  Labs: Lab Results  Component Value Date   HGBA1C 5.6 09/21/2016   HGBA1C 5.9 (H) 07/01/2015   HGBA1C 6.0 (H) 03/28/2015    Orders:  Orders Placed This Encounter  Procedures  . MR Knee Left w/o contrast   Meds ordered this encounter  Medications  . oxyCODONE-acetaminophen (PERCOCET/ROXICET) 5-325 MG tablet    Sig: Take 1 tablet by mouth every 6 (six) hours as needed for severe pain.    Dispense:  30 tablet    Refill:  0     Procedures: No procedures performed  Clinical Data: No additional findings.  ROS:  All other systems negative, except as noted in the HPI. Review of Systems  Constitutional: Negative for chills and fever.  Musculoskeletal: Positive for arthralgias and joint swelling.  Skin: Negative for color change.    Objective: Vital Signs: Ht 5\' 5"  (1.651 m)   Wt 197 lb (89.4 kg)   BMI 32.78 kg/m   Specialty Comments:  No specialty comments available.  PMFS History: Patient Active Problem List   Diagnosis Date Noted  . Retained suture 04/05/2017  . Obesity 10/04/2014  . Prediabetes 11/09/2013  . Hyperlipidemia   . Hypertension   . Allergy   . Vitamin D deficiency   . Depression    Past Medical History:  Diagnosis Date  . Allergy   . Depression   . Hyperlipidemia   . Hypertension   . Obesity (BMI 30.0-34.9)   . Prediabetes 11/09/2013  . Vitamin D deficiency     Family History  Problem Relation Age of Onset  . Cancer Mother 6       breast  . Hypertension Mother   . Diabetes Mother     Past Surgical History:  Procedure Laterality  Date  . ACHILLES TENDON SURGERY    . BACK SURGERY    . DILATION AND CURETTAGE OF UTERUS    . HEEL SPUR SURGERY Left 2015  . [place] SURGERY  2004  . TUBAL LIGATION     Social History   Occupational History  . Not on file.   Social History Main Topics  . Smoking status: Never Smoker  . Smokeless tobacco: Never Used  . Alcohol use No  . Drug use: No  . Sexual activity: Not on file

## 2017-05-25 ENCOUNTER — Ambulatory Visit
Admission: RE | Admit: 2017-05-25 | Discharge: 2017-05-25 | Disposition: A | Discharge status: Home / Self Care | Source: Ambulatory Visit | Attending: Family | Admitting: Family

## 2017-05-25 DIAGNOSIS — S8992XD Unspecified injury of left lower leg, subsequent encounter: Secondary | ICD-10-CM

## 2017-05-31 ENCOUNTER — Ambulatory Visit (INDEPENDENT_AMBULATORY_CARE_PROVIDER_SITE_OTHER): Admitting: Orthopedic Surgery

## 2017-05-31 ENCOUNTER — Other Ambulatory Visit

## 2017-05-31 ENCOUNTER — Encounter (INDEPENDENT_AMBULATORY_CARE_PROVIDER_SITE_OTHER): Payer: Self-pay | Admitting: Orthopedic Surgery

## 2017-05-31 DIAGNOSIS — S8992XD Unspecified injury of left lower leg, subsequent encounter: Secondary | ICD-10-CM | POA: Diagnosis not present

## 2017-05-31 NOTE — Progress Notes (Signed)
Office Visit Note   Patient: Sonya Peterson           Date of Birth: [birth year]           MRN: [id number] Visit Date: 05/31/2017              Requested by: Unk Pinto, [place], [place] [address] PCP: Unk Pinto, MD  Chief Complaint  Patient presents with  . Left Knee - Follow-up    POST MRI Left knee      HPI:  Patient presents in follow-up for left knee pain. She states that several weeks ago the pain was excruciating she states that now it just feels a little sore. She has been using 1 Aleve in the morning.  Assessment & Plan: Visit Diagnoses:  1. Injury of left knee, subsequent encounter     Plan:  Recommended continue with anti-inflammatories she is given a note that she may return to work increase activities as tolerated. Discussed that if the symptoms get worse in the left knee we could consider arthroscopic intervention.  Follow-Up Instructions: Return if symptoms worsen or fail to improve.   Ortho Exam  Patient is alert, oriented, no adenopathy, well-dressed, normal affect, normal respiratory effort.  Examination she has normal gait. Left knee has no effusion there is no redness no cellulitis no bruising. She is minimally tender to palpation of the medial joint line collapse and cruciates are stable. Review of her MRI scan shows edema of the medial tibial plateau a tear of the posterior horn of the medial meniscus and tricompartmental arthritic changes.  Imaging: No results found.  Labs: Lab Results  Component Value Date   HGBA1C 5.6 09/21/2016   HGBA1C 5.9 (H) 07/01/2015   HGBA1C 6.0 (H) 03/28/2015    Orders:  No orders of the defined types were placed in this encounter.  No orders of the defined types were placed in this encounter.    Procedures: No procedures performed  Clinical Data: No additional findings.  ROS:  All other systems negative, except as noted in the HPI. Review of  Systems  Objective: Vital Signs: There were no vitals taken for this visit.  Specialty Comments:  No specialty comments available.  PMFS History: Patient Active Problem List   Diagnosis Date Noted  . Retained suture 04/05/2017  . Obesity 10/04/2014  . Prediabetes 11/09/2013  . Hyperlipidemia   . Hypertension   . Allergy   . Vitamin D deficiency   . Depression    Past Medical History:  Diagnosis Date  . Allergy   . Depression   . Hyperlipidemia   . Hypertension   . Obesity (BMI 30.0-34.9)   . Prediabetes 11/09/2013  . Vitamin D deficiency     Family History  Problem Relation Age of Onset  . Cancer Mother 73       breast  . Hypertension Mother   . Diabetes Mother     Past Surgical History:  Procedure Laterality Date  . ACHILLES TENDON SURGERY    . BACK SURGERY    . DILATION AND CURETTAGE OF UTERUS    . HEEL SPUR SURGERY Left 2015  . [place] SURGERY  2004  . TUBAL LIGATION     Social History   Occupational History  . Not on file.   Social History Main Topics  . Smoking status: Never Smoker  . Smokeless tobacco: Never Used  . Alcohol use No  . Drug use: No  .  Sexual activity: Not on file

## 2017-07-26 ENCOUNTER — Encounter (INDEPENDENT_AMBULATORY_CARE_PROVIDER_SITE_OTHER): Payer: Self-pay | Admitting: Orthopedic Surgery

## 2017-07-26 ENCOUNTER — Ambulatory Visit (INDEPENDENT_AMBULATORY_CARE_PROVIDER_SITE_OTHER): Admitting: Orthopedic Surgery

## 2017-07-26 VITALS — Ht 65.0 in | Wt 197.0 lb

## 2017-07-26 DIAGNOSIS — T814XXS Infection following a procedure, sequela: Secondary | ICD-10-CM | POA: Diagnosis not present

## 2017-07-26 DIAGNOSIS — T8140XS Infection following a procedure, unspecified, sequela: Secondary | ICD-10-CM

## 2017-07-26 MED ORDER — DOXYCYCLINE HYCLATE 100 MG PO TABS: 100.0000 mg | ORAL_TABLET | Freq: Two times a day (BID) | ORAL | 0 refills | Status: DC

## 2017-07-26 NOTE — Progress Notes (Signed)
Office Visit Note   Patient: Sonya Peterson           Date of Birth: Jul 20, 1956           MRN: [id number] Visit Date: 07/26/2017              Requested by: Unk Pinto, [place], [place] [address] PCP: Unk Pinto, MD  Chief Complaint  Patient presents with  . Right Foot - Follow-up    haglunds excision 06/2014      HPI: Patient is a 61 year old woman who presents in follow-up for initially had lung surgery. She underwent having surgery with bony resection and absorbable anchors were placed in the bone. She subsequent developed infection and these absorbable anchors were removed. Patient has been doing well but she now has developed an infection more proximally over the incision with no pain or involvement distally where her initial surgery was revised. She states that this area of redness and swelling is, but over the past 2 days.  Assessment & Plan: Visit Diagnoses:  1. Postoperative infection, sequela     Plan: We will start her on doxycycline and follow-up on Monday. If she still has redness and pain we will plan for debridement of the Achilles tendon.  Follow-Up Instructions: Return in about 1 week (around 08/02/2017).   Ortho Exam  Patient is alert, oriented, no adenopathy, well-dressed, normal affect, normal respiratory effort. Examination patient has a good dorsalis pedis pulse the distal aspect where she had redness swelling and drainage has completely healed. She now has redness and swelling over the proximal aspect of the incision over the Achilles the Achilles is intact. There is no fluctuation there is no ascending cellulitis she has redness around the surgical incision.  Imaging: No results found. No images are attached to the encounter.  Labs: Lab Results  Component Value Date   HGBA1C 5.6 09/21/2016   HGBA1C 5.9 (H) 07/01/2015   HGBA1C 6.0 (H) 03/28/2015    Orders:  No orders of the defined types were placed in  this encounter.  Meds ordered this encounter  Medications  . doxycycline (VIBRA-TABS) 100 MG tablet    Sig: Take 1 tablet (100 mg total) by mouth 2 (two) times daily.    Dispense:  60 tablet    Refill:  0     Procedures: No procedures performed  Clinical Data: No additional findings.  ROS:  All other systems negative, except as noted in the HPI. Review of Systems  Objective: Vital Signs: Ht 5\' 5"  (1.651 m)   Wt 197 lb (89.4 kg)   BMI 32.78 kg/m   Specialty Comments:  No specialty comments available.  PMFS History: Patient Active Problem List   Diagnosis Date Noted  . Postoperative infection 04/05/2017  . Obesity 10/04/2014  . Prediabetes 11/09/2013  . Hyperlipidemia   . Hypertension   . Allergy   . Vitamin D deficiency   . Depression    Past Medical History:  Diagnosis Date  . Allergy   . Depression   . Hyperlipidemia   . Hypertension   . Obesity (BMI 30.0-34.9)   . Prediabetes 11/09/2013  . Vitamin D deficiency     Family History  Problem Relation Age of Onset  . Cancer Mother 21       breast  . Hypertension Mother   . Diabetes Mother     Past Surgical History:  Procedure Laterality Date  . ACHILLES TENDON SURGERY    .  BACK SURGERY    . DILATION AND CURETTAGE OF UTERUS    . HEEL SPUR SURGERY Left 2015  . [place] SURGERY  2004  . TUBAL LIGATION     Social History   Occupational History  . Not on file.   Social History Main Topics  . Smoking status: Never Smoker  . Smokeless tobacco: Never Used  . Alcohol use No  . Drug use: No  . Sexual activity: Not on file

## 2017-08-02 ENCOUNTER — Ambulatory Visit (INDEPENDENT_AMBULATORY_CARE_PROVIDER_SITE_OTHER): Admitting: Orthopedic Surgery

## 2017-08-02 ENCOUNTER — Encounter (INDEPENDENT_AMBULATORY_CARE_PROVIDER_SITE_OTHER): Payer: Self-pay | Admitting: Orthopedic Surgery

## 2017-08-02 DIAGNOSIS — T8140XS Infection following a procedure, unspecified, sequela: Secondary | ICD-10-CM

## 2017-08-02 DIAGNOSIS — T814XXS Infection following a procedure, sequela: Secondary | ICD-10-CM | POA: Diagnosis not present

## 2017-08-02 NOTE — Progress Notes (Signed)
Office Visit Note   Patient: Sonya Peterson           Date of Birth: Aug 08, 1956           MRN: [id number] Visit Date: 08/02/2017              Requested by: Unk Pinto, [place], [place] [address] PCP: Unk Pinto, MD  Chief Complaint  Patient presents with  . Right Ankle - Follow-up      HPI: Patient is a 61 year old woman who presents status post excision of deep retained hardware from an infection for Achilles reconstruction. Patient states that this resolved well however she now has an infection more proximally she states it feels better on the doxycycline but still has a very small open draining wound.  Assessment & Plan: Visit Diagnoses:  1. Postoperative infection, sequela     Plan: We will plan for further debridement of the Achilles and removal of any deep retained sutures. Patient will continue with the doxycycline plan for overnight observation. Risks and benefits were discussed including persistent infection nonhealing the wound. Patient was given a note that she would need to be out of work for 3 weeks postoperatively.  Follow-Up Instructions: Return in about 2 weeks (around 08/16/2017).   Ortho Exam  Patient is alert, oriented, no adenopathy, well-dressed, normal affect, normal respiratory effort. Examination patient has less swelling less redness and less tenderness to palpation around the Achilles. There is a very small punctate wound with clear drainage. She has a little bit of redness around the incision fullness but no fluctuance. There is less tenderness to palpation.  Imaging: No results found. No images are attached to the encounter.  Labs: Lab Results  Component Value Date   HGBA1C 5.6 09/21/2016   HGBA1C 5.9 (H) 07/01/2015   HGBA1C 6.0 (H) 03/28/2015    Orders:  No orders of the defined types were placed in this encounter.  No orders of the defined types were placed in this encounter.    Procedures: No procedures performed  Clinical Data: No additional findings.  ROS:  All other systems negative, except as noted in the HPI. Review of Systems  Objective: Vital Signs: There were no vitals taken for this visit.  Specialty Comments:  No specialty comments available.  PMFS History: Patient Active Problem List   Diagnosis Date Noted  . Postoperative infection 04/05/2017  . Obesity 10/04/2014  . Prediabetes 11/09/2013  . Hyperlipidemia   . Hypertension   . Allergy   . Vitamin D deficiency   . Depression    Past Medical History:  Diagnosis Date  . Allergy   . Depression   . Hyperlipidemia   . Hypertension   . Obesity (BMI 30.0-34.9)   . Prediabetes 11/09/2013  . Vitamin D deficiency     Family History  Problem Relation Age of Onset  . Cancer Mother 53       breast  . Hypertension Mother   . Diabetes Mother     Past Surgical History:  Procedure Laterality Date  . ACHILLES TENDON SURGERY    . BACK SURGERY    . DILATION AND CURETTAGE OF UTERUS    . HEEL SPUR SURGERY Left 2015  . [place] SURGERY  2004  . TUBAL LIGATION     Social History   Occupational History  . Not on file.   Social History Main Topics  . Smoking status: Never Smoker  . Smokeless tobacco: Never Used  .  Alcohol use No  . Drug use: No  . Sexual activity: Not on file

## 2017-08-04 ENCOUNTER — Other Ambulatory Visit (INDEPENDENT_AMBULATORY_CARE_PROVIDER_SITE_OTHER): Payer: Self-pay | Admitting: Family

## 2017-08-04 ENCOUNTER — Encounter (HOSPITAL_COMMUNITY): Payer: Self-pay | Admitting: *Deleted

## 2017-08-04 DIAGNOSIS — E669 Obesity, unspecified: Secondary | ICD-10-CM | POA: Diagnosis not present

## 2017-08-04 DIAGNOSIS — M65171 Other infective (teno)synovitis, right ankle and foot: Secondary | ICD-10-CM | POA: Diagnosis not present

## 2017-08-04 DIAGNOSIS — E559 Vitamin D deficiency, unspecified: Secondary | ICD-10-CM | POA: Diagnosis not present

## 2017-08-04 DIAGNOSIS — E785 Hyperlipidemia, unspecified: Secondary | ICD-10-CM | POA: Diagnosis not present

## 2017-08-04 DIAGNOSIS — F329 Major depressive disorder, single episode, unspecified: Secondary | ICD-10-CM | POA: Diagnosis not present

## 2017-08-04 DIAGNOSIS — F429 Obsessive-compulsive disorder, unspecified: Secondary | ICD-10-CM | POA: Diagnosis not present

## 2017-08-04 DIAGNOSIS — Z79899 Other long term (current) drug therapy: Secondary | ICD-10-CM | POA: Diagnosis not present

## 2017-08-06 ENCOUNTER — Ambulatory Visit (HOSPITAL_COMMUNITY): Admitting: Anesthesiology

## 2017-08-06 ENCOUNTER — Encounter (HOSPITAL_COMMUNITY)
Admission: RE | Disposition: A | Discharge status: Home / Self Care | Payer: Self-pay | Source: Ambulatory Visit | Attending: Orthopedic Surgery

## 2017-08-06 ENCOUNTER — Ambulatory Visit (HOSPITAL_COMMUNITY)
Admission: RE | Admit: 2017-08-06 | Discharge: 2017-08-06 | Disposition: A | Discharge status: Home / Self Care | Source: Ambulatory Visit | Attending: Orthopedic Surgery | Admitting: Orthopedic Surgery

## 2017-08-06 ENCOUNTER — Encounter (HOSPITAL_COMMUNITY): Payer: Self-pay

## 2017-08-06 DIAGNOSIS — Z79899 Other long term (current) drug therapy: Secondary | ICD-10-CM | POA: Insufficient documentation

## 2017-08-06 DIAGNOSIS — F429 Obsessive-compulsive disorder, unspecified: Secondary | ICD-10-CM | POA: Insufficient documentation

## 2017-08-06 DIAGNOSIS — M65171 Other infective (teno)synovitis, right ankle and foot: Secondary | ICD-10-CM | POA: Diagnosis not present

## 2017-08-06 DIAGNOSIS — M6510 Other infective (teno)synovitis, unspecified site: Secondary | ICD-10-CM

## 2017-08-06 DIAGNOSIS — E559 Vitamin D deficiency, unspecified: Secondary | ICD-10-CM | POA: Insufficient documentation

## 2017-08-06 DIAGNOSIS — F329 Major depressive disorder, single episode, unspecified: Secondary | ICD-10-CM | POA: Insufficient documentation

## 2017-08-06 DIAGNOSIS — E669 Obesity, unspecified: Secondary | ICD-10-CM | POA: Insufficient documentation

## 2017-08-06 DIAGNOSIS — E785 Hyperlipidemia, unspecified: Secondary | ICD-10-CM | POA: Insufficient documentation

## 2017-08-06 HISTORY — PX: I & D EXTREMITY: SHX5045

## 2017-08-06 HISTORY — DX: Bronchitis, not specified as acute or chronic: J40

## 2017-08-06 HISTORY — DX: Unspecified osteoarthritis, unspecified site: M19.90

## 2017-08-06 LAB — CBC
HEMATOCRIT: 41.7 % (ref 36.0–46.0)
Hemoglobin: 13.5 g/dL (ref 12.0–15.0)
MCH: 29.5 pg (ref 26.0–34.0)
MCHC: 32.4 g/dL (ref 30.0–36.0)
MCV: 91 fL (ref 78.0–100.0)
Platelets: 285 10*3/uL (ref 150–400)
RBC: 4.58 MIL/uL (ref 3.87–5.11)
RDW: 13 % (ref 11.5–15.5)
WBC: 8.9 10*3/uL (ref 4.0–10.5)

## 2017-08-06 LAB — BASIC METABOLIC PANEL
Anion gap: 9 (ref 5–15)
BUN: 16 mg/dL (ref 6–20)
CHLORIDE: 106 mmol/L (ref 101–111)
CO2: 25 mmol/L (ref 22–32)
Calcium: 8.8 mg/dL — ABNORMAL LOW (ref 8.9–10.3)
Creatinine, Ser: 0.57 mg/dL (ref 0.44–1.00)
GFR calc Af Amer: >60 mL/min (ref >60)
GFR calc non Af Amer: >60 mL/min (ref >60)
GLUCOSE: 94 mg/dL (ref 65–99)
POTASSIUM: 3.5 mmol/L (ref 3.5–5.1)
Sodium: 140 mmol/L (ref 135–145)

## 2017-08-06 SURGERY — IRRIGATION AND DEBRIDEMENT EXTREMITY
Anesthesia: General | Site: Ankle | Laterality: Right

## 2017-08-06 MED ORDER — CHLORHEXIDINE GLUCONATE 4 % EX LIQD: 60.0000 mL | Freq: Once | CUTANEOUS | Status: DC

## 2017-08-06 MED ORDER — OXYCODONE-ACETAMINOPHEN 5-325 MG PO TABS: 1.0000 | ORAL_TABLET | ORAL | 0 refills | Status: DC | PRN

## 2017-08-06 MED ORDER — FENTANYL CITRATE (PF) 100 MCG/2ML IJ SOLN
100.0000 ug | Freq: Once | INTRAMUSCULAR | Status: AC
  Administered 2017-08-06: 50 ug via INTRAVENOUS
  Administered 2017-08-06: 75 ug via INTRAVENOUS
  Administered 2017-08-06: 25 ug via INTRAVENOUS
  Filled 2017-08-06: qty 2

## 2017-08-06 MED ORDER — HYDROMORPHONE HCL 1 MG/ML IJ SOLN
INTRAMUSCULAR | Status: AC
  Administered 2017-08-06: 0.5 mg via INTRAVENOUS
  Filled 2017-08-06: qty 1

## 2017-08-06 MED ORDER — MIDAZOLAM HCL 2 MG/2ML IJ SOLN
INTRAMUSCULAR | Status: AC
  Filled 2017-08-06: qty 2

## 2017-08-06 MED ORDER — MIDAZOLAM HCL 2 MG/2ML IJ SOLN
2.0000 mg | Freq: Once | INTRAMUSCULAR | Status: AC
  Administered 2017-08-06: 2 mg via INTRAVENOUS
  Filled 2017-08-06: qty 2

## 2017-08-06 MED ORDER — FENTANYL CITRATE (PF) 250 MCG/5ML IJ SOLN
INTRAMUSCULAR | Status: AC
  Filled 2017-08-06: qty 5

## 2017-08-06 MED ORDER — HYDROMORPHONE HCL 1 MG/ML IJ SOLN
0.2500 mg | INTRAMUSCULAR | Status: DC | PRN
  Administered 2017-08-06 (×4): 0.5 mg via INTRAVENOUS

## 2017-08-06 MED ORDER — DEXAMETHASONE SODIUM PHOSPHATE 10 MG/ML IJ SOLN
INTRAMUSCULAR | Status: DC | PRN
  Administered 2017-08-06: 4 mg via INTRAVENOUS

## 2017-08-06 MED ORDER — SODIUM CHLORIDE 0.9 % IR SOLN: Status: DC | PRN

## 2017-08-06 MED ORDER — SUCCINYLCHOLINE CHLORIDE 200 MG/10ML IV SOSY
PREFILLED_SYRINGE | INTRAVENOUS | Status: AC
  Filled 2017-08-06: qty 10

## 2017-08-06 MED ORDER — CEFAZOLIN SODIUM-DEXTROSE 2-4 GM/100ML-% IV SOLN
2.0000 g | INTRAVENOUS | Status: AC
  Administered 2017-08-06: 2 g via INTRAVENOUS
  Filled 2017-08-06: qty 100

## 2017-08-06 MED ORDER — HYDROMORPHONE HCL 1 MG/ML IJ SOLN
INTRAMUSCULAR | Status: AC
  Filled 2017-08-06: qty 1

## 2017-08-06 MED ORDER — PHENYLEPHRINE 40 MCG/ML (10ML) SYRINGE FOR IV PUSH (FOR BLOOD PRESSURE SUPPORT)
PREFILLED_SYRINGE | INTRAVENOUS | Status: AC
  Filled 2017-08-06: qty 10

## 2017-08-06 MED ORDER — 0.9 % SODIUM CHLORIDE (POUR BTL) OPTIME
TOPICAL | Status: DC | PRN
  Administered 2017-08-06: 1000 mL

## 2017-08-06 MED ORDER — LIDOCAINE HCL (CARDIAC) 20 MG/ML IV SOLN
INTRAVENOUS | Status: DC | PRN
  Administered 2017-08-06: 100 mg via INTRAVENOUS

## 2017-08-06 MED ORDER — OXYCODONE-ACETAMINOPHEN 5-325 MG PO TABS
ORAL_TABLET | ORAL | Status: AC
  Administered 2017-08-06: 2
  Filled 2017-08-06: qty 2

## 2017-08-06 MED ORDER — ONDANSETRON HCL 4 MG/2ML IJ SOLN
INTRAMUSCULAR | Status: DC | PRN
  Administered 2017-08-06: 4 mg via INTRAVENOUS

## 2017-08-06 MED ORDER — PROPOFOL 10 MG/ML IV BOLUS
INTRAVENOUS | Status: DC | PRN
  Administered 2017-08-06: 150 mg via INTRAVENOUS

## 2017-08-06 MED ORDER — LACTATED RINGERS IV SOLN
INTRAVENOUS | Status: DC
  Administered 2017-08-06: 07:00:00 via INTRAVENOUS

## 2017-08-06 MED ORDER — PROPOFOL 10 MG/ML IV BOLUS
INTRAVENOUS | Status: AC
  Filled 2017-08-06: qty 20

## 2017-08-06 MED ORDER — LIDOCAINE 2% (20 MG/ML) 5 ML SYRINGE
INTRAMUSCULAR | Status: AC
  Filled 2017-08-06: qty 5

## 2017-08-06 MED ORDER — PROMETHAZINE HCL 25 MG/ML IJ SOLN: 6.2500 mg | INTRAMUSCULAR | Status: DC | PRN

## 2017-08-06 MED ORDER — ROCURONIUM BROMIDE 10 MG/ML (PF) SYRINGE
PREFILLED_SYRINGE | INTRAVENOUS | Status: AC
  Filled 2017-08-06: qty 5

## 2017-08-06 SURGICAL SUPPLY — 37 items
BLADE SURG 21 STRL SS (BLADE) ×1 IMPLANT
BNDG COHESIVE 6X5 TAN STRL LF (GAUZE/BANDAGES/DRESSINGS) ×2 IMPLANT
BNDG GAUZE ELAST 4 BULKY (GAUZE/BANDAGES/DRESSINGS) ×4 IMPLANT
COVER SURGICAL LIGHT HANDLE (MISCELLANEOUS) ×4 IMPLANT
DRAPE U-SHAPE 47X51 STRL (DRAPES) ×3 IMPLANT
DRSG ADAPTIC 3X8 NADH LF (GAUZE/BANDAGES/DRESSINGS) ×3 IMPLANT
DRSG PAD ABDOMINAL 8X10 ST (GAUZE/BANDAGES/DRESSINGS) ×2 IMPLANT
DURAPREP 26ML APPLICATOR (WOUND CARE) ×3 IMPLANT
ELECT REM PT RETURN 9FT ADLT (ELECTROSURGICAL)
ELECTRODE REM PT RTRN 9FT ADLT (ELECTROSURGICAL) IMPLANT
GAUZE SPONGE 4X4 12PLY STRL (GAUZE/BANDAGES/DRESSINGS) ×3 IMPLANT
GLOVE BIOGEL PI IND STRL 6.5 (GLOVE) IMPLANT
GLOVE BIOGEL PI IND STRL 9 (GLOVE) ×1 IMPLANT
GLOVE BIOGEL PI INDICATOR 6.5 (GLOVE) ×2
GLOVE BIOGEL PI INDICATOR 9 (GLOVE) ×2
GLOVE ECLIPSE 6.0 STRL STRAW (GLOVE) ×2 IMPLANT
GLOVE SURG ORTHO 9.0 STRL STRW (GLOVE) ×3 IMPLANT
GOWN STRL REUS W/ TWL LRG LVL3 (GOWN DISPOSABLE) IMPLANT
GOWN STRL REUS W/ TWL XL LVL3 (GOWN DISPOSABLE) ×2 IMPLANT
GOWN STRL REUS W/TWL LRG LVL3 (GOWN DISPOSABLE) ×3
GOWN STRL REUS W/TWL XL LVL3 (GOWN DISPOSABLE) ×3
HANDPIECE INTERPULSE COAX TIP (DISPOSABLE)
KIT BASIN OR (CUSTOM PROCEDURE TRAY) ×3 IMPLANT
KIT ROOM TURNOVER OR (KITS) ×3 IMPLANT
MANIFOLD NEPTUNE II (INSTRUMENTS) ×3 IMPLANT
NS IRRIG 1000ML POUR BTL (IV SOLUTION) ×3 IMPLANT
PACK ORTHO EXTREMITY (CUSTOM PROCEDURE TRAY) ×3 IMPLANT
PAD ARMBOARD 7.5X6 YLW CONV (MISCELLANEOUS) ×4 IMPLANT
SET HNDPC FAN SPRY TIP SCT (DISPOSABLE) IMPLANT
STOCKINETTE IMPERVIOUS 9X36 MD (GAUZE/BANDAGES/DRESSINGS) IMPLANT
SUT ETHILON 2 0 PSLX (SUTURE) ×4 IMPLANT
SWAB COLLECTION DEVICE MRSA (MISCELLANEOUS) ×1 IMPLANT
SWAB CULTURE ESWAB REG 1ML (MISCELLANEOUS) IMPLANT
TOWEL OR 17X26 10 PK STRL BLUE (TOWEL DISPOSABLE) ×3 IMPLANT
TUBE CONNECTING 12'X1/4 (SUCTIONS) ×1
TUBE CONNECTING 12X1/4 (SUCTIONS) ×2 IMPLANT
YANKAUER SUCT BULB TIP NO VENT (SUCTIONS) ×3 IMPLANT

## 2017-08-06 NOTE — Anesthesia Procedure Notes (Signed)
Procedure Name: LMA Insertion Date/Time: 08/06/2017 8:33 AM Performed by: Lavell Luster Pre-anesthesia Checklist: Patient identified, Emergency Drugs available, Suction available, Patient being monitored and Timeout performed Patient Re-evaluated:Patient Re-evaluated prior to induction Oxygen Delivery Method: Circle system utilized Preoxygenation: Pre-oxygenation with 100% oxygen Induction Type: IV induction Ventilation: Mask ventilation without difficulty LMA: LMA inserted LMA Size: 4.0 Laser Tube: Cuffed inflated with minimal occlusive pressure - saline Number of attempts: 1 Placement Confirmation: breath sounds checked- equal and bilateral and positive ETCO2 Tube secured with: Tape Dental Injury: Teeth and Oropharynx as per pre-operative assessment

## 2017-08-06 NOTE — H&P (Signed)
Sonya Peterson is an 61 y.o. female.   Chief Complaint: recurrent Achilles ulcer right ankle HPI: patient is a 61 year old woman who is status post Achilles reconstruction she developed infection from the retained bone anchors ultimately she did well and now she has recurrent infection more proximally.  Past Medical History:  Diagnosis Date  . Allergy   . Arthritis   . Bronchitis   . Depression    OCD  . Hyperlipidemia   . Obesity (BMI 30.0-34.9)   . Prediabetes 11/09/2013  . Vitamin D deficiency     Past Surgical History:  Procedure Laterality Date  . ACHILLES TENDON SURGERY    . BACK SURGERY    . DILATION AND CURETTAGE OF UTERUS    . HEEL SPUR SURGERY Left 2015  . [place] SURGERY  2004  . TUBAL LIGATION      Family History  Problem Relation Age of Onset  . Cancer Mother 79       breast  . Hypertension Mother   . Diabetes Mother    Social History:  reports that she has never smoked. She has never used smokeless tobacco. She reports that she does not drink alcohol or use drugs.  Allergies: No Known Allergies  Medications Prior to Admission  Medication Sig Dispense Refill  . Cholecalciferol (VITAMIN D3) 5000 units TABS Take 5,000 Units by mouth at bedtime.    Marland Kitchen doxycycline (VIBRA-TABS) 100 MG tablet Take 1 tablet (100 mg total) by mouth 2 (two) times daily. 60 tablet 0  . FLUoxetine (PROZAC) 20 MG capsule TAKE 3 CAPSULES (60 MG) DAILY FOR MOOD (Patient taking differently: Take 20 mg by mouth at bedtime. ) 270 capsule 1  . ibuprofen (ADVIL,MOTRIN) 200 MG tablet Take 600 mg by mouth every 8 (eight) hours as needed (for pain.).    Marland Kitchen loratadine (CLARITIN) 10 MG tablet TAKE 1 TABLET DAILY FOR ALLERGIES (Patient taking differently: Take 10 mg by mouth at bedtime. ) 90 tablet 1  . meloxicam (MOBIC) 7.5 MG tablet Take 1 tablet (7.5 mg total) by mouth daily. (Patient taking differently: Take 7.5 mg by mouth See admin instructions. Take 1 tablet (7.5 mg) by mouth 5 days a week  on Wednesdays through Sunday.) 90 tablet 1  . montelukast (SINGULAIR) 10 MG tablet Take 1 tablet (10 mg total) by mouth daily. (Patient taking differently: Take 10 mg by mouth at bedtime. ) 90 tablet 2  . simvastatin (ZOCOR) 40 MG tablet TAKE 1 TABLET AT BEDTIME (Patient taking differently: Take 40 mg by mouth at bedtime. ) 90 tablet 1  . oxyCODONE-acetaminophen (PERCOCET/ROXICET) 5-325 MG tablet Take 1 tablet by mouth every 6 (six) hours as needed for severe pain. (Patient not taking: Reported on 08/03/2017) 30 tablet 0  . predniSONE (DELTASONE) 20 MG tablet 2 tablets daily for 3 days, 1 tablet daily for 4 days. (Patient not taking: Reported on 08/03/2017) 10 tablet 0    Results for orders placed or performed during the hospital encounter of 08/06/17 (from the past 48 hour(s))  Basic metabolic panel     Status: Abnormal   Collection Time: 08/06/17  6:32 AM  Result Value Ref Range   Sodium 140 135 - 145 mmol/L   Potassium 3.5 3.5 - 5.1 mmol/L   Chloride 106 101 - 111 mmol/L   CO2 25 22 - 32 mmol/L   Glucose, Bld 94 65 - 99 mg/dL   BUN 16 6 - 20 mg/dL   Creatinine, Ser 0.57 0.44 - 1.00  mg/dL   Calcium 8.8 (L) 8.9 - 10.3 mg/dL   GFR calc non Af Amer >60 >60 mL/min   GFR calc Af Amer >60 >60 mL/min    Comment: (NOTE) The eGFR has been calculated using the CKD EPI equation. This calculation has not been validated in all clinical situations. eGFR's persistently <60 mL/min signify possible Chronic Kidney Disease.    Anion gap 9 5 - 15  CBC     Status: None   Collection Time: 08/06/17  6:32 AM  Result Value Ref Range   WBC 8.9 4.0 - 10.5 K/uL   RBC 4.58 3.87 - 5.11 MIL/uL   Hemoglobin 13.5 12.0 - 15.0 g/dL   HCT 41.7 36.0 - 46.0 %   MCV 91.0 78.0 - 100.0 fL   MCH 29.5 26.0 - 34.0 pg   MCHC 32.4 30.0 - 36.0 g/dL   RDW 13.0 11.5 - 15.5 %   Platelets 285 150 - 400 K/uL   No results found.  Review of Systems  All other systems reviewed and are negative.   Blood pressure (!) 152/74,  pulse 70, temperature 98.1 F (36.7 C), temperature source Oral, resp. rate 16, height 5' 5"  (1.651 m), weight 197 lb (89.4 kg), SpO2 95 %. Physical Exam  Examination patient has good pulses she has an draining ulcer more proximal to her previous incision. The previous surgical site is well-healed and nontender to palpation no swelling. Assessment/Plan Assessment: Infection right Achilles status post debridement and status post reconstruction.  Plan: We will plan for repeat debridement of the Achilles tendon. Risks and benefits were discussed including persistent infection. Patient states she understands wish to proceed at this time.  Newt Minion, MD 08/06/2017, 7:12 AM

## 2017-08-06 NOTE — Op Note (Signed)
08/06/2017  9:15 AM  PATIENT:  Sonya Peterson    PRE-OPERATIVE DIAGNOSIS:  INFECTED RIGHT ACHILLES TENDON  POST-OPERATIVE DIAGNOSIS:  Same  PROCEDURE:  IRRIGATION AND DEBRIDEMENT RIGHT ACHILLES  SURGEON:  Newt Minion, MD  PHYSICIAN ASSISTANT:None ANESTHESIA:   General  PREOPERATIVE INDICATIONS:  [name] is a  61 y.o. female with a diagnosis of INFECTED RIGHT ACHILLES TENDON who failed conservative measures and elected for surgical management.    The risks benefits and alternatives were discussed with the patient preoperatively including but not limited to the risks of infection, bleeding, nerve injury, cardiopulmonary complications, the need for revision surgery, among others, and the patient was willing to proceed.  OPERATIVE IMPLANTS: None  OPERATIVE FINDINGS: Cystic abscess around retained nonabsorbable suture  OPERATIVE PROCEDURE: Patient brought the operating room and underwent a general anesthetic. After adequate levels anesthesia were obtained patient was placed in supine position the left hip was bumped up in the right lower extremity was prepped using DuraPrep draped into a sterile field a timeout was called. A longitudinal elliptical incision was made around the 2 ulcerative areas on the incision the skin and ulcers were resected in 1 block of tissue. There was cystic abscess along the Achilles tendon this was excised with a 10 blade knife there was retained suture which was also removed. The remainder of the Achilles tendon was intact no degenerative changes no signs of abscess. The wound was irrigated with normal saline incision was closed using 2-0 nylon with a modified Algower Donati suture technique. A sterile compressive dressing was applied patient was extubated taken to PACU in stable

## 2017-08-06 NOTE — Progress Notes (Signed)
Orthopedic Tech Progress Note Patient Details:  Sonya Peterson [birth year] [id number]  Ortho Devices Type of Ortho Device: CAM walker Ortho Device/Splint Interventions: Application   [place] Pink 08/06/2017, 9:48 AM

## 2017-08-06 NOTE — Anesthesia Preprocedure Evaluation (Addendum)
Anesthesia Evaluation  Patient identified by MRN, date of birth, ID band Patient awake    Reviewed: Allergy & Precautions, NPO status , Patient's Chart, lab work & pertinent test results  Airway Mallampati: II  TM Distance: >3 FB Neck ROM: Full    Dental no notable dental hx.    Pulmonary neg pulmonary ROS,    breath sounds clear to auscultation       Cardiovascular hypertension,  Rhythm:Regular Rate:Normal     Neuro/Psych negative neurological ROS     GI/Hepatic negative GI ROS, Neg liver ROS,   Endo/Other  diabetesMorbid obesity  Renal/GU negative Renal ROS     Musculoskeletal   Abdominal   Peds  Hematology   Anesthesia Other Findings   Reproductive/Obstetrics                            Anesthesia Physical Anesthesia Plan  ASA: III  Anesthesia Plan: General   Post-op Pain Management:    Induction: Intravenous  PONV Risk Score and Plan: 4 or greater and Ondansetron, Dexamethasone, Midazolam, Scopolamine patch - Pre-op, Propofol infusion and Treatment may vary due to age or medical condition  Airway Management Planned: LMA  Additional Equipment:   Intra-op Plan:   Post-operative Plan: Extubation in OR  Informed Consent: I have reviewed the patients History and Physical, chart, labs and discussed the procedure including the risks, benefits and alternatives for the proposed anesthesia with the patient or authorized representative who has indicated his/her understanding and acceptance.     Plan Discussed with: CRNA  Anesthesia Plan Comments:         Anesthesia Quick Evaluation

## 2017-08-06 NOTE — Transfer of Care (Signed)
Immediate Anesthesia Transfer of Care Note  Patient: Sonya Peterson  Procedure(s) Performed: Procedure(s): IRRIGATION AND DEBRIDEMENT RIGHT ACHILLES (Right)  Patient Location: PACU  Anesthesia Type:General  Level of Consciousness: awake, alert  and oriented  Airway & Oxygen Therapy: Patient connected to face mask oxygen  Post-op Assessment: Post -op Vital signs reviewed and stable  Post vital signs: stable  Last Vitals:  Vitals:   08/06/17 0914 08/06/17 0915  BP:  (!) 163/96  Pulse:  (!) 101  Resp:  13  Temp: 36.5 C   SpO2:  100%    Last Pain:  Vitals:   08/06/17 0618  TempSrc: Oral      Patients Stated Pain Goal: 6 (73/40/37 0964)  Complications: No apparent anesthesia complications

## 2017-08-06 NOTE — Anesthesia Postprocedure Evaluation (Signed)
Anesthesia Post Note  Patient: Sonya Peterson  Procedure(s) Performed: Procedure(s) (LRB): IRRIGATION AND DEBRIDEMENT RIGHT ACHILLES (Right)     Patient location during evaluation: PACU Anesthesia Type: General Level of consciousness: awake and alert Pain management: pain level controlled Vital Signs Assessment: post-procedure vital signs reviewed and stable Respiratory status: spontaneous breathing, nonlabored ventilation, respiratory function stable and patient connected to nasal cannula oxygen Cardiovascular status: blood pressure returned to baseline and stable Postop Assessment: no signs of nausea or vomiting Anesthetic complications: no    Last Vitals:  Vitals:   08/06/17 1046 08/06/17 1115  BP: (!) 144/86   Pulse: 72 72  Resp:    Temp:    SpO2: 95% 99%    Last Pain:  Vitals:   08/06/17 1006  TempSrc:   PainSc: 5                  Hymie Gorr,JAMES TERRILL

## 2017-08-07 ENCOUNTER — Encounter (HOSPITAL_COMMUNITY): Payer: Self-pay | Admitting: Orthopedic Surgery

## 2017-08-13 ENCOUNTER — Ambulatory Visit (INDEPENDENT_AMBULATORY_CARE_PROVIDER_SITE_OTHER): Admitting: Family

## 2017-08-13 ENCOUNTER — Encounter (INDEPENDENT_AMBULATORY_CARE_PROVIDER_SITE_OTHER): Payer: Self-pay | Admitting: Family

## 2017-08-13 DIAGNOSIS — M6510 Other infective (teno)synovitis, unspecified site: Secondary | ICD-10-CM

## 2017-08-13 NOTE — Progress Notes (Signed)
   Post-Op Visit Note   Patient: Sonya Peterson           Date of Birth: 20-Feb-1956           MRN: [id number] Visit Date: 08/13/2017 PCP: Unk Pinto, MD  Chief Complaint:  Chief Complaint  Patient presents with  . Right Ankle - Pain    HPI:  The patient is a 61 year old woman seen today one week status post irrigation and debridement of right Achilles infection. She has some soreness with walking. His been full weightbearing in sandals. Is taking doxycycline.    Ortho Exam On examination the incision is well approximated there is no erythema drainage no sign of infection.  Visit Diagnoses:  1. Infection of Achilles tendon     Plan: Begin daily Dial soap cleansing. Apply dry dressing. Follow up in office in 2 weeks for suture removal.  Follow-Up Instructions: Return in about 2 weeks (around 08/27/2017).   Imaging: No results found.  Orders:  No orders of the defined types were placed in this encounter.  No orders of the defined types were placed in this encounter.    PMFS History: Patient Active Problem List   Diagnosis Date Noted  . Infection of Achilles tendon   . Postoperative infection 04/05/2017  . Obesity 10/04/2014  . Prediabetes 11/09/2013  . Hyperlipidemia   . Hypertension   . Allergy   . Vitamin D deficiency   . Depression    Past Medical History:  Diagnosis Date  . Allergy   . Arthritis   . Bronchitis   . Depression    OCD  . Hyperlipidemia   . Obesity (BMI 30.0-34.9)   . Prediabetes 11/09/2013  . Vitamin D deficiency     Family History  Problem Relation Age of Onset  . Cancer Mother 17       breast  . Hypertension Mother   . Diabetes Mother     Past Surgical History:  Procedure Laterality Date  . ACHILLES TENDON SURGERY    . BACK SURGERY    . DILATION AND CURETTAGE OF UTERUS    . HEEL SPUR SURGERY Left 2015  . I&D EXTREMITY Right 08/06/2017   Procedure: IRRIGATION AND DEBRIDEMENT RIGHT ACHILLES;  Surgeon: Newt Minion,  MD;  Location: [facility];  Service: Orthopedics;  Laterality: Right;  . [place] SURGERY  2004  . TUBAL LIGATION     Social History   Occupational History  . Not on file.   Social History Main Topics  . Smoking status: Never Smoker  . Smokeless tobacco: Never Used  . Alcohol use No  . Drug use: No  . Sexual activity: Not on file

## 2017-08-16 ENCOUNTER — Ambulatory Visit (INDEPENDENT_AMBULATORY_CARE_PROVIDER_SITE_OTHER): Admitting: *Deleted

## 2017-08-16 DIAGNOSIS — Z23 Encounter for immunization: Secondary | ICD-10-CM

## 2017-08-30 ENCOUNTER — Ambulatory Visit (INDEPENDENT_AMBULATORY_CARE_PROVIDER_SITE_OTHER): Admitting: Family

## 2017-08-30 DIAGNOSIS — T814XXS Infection following a procedure, sequela: Secondary | ICD-10-CM

## 2017-08-30 DIAGNOSIS — M6510 Other infective (teno)synovitis, unspecified site: Secondary | ICD-10-CM

## 2017-08-30 DIAGNOSIS — T8140XS Infection following a procedure, unspecified, sequela: Secondary | ICD-10-CM

## 2017-08-30 NOTE — Progress Notes (Signed)
   Post-Op Visit Note   Patient: Sonya Peterson           Date of Birth: 1956-08-08           MRN: [id number] Visit Date: 08/30/2017 PCP: Unk Pinto, MD  Chief Complaint:  Chief Complaint  Patient presents with  . Right Ankle - Routine Post Op    I and d right achilles 08/06/17    HPI:  The patient is a 61 year old woman seen today status post irrigation and debridement of right Achilles infection on 08/06/17. She has no pain with ambulation, feels ready to return to work. In sandals full weight bearing.    Ortho Exam On examination the incision is well healed. there is no erythema, drainage. no sign of infection. Dorsiflexion to 80.  Visit Diagnoses:  1. Postoperative infection, sequela   2. Infection of Achilles tendon     Plan: sutures harvested today without incident. advance activities as tolerated. Continue working on scar massage and heel cord stretching. Follow up in office as needed.  Follow-Up Instructions: Return if symptoms worsen or fail to improve.   Imaging: No results found.  Orders:  No orders of the defined types were placed in this encounter.  No orders of the defined types were placed in this encounter.    PMFS History: Patient Active Problem List   Diagnosis Date Noted  . Infection of Achilles tendon   . Postoperative infection 04/05/2017  . Obesity 10/04/2014  . Prediabetes 11/09/2013  . Hyperlipidemia   . Hypertension   . Allergy   . Vitamin D deficiency   . Depression    Past Medical History:  Diagnosis Date  . Allergy   . Arthritis   . Bronchitis   . Depression    OCD  . Hyperlipidemia   . Obesity (BMI 30.0-34.9)   . Prediabetes 11/09/2013  . Vitamin D deficiency     Family History  Problem Relation Age of Onset  . Cancer Mother 51       breast  . Hypertension Mother   . Diabetes Mother     Past Surgical History:  Procedure Laterality Date  . ACHILLES TENDON SURGERY    . BACK SURGERY    . DILATION AND CURETTAGE  OF UTERUS    . HEEL SPUR SURGERY Left 2015  . I&D EXTREMITY Right 08/06/2017   Procedure: IRRIGATION AND DEBRIDEMENT RIGHT ACHILLES;  Surgeon: Newt Minion, MD;  Location: [facility];  Service: Orthopedics;  Laterality: Right;  . [place] SURGERY  2004  . TUBAL LIGATION     Social History   Occupational History  . Not on file.   Social History Main Topics  . Smoking status: Never Smoker  . Smokeless tobacco: Never Used  . Alcohol use No  . Drug use: No  . Sexual activity: Not on file

## 2017-10-20 ENCOUNTER — Encounter: Payer: Self-pay | Admitting: Physician Assistant

## 2017-10-20 ENCOUNTER — Ambulatory Visit (INDEPENDENT_AMBULATORY_CARE_PROVIDER_SITE_OTHER): Admitting: Physician Assistant

## 2017-10-20 VITALS — BP 140/82 | HR 101 | Temp 97.4°F | Resp 18 | Ht 65.0 in | Wt 196.4 lb

## 2017-10-20 DIAGNOSIS — E6609 Other obesity due to excess calories: Secondary | ICD-10-CM

## 2017-10-20 DIAGNOSIS — R7303 Prediabetes: Secondary | ICD-10-CM

## 2017-10-20 DIAGNOSIS — Z6832 Body mass index (BMI) 32.0-32.9, adult: Secondary | ICD-10-CM

## 2017-10-20 DIAGNOSIS — Z79899 Other long term (current) drug therapy: Secondary | ICD-10-CM

## 2017-10-20 DIAGNOSIS — I10 Essential (primary) hypertension: Secondary | ICD-10-CM

## 2017-10-20 DIAGNOSIS — T7840XA Allergy, unspecified, initial encounter: Secondary | ICD-10-CM

## 2017-10-20 DIAGNOSIS — Z136 Encounter for screening for cardiovascular disorders: Secondary | ICD-10-CM

## 2017-10-20 DIAGNOSIS — E559 Vitamin D deficiency, unspecified: Secondary | ICD-10-CM

## 2017-10-20 DIAGNOSIS — E785 Hyperlipidemia, unspecified: Secondary | ICD-10-CM

## 2017-10-20 DIAGNOSIS — Z0000 Encounter for general adult medical examination without abnormal findings: Secondary | ICD-10-CM

## 2017-10-20 DIAGNOSIS — Z1159 Encounter for screening for other viral diseases: Secondary | ICD-10-CM | POA: Diagnosis not present

## 2017-10-20 DIAGNOSIS — F3341 Major depressive disorder, recurrent, in partial remission: Secondary | ICD-10-CM

## 2017-10-20 MED ORDER — TRIAMCINOLONE ACETONIDE 55 MCG/ACT NA AERO: 1.0000 | INHALATION_SPRAY | Freq: Two times a day (BID) | NASAL | 0 refills | Status: DC

## 2017-10-20 NOTE — Patient Instructions (Addendum)
Can do a steroid nasal spary 1-2 sparys at night each nostril. Remember to spray each nostril twice towards the outer part of your eye.  Do not sniff but instead pinch your nose and tilt your head back to help the medicine get into your sinuses.  The best time to do this is at bedtime. Stop if you get blurred vision or nose bleeds.   [facility]  7 a.m.-6:30 p.m., Monday 7 a.m.-5 p.m., Tuesday-Friday Schedule an appointment by calling [phone].  Encourage you to get the 3D Mammogram  The 3D Mammogram is much more specific and sensitive to pick up breast cancer. For women with fibrocystic breast or lumpy breast it can be hard to determine if it is cancer or not but the 3D mammogram is able to tell this difference which cuts back on unneeded additional tests or scary call backs.   - over 40% increase in detection of breast cancer - over 40% reduction in false positives.  - fewer call backs - reduced anxiety - improved outcomes - [facility]   Call Dr. Earlean Shawl to See when you are due for colonoscopy, you are either over due or due 2021 Phone: [phone];  Cologuard is an easy to use noninvasive colon cancer screening test based on the latest advances in stool DNA science.   Colon cancer is 3rd most diagnosed cancer and 2nd leading cause of death in both men and women 83 years of age and older despite being one of the most preventable and treatable cancers if found early.  4 of out 5 people diagnosed with colon cancer have NO prior family history.  When caught EARLY 90% of colon cancer is curable.   You have agreed to do a Cologuard screening and have declined a colonoscopy in spite of being explained the risks and benefits of the colonoscopy in detail, including cancer and death. Please understand that this is test not as sensitive or specific as a colonoscopy and you are still recommended to get a colonoscopy.   If you are NOT medicare please call your  insurance company and give them these items to see if they will cover it: 1) CPT code, [phone] 2) Provider is Probation officer 3) [employer] NPI [phone] 4) [place] Tax ID [phone]  Out-of-pocket cost for Cologuard can range from $0 - $649 so please call  You will receive a short call from [place] support center at [employer], when you receive a call they will say they are from [place],  to confirm your mailing address and give you more information.  When they calll you, it will appear on the caller ID as "[employer]" or in some cases only this number will appear, [phone].   [employer] will ship your collection kit directly to you. You will collect a single stool sample in the privacy of your own home, no special preparation required. You will return the kit via [facility] pre-paid shipping or pick-up, in the same box it arrived in. Then I will contact you to discuss your results after I receive them from the laboratory.   If you have any questions or concerns, C[employer] Customer Support Specialist are available 24 hours a day, 7 days a week at [phone] or go to [email or web address].

## 2017-10-20 NOTE — Progress Notes (Signed)
Complete Physical  Assessment and Plan:  Essential hypertension - continue medications, DASH diet, exercise and monitor at home. Call if greater than 130/80.  - CBC with Differential/Platelet - BASIC METABOLIC PANEL WITH GFR - Hepatic function panel - TSH - Urinalysis, Routine w reflex microscopic - Microalbumin / creatinine urine ratio - EKG 12-Lead  Prediabetes Discussed general issues about diabetes pathophysiology and management., Educational material distributed., Suggested low cholesterol diet., Encouraged aerobic exercise., Discussed foot care., Reminded to get yearly retinal exam - Hemoglobin A1c   Hyperlipidemia -continue medications, check lipids, decrease fatty foods, increase activity.  - Lipid panel  Encounter for general adult medical examination with abnormal findings  Recurrent major depressive disorder, in partial remission (HCC) Remission, continue prozac  Vitamin D deficiency -     VITAMIN D 25 Hydroxy (Vit-D Deficiency, Fractures)  Allergic state, initial encounter -     triamcinolone (NASACORT) 55 MCG/ACT AERO nasal inhaler; Place 1 spray 2 (two) times daily into the nose.  BMI 32.0-32.9,adult - follow up 3 months for progress monitoring - increase veggies, decrease carbs - long discussion about weight loss, diet, and exercise  Class 1 obesity due to excess calories with serious comorbidity and body mass index (BMI) of 32.0 to 32.9 in adult - follow up 3 months for progress monitoring - increase veggies, decrease carbs - long discussion about weight loss, diet, and exercise  Medication management -     Magnesium  Screening for viral disease -     HIV antibody -     Hepatitis C antibody   Discussed med's effects and SE's. Screening labs and tests as requested with regular follow-up as recommended. Over 40 minutes of exam, counseling, chart review, and critical decision making was performed this visit.  Future Appointments  Date Time Provider  [place]  10/20/2018  2:00 PM Vicie Mutters, PA-C [facility] None    HPI  61 y.o. female  presents for a complete physical.  Had right achilles tendon debridement 08/31 with Dr. Sharol Given, states it is doing better.   Just today started with sinus drainage, non productive cough.   Will have bilateral watery eyes, mainly left, x months.   Her blood pressure has been controlled at home, today their BP is BP: 140/82 She does not workout. She denies chest pain, shortness of breath, dizziness.  She is on cholesterol medication, zocor 40mg  and denies myalgias. Her cholesterol is at goal. The cholesterol last visit was:   Lab Results  Component Value Date   CHOL 120 03/16/2017   HDL 33 (L) 03/16/2017   LDLCALC 60 03/16/2017   TRIG 135 03/16/2017   CHOLHDL 3.6 03/16/2017   She has been working on diet and exercise for prediabetes, and denies paresthesia of the feet, polydipsia, polyuria and visual disturbances. Last A1C in the office was:  Lab Results  Component Value Date   HGBA1C 5.6 09/21/2016   Patient is on Vitamin D supplement.   Lab Results  Component Value Date   VD25OH 43 07/01/2015   She is on prozac 20mg , 3 tabs a day for depression which helps.  BMI is Body mass index is 32.68 kg/m., she is working on diet and exercise. Wt Readings from Last 3 Encounters:  10/20/17 196 lb 6.4 oz (89.1 kg)  08/06/17 197 lb (89.4 kg)  07/26/17 197 lb (89.4 kg)     Current Medications:  Current Outpatient Medications on File Prior to Visit  Medication Sig Dispense Refill  . Cholecalciferol (VITAMIN D3) 5000  units TABS Take 5,000 Units by mouth at bedtime.    Marland Kitchen FLUoxetine (PROZAC) 20 MG capsule TAKE 3 CAPSULES (60 MG) DAILY FOR MOOD (Patient taking differently: Take 20 mg by mouth at bedtime. ) 270 capsule 1  . ibuprofen (ADVIL,MOTRIN) 200 MG tablet Take 600 mg by mouth every 8 (eight) hours as needed (for pain.).    Marland Kitchen loratadine (CLARITIN) 10 MG tablet TAKE 1 TABLET DAILY FOR  ALLERGIES (Patient taking differently: Take 10 mg by mouth at bedtime. ) 90 tablet 1  . meloxicam (MOBIC) 7.5 MG tablet Take 1 tablet (7.5 mg total) by mouth daily. (Patient taking differently: Take 7.5 mg by mouth See admin instructions. Take 1 tablet (7.5 mg) by mouth 5 days a week on Wednesdays through Sunday.) 90 tablet 1  . montelukast (SINGULAIR) 10 MG tablet Take 1 tablet (10 mg total) by mouth daily. (Patient taking differently: Take 10 mg by mouth at bedtime. ) 90 tablet 2  . simvastatin (ZOCOR) 40 MG tablet TAKE 1 TABLET AT BEDTIME (Patient taking differently: Take 40 mg by mouth at bedtime. ) 90 tablet 1   No current facility-administered medications on file prior to visit.    Health Maintenance:   Immunization History  Administered Date(s) Administered  . Influenza Inj Mdck Quad With Preservative 08/16/2017  . Influenza Split 08/31/2013, 10/04/2014, 10/09/2015  . Influenza, Seasonal, Injecte, Preservative Fre 09/21/2016  . Tdap 03/28/2015   Tetanus: 2016 Pneumovax: 1999 Flu vaccine: 2018 Zostavax: N/A  Pap: 03/02/2013 neg will get every 5 years, due age 56 and 61, never abnormal pap Diagnostic MGM: 06/2016 DUE DEXA: N/A Colonoscopy: 2011, Dr. Earlean Shawl due 2016 EGD:N/A LMP 2008 DEE- unknown  Patient Care Team: Unk Pinto, MD as PCP - General (Internal Medicine) Newt Minion, MD as Consulting Physician (Orthopedic Surgery) Richmond Campbell, MD as Consulting Physician (Gastroenterology)  Medical History:  Past Medical History:  Diagnosis Date  . Allergy   . Arthritis   . Bronchitis   . Depression    OCD  . Hyperlipidemia   . Obesity (BMI 30.0-34.9)   . Prediabetes 11/09/2013  . Vitamin D deficiency    Allergies No Known Allergies  SURGICAL HISTORY She  has a past surgical history that includes Lumbar disc surgery (2004); Tubal ligation; Dilation and curettage of uterus; Heel spur surgery (Left, 2015); Back surgery; Achilles tendon surgery; and IRRIGATION  AND DEBRIDEMENT RIGHT ACHILLES (Right, 08/06/2017). FAMILY HISTORY Her family history includes Cancer (age of onset: 36) in her mother; Diabetes in her mother; Hypertension in her mother. SOCIAL HISTORY She  reports that  has never smoked. she has never used smokeless tobacco. She reports that she does not drink alcohol or use drugs.   Review of Systems: Review of Systems  Constitutional: Negative.  Negative for chills and fever.  HENT: Positive for congestion and sinus pain. Negative for ear discharge, ear pain, hearing loss, nosebleeds, sore throat and tinnitus.   Eyes: Negative.   Respiratory: Negative.  Negative for stridor.   Cardiovascular: Negative.   Gastrointestinal: Negative.   Genitourinary: Negative.   Musculoskeletal: Negative.   Skin: Negative.  Negative for itching and rash.  Neurological: Negative.   Endo/Heme/Allergies: Negative.   Psychiatric/Behavioral: Negative.     Physical Exam: Estimated body mass index is 32.68 kg/m as calculated from the following:   Height as of this encounter: 5\' 5"  (1.651 m).   Weight as of this encounter: 196 lb 6.4 oz (89.1 kg). BP 140/82   Pulse (!) 101  Temp (!) 97.4 F (36.3 C)   Resp 18   Ht 5\' 5"  (1.651 m)   Wt 196 lb 6.4 oz (89.1 kg)   SpO2 95%   BMI 32.68 kg/m  General Appearance: Well nourished, in no apparent distress.  Eyes: PERRLA, EOMs, conjunctiva no swelling or erythema, normal fundi and vessels.  Sinuses: No Frontal/maxillary tenderness  ENT/Mouth: Ext aud canals clear, normal light reflex with TMs without erythema, bulging. Good dentition. No erythema, swelling, or exudate on post pharynx. Tonsils not swollen or erythematous. Hearing normal.  Neck: Supple, thyroid normal. No bruits  Respiratory: Respiratory effort normal, BS equal bilaterally without rales, rhonchi, wheezing or stridor.  Cardio: RRR without murmurs, rubs or gallops. Brisk peripheral pulses without edema.  Chest: symmetric, with normal  excursions and percussion.  Breasts: Symmetric, no masses, no nipple discharge, no retractions.  Abdomen: Soft, obese, nontender, no guarding, rebound,  masses, or organomegaly.+ ventral hernia and small umbilical hernia Lymphatics: Non tender without lymphadenopathy.  Genitourinary: defer Musculoskeletal: Full ROM all peripheral extremities,5/5 strength, and normal gait.  Skin: well healing scar on right achilles, no warmth, discharge, tenderness Warm, dry without rashes, lesions, ecchymosis. Neuro: Cranial nerves intact, reflexes equal bilaterally. Normal muscle tone, no cerebellar symptoms. Sensation intact.  Psych: Awake and oriented X 3, normal affect, Insight and Judgment appropriate.   EKG: WNL no changes.  Vicie Mutters 2:12 PM [facility]

## 2017-10-21 LAB — BASIC METABOLIC PANEL WITH GFR
BUN: 14 mg/dL (ref 7–25)
CALCIUM: 9.5 mg/dL (ref 8.6–10.4)
CO2: 28 mmol/L (ref 20–32)
CREATININE: 0.59 mg/dL (ref 0.50–1.05)
Chloride: 104 mmol/L (ref 98–110)
GFR, EST AFRICAN AMERICAN: 116 mL/min/{1.73_m2} (ref >=60)
GFR, Est Non African American: 100 mL/min/{1.73_m2} (ref >=60)
Glucose, Bld: 84 mg/dL (ref 65–99)
Potassium: 4.5 mmol/L (ref 3.5–5.3)
Sodium: 141 mmol/L (ref 135–146)

## 2017-10-21 LAB — URINALYSIS, ROUTINE W REFLEX MICROSCOPIC
BACTERIA UA: NONE SEEN /HPF
Bilirubin Urine: NEGATIVE
Glucose, UA: NEGATIVE
HGB URINE DIPSTICK: NEGATIVE
HYALINE CAST: NONE SEEN /LPF
Ketones, ur: NEGATIVE
Nitrite: NEGATIVE
PH: 6 (ref 5.0–8.0)
PROTEIN: NEGATIVE
Specific Gravity, Urine: 1.02 (ref 1.001–1.03)

## 2017-10-21 LAB — CBC WITH DIFFERENTIAL/PLATELET
BASOS PCT: 0.7 %
Basophils Absolute: 63 cells/uL (ref 0–200)
Eosinophils Absolute: 459 cells/uL (ref 15–500)
Eosinophils Relative: 5.1 %
HEMATOCRIT: 44.6 % (ref 35.0–45.0)
HEMOGLOBIN: 15.1 g/dL (ref 11.7–15.5)
LYMPHS ABS: 2286 {cells}/uL (ref 850–3900)
MCH: 29.7 pg (ref 27.0–33.0)
MCHC: 33.9 g/dL (ref 32.0–36.0)
MCV: 87.8 fL (ref 80.0–100.0)
MPV: 9.3 fL (ref 7.5–12.5)
Monocytes Relative: 7.7 %
NEUTROS ABS: 5499 {cells}/uL (ref 1500–7800)
NEUTROS PCT: 61.1 %
Platelets: 354 10*3/uL (ref 140–400)
RBC: 5.08 10*6/uL (ref 3.80–5.10)
RDW: 12.1 % (ref 11.0–15.0)
Total Lymphocyte: 25.4 %
WBC: 9 10*3/uL (ref 3.8–10.8)
WBCMIX: 693 {cells}/uL (ref 200–950)

## 2017-10-21 LAB — MAGNESIUM: Magnesium: 2.1 mg/dL (ref 1.5–2.5)

## 2017-10-21 LAB — LIPID PANEL
Cholesterol: 158 mg/dL (ref <200)
HDL: 32 mg/dL — AB (ref >50)
LDL CHOLESTEROL (CALC): 93 mg/dL
NON-HDL CHOLESTEROL (CALC): 126 mg/dL (ref <130)
Total CHOL/HDL Ratio: 4.9 (calc) (ref <5.0)
Triglycerides: 252 mg/dL — ABNORMAL HIGH (ref <150)

## 2017-10-21 LAB — HEPATIC FUNCTION PANEL
AG Ratio: 1.6 (calc) (ref 1.0–2.5)
ALBUMIN MSPROF: 4.6 g/dL (ref 3.6–5.1)
ALT: 12 U/L (ref 6–29)
AST: 14 U/L (ref 10–35)
Alkaline phosphatase (APISO): 107 U/L (ref 33–130)
BILIRUBIN DIRECT: 0.1 mg/dL (ref 0.0–0.2)
GLOBULIN: 2.9 g/dL (ref 1.9–3.7)
Indirect Bilirubin: 0.2 mg/dL (calc) (ref 0.2–1.2)
Total Bilirubin: 0.3 mg/dL (ref 0.2–1.2)
Total Protein: 7.5 g/dL (ref 6.1–8.1)

## 2017-10-21 LAB — HIV ANTIBODY (ROUTINE TESTING W REFLEX): HIV: NONREACTIVE

## 2017-10-21 LAB — HEMOGLOBIN A1C
EAG (MMOL/L): 6.5 (calc)
HEMOGLOBIN A1C: 5.7 %{Hb} — AB (ref <5.7)
MEAN PLASMA GLUCOSE: 117 (calc)

## 2017-10-21 LAB — MICROALBUMIN / CREATININE URINE RATIO
Creatinine, Urine: 145 mg/dL (ref 20–275)
Microalb Creat Ratio: 19 mcg/mg creat (ref <30)
Microalb, Ur: 2.7 mg/dL

## 2017-10-21 LAB — TSH: TSH: 1.03 m[IU]/L (ref 0.40–4.50)

## 2017-10-21 LAB — HEPATITIS C ANTIBODY
HEP C AB: NONREACTIVE
SIGNAL TO CUT-OFF: 0.01 (ref <1.00)

## 2017-10-21 LAB — VITAMIN D 25 HYDROXY (VIT D DEFICIENCY, FRACTURES): Vit D, 25-Hydroxy: 51 ng/mL (ref 30–100)

## 2017-10-21 NOTE — Progress Notes (Signed)
Pt aware of lab results & voiced understanding of those results.

## 2017-10-21 NOTE — Progress Notes (Signed)
LVM for pt to return office call for LAB results.

## 2017-10-25 ENCOUNTER — Other Ambulatory Visit: Payer: Self-pay

## 2017-10-25 DIAGNOSIS — F3341 Major depressive disorder, recurrent, in partial remission: Secondary | ICD-10-CM

## 2017-10-25 DIAGNOSIS — E785 Hyperlipidemia, unspecified: Secondary | ICD-10-CM

## 2017-10-25 DIAGNOSIS — T7840XA Allergy, unspecified, initial encounter: Secondary | ICD-10-CM

## 2017-10-25 MED ORDER — LORATADINE 10 MG PO TABS: ORAL_TABLET | ORAL | 1 refills | Status: DC

## 2017-10-25 MED ORDER — FLUOXETINE HCL 20 MG PO CAPS: ORAL_CAPSULE | ORAL | 1 refills | Status: DC

## 2017-10-25 MED ORDER — MELOXICAM 7.5 MG PO TABS: 7.5000 mg | ORAL_TABLET | Freq: Every day | ORAL | 1 refills | Status: DC

## 2017-10-25 MED ORDER — MONTELUKAST SODIUM 10 MG PO TABS: 10.0000 mg | ORAL_TABLET | Freq: Every day | ORAL | 2 refills | Status: DC

## 2017-10-25 MED ORDER — SIMVASTATIN 40 MG PO TABS: ORAL_TABLET | ORAL | 1 refills | Status: DC

## 2017-12-13 ENCOUNTER — Other Ambulatory Visit: Payer: Self-pay | Admitting: Internal Medicine

## 2017-12-13 DIAGNOSIS — Z1231 Encounter for screening mammogram for malignant neoplasm of breast: Secondary | ICD-10-CM

## 2018-01-10 ENCOUNTER — Ambulatory Visit
Admission: RE | Admit: 2018-01-10 | Discharge: 2018-01-10 | Disposition: A | Discharge status: Home / Self Care | Source: Ambulatory Visit | Attending: Internal Medicine | Admitting: Internal Medicine

## 2018-01-10 DIAGNOSIS — Z1231 Encounter for screening mammogram for malignant neoplasm of breast: Secondary | ICD-10-CM

## 2018-01-24 ENCOUNTER — Ambulatory Visit (INDEPENDENT_AMBULATORY_CARE_PROVIDER_SITE_OTHER): Admitting: Physician Assistant

## 2018-01-24 ENCOUNTER — Other Ambulatory Visit: Payer: Self-pay | Admitting: Physician Assistant

## 2018-01-24 ENCOUNTER — Encounter: Payer: Self-pay | Admitting: Physician Assistant

## 2018-01-24 VITALS — BP 136/92 | HR 78 | Temp 97.6°F | Ht 65.0 in | Wt 196.6 lb

## 2018-01-24 DIAGNOSIS — Z6832 Body mass index (BMI) 32.0-32.9, adult: Secondary | ICD-10-CM | POA: Diagnosis not present

## 2018-01-24 DIAGNOSIS — Z79899 Other long term (current) drug therapy: Secondary | ICD-10-CM

## 2018-01-24 DIAGNOSIS — F3341 Major depressive disorder, recurrent, in partial remission: Secondary | ICD-10-CM | POA: Diagnosis not present

## 2018-01-24 DIAGNOSIS — E785 Hyperlipidemia, unspecified: Secondary | ICD-10-CM

## 2018-01-24 DIAGNOSIS — R7303 Prediabetes: Secondary | ICD-10-CM | POA: Diagnosis not present

## 2018-01-24 DIAGNOSIS — I10 Essential (primary) hypertension: Secondary | ICD-10-CM | POA: Diagnosis not present

## 2018-01-24 DIAGNOSIS — E6609 Other obesity due to excess calories: Secondary | ICD-10-CM

## 2018-01-24 MED ORDER — TELMISARTAN 20 MG PO TABS: 20.0000 mg | ORAL_TABLET | Freq: Every day | ORAL | 1 refills | Status: DC

## 2018-01-24 NOTE — Progress Notes (Signed)
Assessment and Plan:  Hypertension  -Continue medication, get a monitor and start to check blood pressure at home, if continue to be above 140/90 call the office and make appointment. Continue DASH diet.  Reminder to go to the ER if any CP, SOB, nausea, dizziness, severe HA, changes vision/speech, left arm numbness and tingling, and jaw pain.  Cholesterol  -Continue diet and exercise. Check cholesterol.   Pre-diabetes -Continue diet and exercise. Check A1C   Vitamin D Def - check level and continue medications.    Obesity with co morbidities - long discussion about weight loss, diet, and exercise  Refilled all meds  Continue diet and meds as discussed. Further disposition pending results of labs. Future Appointments  Date Time Provider [place]  10/24/2018  2:00 PM Vicie Mutters, PA-C [facility] None    HPI 62 y.o. female  presents for 3 month follow up with hypertension, hyperlipidemia, prediabetes and vitamin D. Her blood pressure has not been checked at home, BP not at new goal of 120/60, today their BP is BP: (!) 136/92  BP Readings from Last 5 Encounters:  01/24/18 (!) 136/92  10/20/17 140/82  08/06/17 (!) 144/86  03/16/17 140/80  03/09/17 (!) 155/81   She does not workout.  She denies chest pain, shortness of breath, dizziness.  She is on cholesterol medication.  zocor 40mg   and denies myalgias. Her cholesterol is at goal. The cholesterol last visit was:   Lab Results  Component Value Date   CHOL 158 10/20/2017   HDL 32 (L) 10/20/2017   LDLCALC 60 03/16/2017   TRIG 252 (H) 10/20/2017   CHOLHDL 4.9 10/20/2017   She has been working on diet and exercise for prediabetes, and denies paresthesia of the feet, polydipsia, polyuria and visual disturbances. Last A1C in the office was:  Lab Results  Component Value Date   HGBA1C 5.7 (H) 10/20/2017   Patient is on Vitamin D supplement.   Lab Results  Component Value Date   VD25OH 51 10/20/2017     BMI is  Body mass index is 32.72 kg/m., she is struggling with weight loss due to time limitations. She eats 2-3 meals a day. She does not eat out often, she does not over eat/binge eat, some stress eating. She does not drink diet soda. She drinks Wt Readings from Last 3 Encounters:  01/24/18 196 lb 9.6 oz (89.2 kg)  10/20/17 196 lb 6.4 oz (89.1 kg)  08/06/17 197 lb (89.4 kg)    Current Medications:  Current Outpatient Medications on File Prior to Visit  Medication Sig Dispense Refill  . Cholecalciferol (VITAMIN D3) 5000 units TABS Take 5,000 Units by mouth at bedtime.    Marland Kitchen FLUoxetine (PROZAC) 20 MG capsule TAKE 3 CAPSULES (60 MG) DAILY FOR MOOD 270 capsule 1  . loratadine (CLARITIN) 10 MG tablet TAKE 1 TABLET DAILY FOR ALLERGIES 90 tablet 1  . meloxicam (MOBIC) 7.5 MG tablet Take 1 tablet (7.5 mg total) daily by mouth. 90 tablet 1  . montelukast (SINGULAIR) 10 MG tablet Take 1 tablet (10 mg total) daily by mouth. 90 tablet 2  . simvastatin (ZOCOR) 40 MG tablet TAKE 1 TABLET AT BEDTIME 90 tablet 1   No current facility-administered medications on file prior to visit.    Medical History:  Past Medical History:  Diagnosis Date  . Allergy   . Arthritis   . Bronchitis   . Depression    OCD  . Hyperlipidemia   . Obesity (BMI 30.0-34.9)   .  Prediabetes 11/09/2013  . Vitamin D deficiency    Allergies: No Known Allergies   Review of Systems  Constitutional: Negative for chills, diaphoresis, fever, malaise/fatigue and weight loss.  HENT: Negative.   Eyes: Negative.   Respiratory: Negative for hemoptysis, sputum production, shortness of breath and wheezing.   Cardiovascular: Negative.   Gastrointestinal: Negative.   Genitourinary: Negative.   Musculoskeletal: Negative.   Skin: Negative.   Neurological: Negative.  Negative for weakness.  Endo/Heme/Allergies: Negative.   Psychiatric/Behavioral: Negative.     Family history- Review and unchanged Social history- Review and  unchanged Physical Exam: BP (!) 136/92   Pulse 78   Temp 97.6 F (36.4 C)   Ht 5\' 5"  (1.651 m)   Wt 196 lb 9.6 oz (89.2 kg)   SpO2 95%   BMI 32.72 kg/m  Wt Readings from Last 3 Encounters:  01/24/18 196 lb 9.6 oz (89.2 kg)  10/20/17 196 lb 6.4 oz (89.1 kg)  08/06/17 197 lb (89.4 kg)   General Appearance: Well nourished, in no apparent distress. Eyes: PERRLA, EOMs, conjunctiva no swelling or erythema Sinuses: No Frontal/maxillary tenderness ENT/Mouth: Ext aud canals clear, TMs without erythema, bulging. No erythema, swelling, or exudate on post pharynx.  Tonsils not swollen or erythematous. Hearing normal.  Neck: Supple, thyroid normal.  Respiratory: Respiratory effort normal, BS equal bilaterally with wheezing without rales, rhonchi, or stridor.  Cardio: RRR with no MRGs. Brisk peripheral pulses without edema.  Abdomen: Soft, + BS.  Non tender, no guarding, rebound, hernias, masses. Lymphatics: Non tender without lymphadenopathy.  Musculoskeletal: Full ROM, 5/5 strength, normal gait.  Skin:  Warm, dry without rashes, lesions, ecchymosis.  Neuro: Cranial nerves intact. Normal muscle tone, no cerebellar symptoms. Sensation intact.  Psych: Awake and oriented X 3, normal affect, Insight and Judgment appropriate.    Vicie Mutters, PA-C 11:15 AM [facility]

## 2018-01-24 NOTE — Patient Instructions (Signed)
Intermittent fasting is more about strategy than starvation. It's meant to reset your body in different ways, hopefully with fitness and nutrition changes as a result.  Like any big switchover, though, results may vary when it comes down to the individual level. What works for your friends may not work for you, or vice versa. That's why it's helpful to play around with variations on intermittent fasting and healthy habits and find what works best for you.  WHAT IS INTERMITTENT FASTING AND WHY DO IT?  Intermittent fasting doesn't involve specific foods, but rather, a strict schedule regarding when you eat. Also called "time-restricted eating," the tactic has been praised for its contribution to weight loss, improved body composition, and decreased cravings. Preliminary research also suggests it may be beneficial for glucose tolerance, hormone regulation, better muscle mass and lower body fat.  Part of its appeal is the simplicity of the effort. Unlike some other trends, there's no calculations to intermittent fasting.  You simply eat within a certain block of time, usually a window of 8-10 hours. In the other big block of time - about 14-16 hours, including when you're asleep - you don't eat anything, not even snacks. You can drink water, coffee, tea or any other beverage that doesn't have calories.  For example, if you like having a late dinner, you might skip breakfast and have your first meal at noon and your last meal of the day at 8 p.m., and then not eat until noon again the next day.  IDEAS FOR GETTING STARTED  If you're new to the strategy, it may be helpful to eat within the typical circadian rhythm and keep eating within daylight hours. This can be especially beneficial if you're looking at intermittent fasting for weight-loss goals.  So first try only eating between 12pm to 8pm.  Outside of this time you may have water, black coffee, and hot tea. You may not eat it drink anything  that has carbs, sugars, OR artificial sugars like diet soda.   Like any major eating and fitness shift, it can take time to find the perfect fit, so don't be afraid to experiment with different options - including ditching intermittent fasting altogether if it's simply not for you. But if it is, you may be surprised by some of the benefits that come along with the strategy.  Are you an emotional eater? Do you eat more when you're feeling stressed? Do you eat when you're not hungry or when you're full? Do you eat to feel better (to calm and soothe yourself when you're sad, mad, bored, anxious, etc.)? Do you reward yourself with food? Do you regularly eat until you've stuffed yourself? Does food make you feel safe? Do you feel like food is a friend? Do you feel powerless or out of control around food?  If you answered yes to some of these questions than it is likely that you are an emotional eater. This is normally a learned behavior and can take time to first recognize the signs and second BREAK THE HABIT. But here is more information and tips to help.   The difference between emotional hunger and physical hunger Emotional hunger can be powerful, so it's easy to mistake it for physical hunger. But there are clues you can look for to help you tell physical and emotional hunger apart.  Emotional hunger comes on suddenly. It hits you in an instant and feels overwhelming and urgent. Physical hunger, on the other hand, comes on more  gradually. The urge to eat doesn't feel as dire or demand instant satisfaction (unless you haven't eaten for a very long time).  Emotional hunger craves specific comfort foods. When you're physically hungry, almost anything sounds good-including healthy stuff like vegetables. But emotional hunger craves junk food or sugary snacks that provide an instant rush. You feel like you need cheesecake or pizza, and nothing else will do.  Emotional hunger often leads to mindless  eating. Before you know it, you've eaten a whole bag of chips or an entire pint of ice cream without really paying attention or fully enjoying it. When you're eating in response to physical hunger, you're typically more aware of what you're doing.  Emotional hunger isn't satisfied once you're full. You keep wanting more and more, often eating until you're uncomfortably stuffed. Physical hunger, on the other hand, doesn't need to be stuffed. You feel satisfied when your stomach is full.  Emotional hunger isn't located in the stomach. Rather than a growling belly or a pang in your stomach, you feel your hunger as a craving you can't get out of your head. You're focused on specific textures, tastes, and smells.  Emotional hunger often leads to regret, guilt, or shame. When you eat to satisfy physical hunger, you're unlikely to feel guilty or ashamed because you're simply giving your body what it needs. If you feel guilty after you eat, it's likely because you know deep down that you're not eating for nutritional reasons.  Identify your emotional eating triggers What situations, places, or feelings make you reach for the comfort of food? Most emotional eating is linked to unpleasant feelings, but it can also be triggered by positive emotions, such as rewarding yourself for achieving a goal or celebrating a holiday or happy event. Common causes of emotional eating include:  Stuffing emotions - Eating can be a way to temporarily silence or "stuff down" uncomfortable emotions, including anger, fear, sadness, anxiety, loneliness, resentment, and shame. While you're numbing yourself with food, you can avoid the difficult emotions you'd rather not feel.  Boredom or feelings of emptiness - Do you ever eat simply to give yourself something to do, to relieve boredom, or as a way to fill a void in your life? You feel unfulfilled and empty, and food is a way to occupy your mouth and your time. In the moment, it fills  you up and distracts you from underlying feelings of purposelessness and dissatisfaction with your life.  Childhood habits - Think back to your childhood memories of food. Did your parents reward good behavior with ice cream, take you out for pizza when you got a good report card, or serve you sweets when you were feeling sad? These habits can often carry over into adulthood. Or your eating may be driven by nostalgia-for cherished memories of grilling burgers in the backyard with your dad or baking and eating cookies with your mom.  Social influences - Getting together with other people for a meal is a great way to relieve stress, but it can also lead to overeating. It's easy to overindulge simply because the food is there or because everyone else is eating. You may also overeat in social situations out of nervousness. Or perhaps your family or circle of friends encourages you to overeat, and it's easier to go along with the group.  Stress - Ever notice how stress makes you hungry? It's not just in your mind. When stress is chronic, as it so often is in our chaotic,  fast-paced world, your body produces high levels of the stress hormone, cortisol. Cortisol triggers cravings for salty, sweet, and fried foods-foods that give you a burst of energy and pleasure. The more uncontrolled stress in your life, the more likely you are to turn to food for emotional relief.  Find other ways to feed your feelings If you don't know how to manage your emotions in a way that doesn't involve food, you won't be able to control your eating habits for very long. Diets so often fail because they offer logical nutritional advice which only works if you have conscious control over your eating habits. It doesn't work when emotions hijack the process, demanding an immediate payoff with food.  In order to stop emotional eating, you have to find other ways to fulfill yourself emotionally. It's not enough to understand the cycle of  emotional eating or even to understand your triggers, although that's a huge first step. You need alternatives to food that you can turn to for emotional fulfillment.  Alternatives to emotional eating If you're depressed or lonely, call someone who always makes you feel better, play with your dog or cat, or look at a favorite photo or cherished memento.  If you're anxious, expend your nervous energy by dancing to your favorite song, squeezing a stress ball, or taking a brisk walk.  If you're exhausted, treat yourself with a hot cup of tea, take a bath, light some scented candles, or wrap yourself in a warm blanket.  If you're bored, read a good book, watch a comedy show, explore the outdoors, or turn to an activity you enjoy (woodworking, playing the guitar, shooting hoops, scrapbooking, etc.).  What is mindful eating? Mindful eating is a practice that develops your awareness of eating habits and allows you to pause between your triggers and your actions. Most emotional eaters feel powerless over their food cravings. When the urge to eat hits, you feel an almost unbearable tension that demands to be fed, right now. Because you've tried to resist in the past and failed, you believe that your willpower just isn't up to snuff. But the truth is that you have more power over your cravings than you think.  Take 5 before you give in to a craving Emotional eating tends to be automatic and virtually mindless. Before you even realize what you're doing, you've reached for a tub of ice cream and polished off half of it. But if you can take a moment to pause and reflect when you're hit with a craving, you give yourself the opportunity to make a different decision.  Can you put off eating for five minutes? Or just start with one minute. Don't tell yourself you can't give in to the craving; remember, the forbidden is extremely tempting. Just tell yourself to wait.  While you're waiting, check in with yourself.  How are you feeling? What's going on emotionally? Even if you end up eating, you'll have a better understanding of why you did it. This can help you set yourself up for a different response next time.  How to practice mindful eating Eating while you're also doing other things-such as watching TV, driving, or playing with your phone-can prevent you from fully enjoying your food. Since your mind is elsewhere, you may not feel satisfied or continue eating even though you're no longer hungry. Eating more mindfully can help focus your mind on your food and the pleasure of a meal and curb overeating.   Eat your meals in  a calm place with no distractions, aside from any dining companions.  Try eating with your non-dominant hand or using chopsticks instead of a knife and fork. Eating in such a non-familiar way can slow down how fast you eat and ensure your mind stays focused on your food.  Allow yourself enough time not to have to rush your meal. Set a timer for 20 minutes and pace yourself so you spend at least that much time eating.  Take small bites and chew them well, taking time to notice the different flavors and textures of each mouthful.  Put your utensils down between bites. Take time to consider how you feel-hungry, satiated-before picking up your utensils again.  Try to stop eating before you are full.It takes time for the signal to reach your brain that you've had enough. Don't feel obligated to always clean your plate.  When you've finished your food, take a few moments to assess if you're really still hungry before opting for an extra serving or dessert.  Learn to accept your feelings-even the bad ones  While it may seem that the core problem is that you're powerless over food, emotional eating actually stems from feeling powerless over your emotions. You don't feel capable of dealing with your feelings head on, so you avoid them with food.  Recommended reading  Mini Habits for weight  loss  Healthy Eating: A guide to the new nutrition - [place] Report  10 Tips for Mindful Eating - How mindfulness can help you fully enjoy a meal and the experience of eating-with moderation and restraint. ([place])  Weight Loss: Gain Control of Emotional Eating - Tips to regain control of your eating habits. [facility])  Why Stress Causes People to Overeat -Tips on controlling stress eating. ([place])  Mindful Eating Meditations -Free online mindfulness meditations. ([facility])

## 2018-01-25 LAB — BASIC METABOLIC PANEL WITH GFR
BUN: 15 mg/dL (ref 7–25)
CO2: 27 mmol/L (ref 20–32)
CREATININE: 0.67 mg/dL (ref 0.50–1.05)
Calcium: 9.4 mg/dL (ref 8.6–10.4)
Chloride: 104 mmol/L (ref 98–110)
GFR, EST AFRICAN AMERICAN: 112 mL/min/{1.73_m2} (ref >=60)
GFR, EST NON AFRICAN AMERICAN: 96 mL/min/{1.73_m2} (ref >=60)
Glucose, Bld: 95 mg/dL (ref 65–99)
Potassium: 4.5 mmol/L (ref 3.5–5.3)
Sodium: 140 mmol/L (ref 135–146)

## 2018-01-25 LAB — CBC WITH DIFFERENTIAL/PLATELET
BASOS ABS: 89 {cells}/uL (ref 0–200)
Basophils Relative: 1 %
EOS ABS: 427 {cells}/uL (ref 15–500)
EOS PCT: 4.8 %
HEMATOCRIT: 42.4 % (ref 35.0–45.0)
Hemoglobin: 14.6 g/dL (ref 11.7–15.5)
LYMPHS ABS: 2154 {cells}/uL (ref 850–3900)
MCH: 29.7 pg (ref 27.0–33.0)
MCHC: 34.4 g/dL (ref 32.0–36.0)
MCV: 86.4 fL (ref 80.0–100.0)
MPV: 9.5 fL (ref 7.5–12.5)
Monocytes Relative: 7.6 %
NEUTROS ABS: 5554 {cells}/uL (ref 1500–7800)
NEUTROS PCT: 62.4 %
Platelets: 328 10*3/uL (ref 140–400)
RBC: 4.91 10*6/uL (ref 3.80–5.10)
RDW: 12.3 % (ref 11.0–15.0)
Total Lymphocyte: 24.2 %
WBC: 8.9 10*3/uL (ref 3.8–10.8)
WBCMIX: 676 {cells}/uL (ref 200–950)

## 2018-01-25 LAB — HEPATIC FUNCTION PANEL
AG RATIO: 1.6 (calc) (ref 1.0–2.5)
ALKALINE PHOSPHATASE (APISO): 104 U/L (ref 33–130)
ALT: 13 U/L (ref 6–29)
AST: 15 U/L (ref 10–35)
Albumin: 4.6 g/dL (ref 3.6–5.1)
BILIRUBIN DIRECT: 0.1 mg/dL (ref 0.0–0.2)
BILIRUBIN TOTAL: 0.4 mg/dL (ref 0.2–1.2)
Globulin: 2.9 g/dL (calc) (ref 1.9–3.7)
Indirect Bilirubin: 0.3 mg/dL (calc) (ref 0.2–1.2)
Total Protein: 7.5 g/dL (ref 6.1–8.1)

## 2018-01-25 LAB — LIPID PANEL
CHOL/HDL RATIO: 3.7 (calc) (ref <5.0)
Cholesterol: 143 mg/dL (ref <200)
HDL: 39 mg/dL — AB (ref >50)
LDL Cholesterol (Calc): 82 mg/dL (calc)
NON-HDL CHOLESTEROL (CALC): 104 mg/dL (ref <130)
TRIGLYCERIDES: 120 mg/dL (ref <150)

## 2018-01-25 LAB — HEMOGLOBIN A1C
EAG (MMOL/L): 6.6 (calc)
HEMOGLOBIN A1C: 5.8 %{Hb} — AB (ref <5.7)
Mean Plasma Glucose: 120 (calc)

## 2018-01-25 LAB — TSH: TSH: 0.57 mIU/L (ref 0.40–4.50)

## 2018-04-26 IMAGING — MG DIGITAL SCREENING BILATERAL MAMMOGRAM WITH CAD
4 series · 4 of 4 positions shown · non-contrast
Comparison: Previous exam(s).

CLINICAL DATA: Screening.

EXAM:
DIGITAL SCREENING BILATERAL MAMMOGRAM WITH CAD

[R MLO]
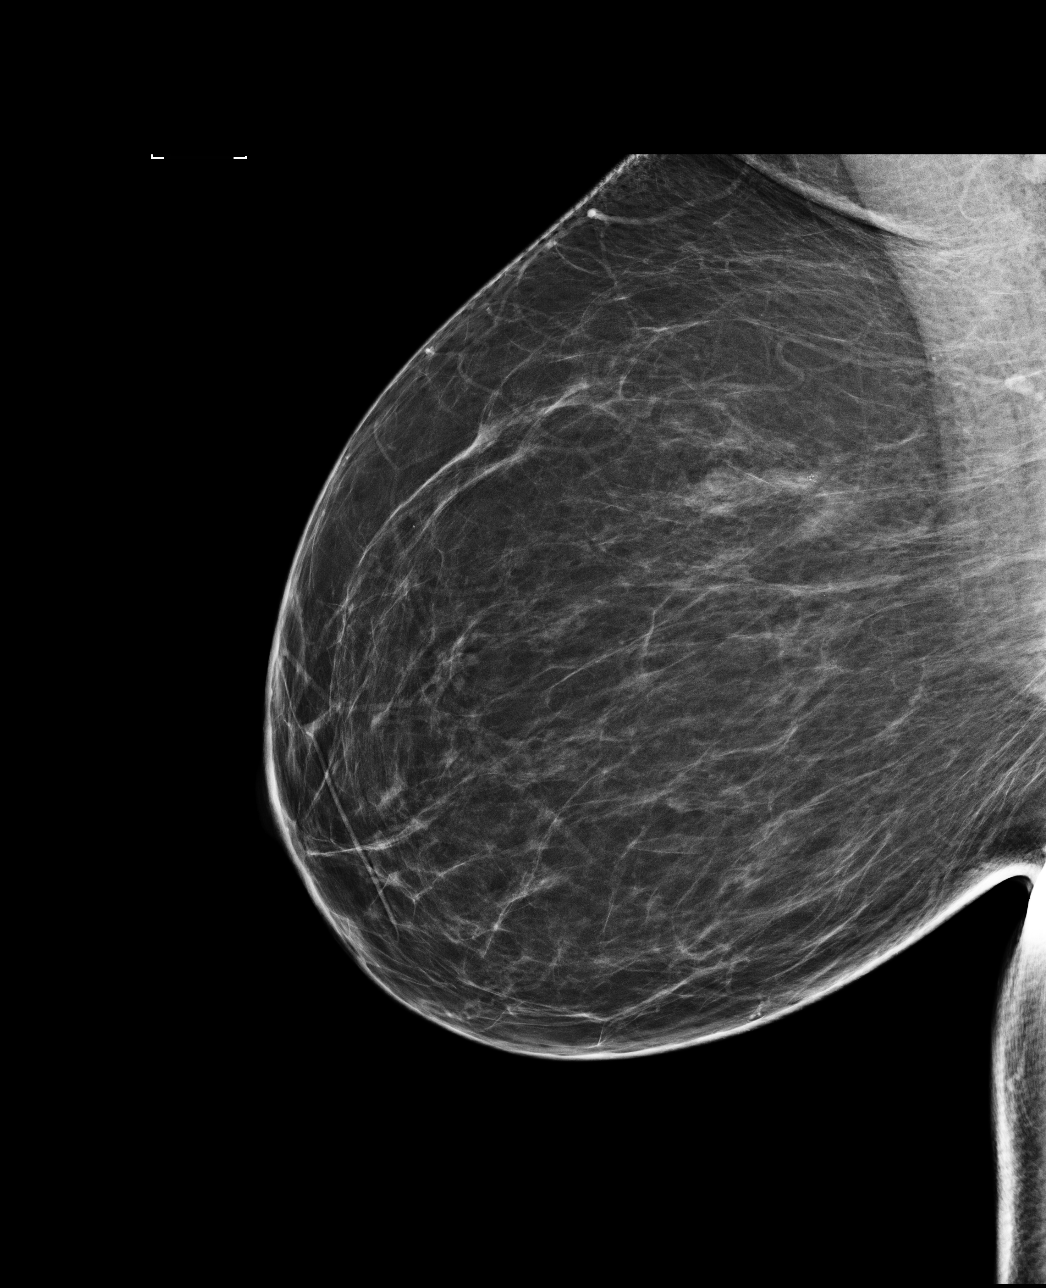

[L MLO]
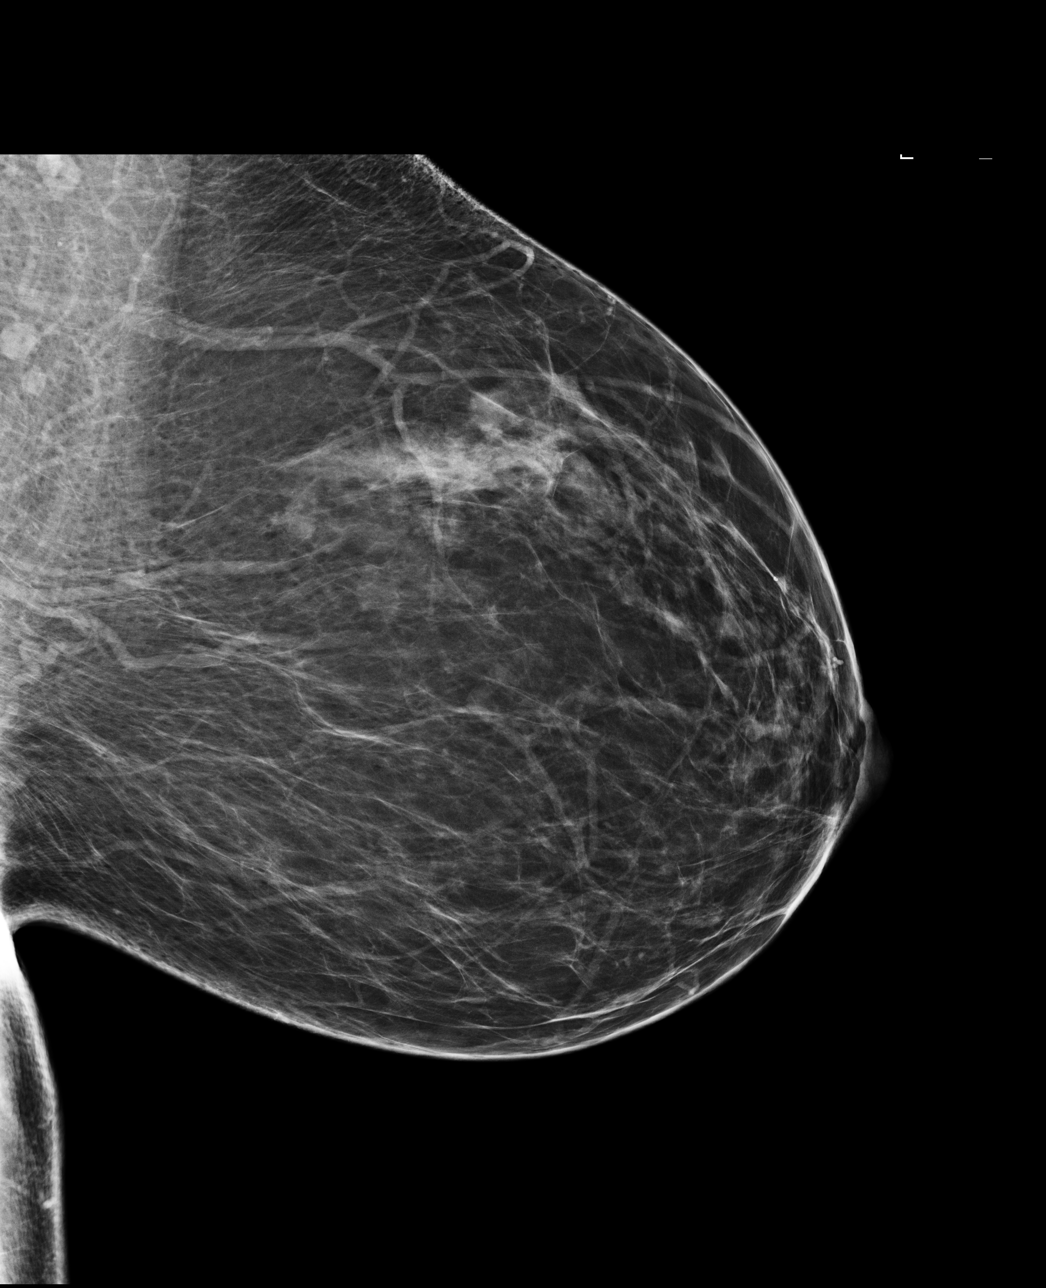

[L CC]
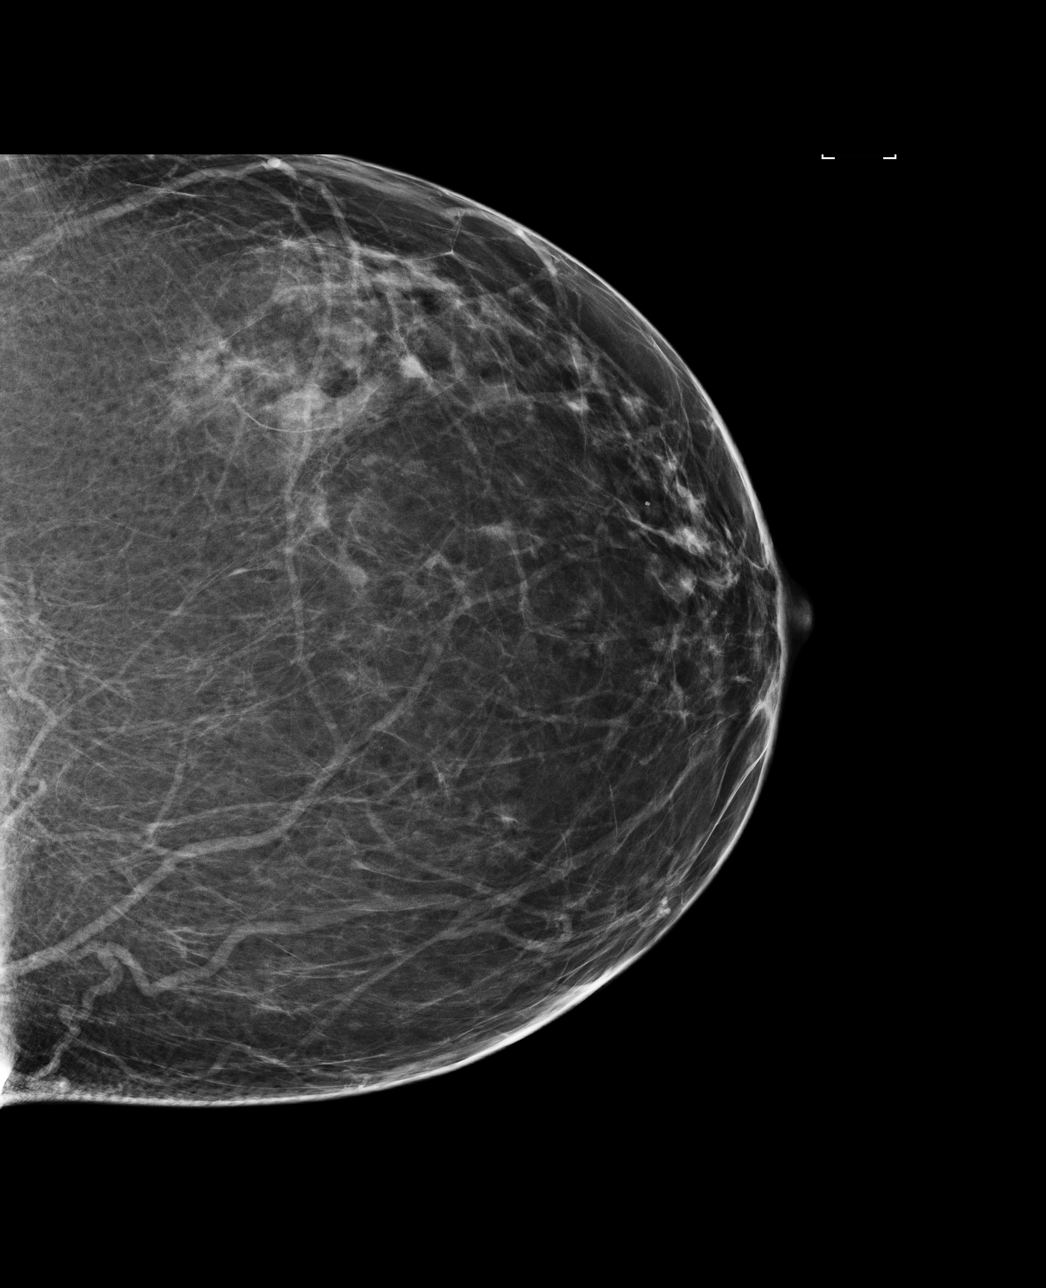

[R CC]
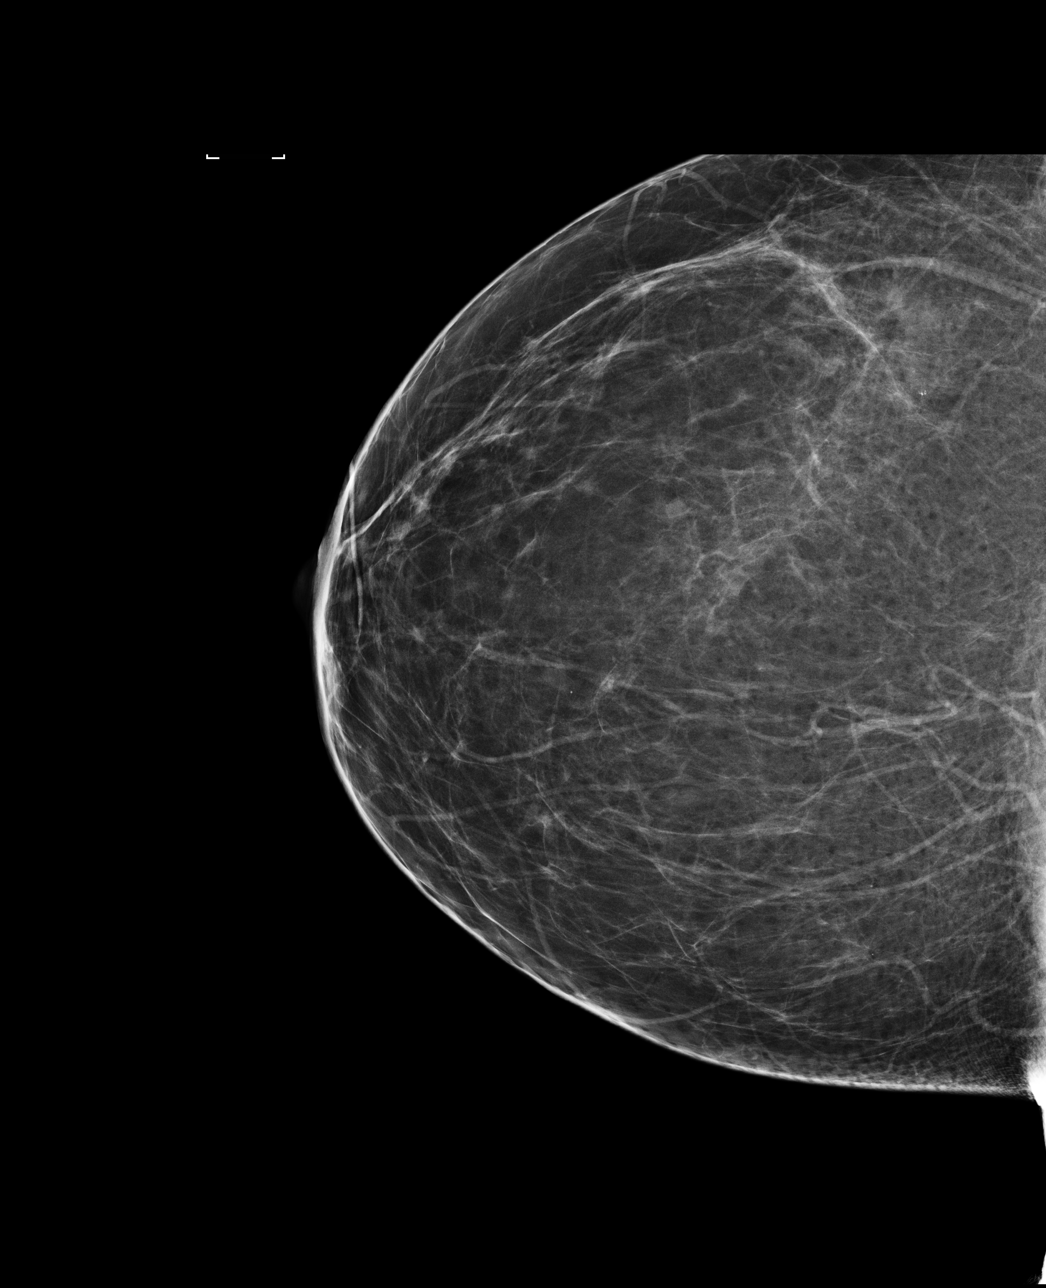

[4 of 4 positions shown; findings below may reference images not displayed]

ACR Breast Density Category b: There are scattered areas of
fibroglandular density.
FINDINGS: There are no findings suspicious for malignancy. Images were
processed with CAD.
IMPRESSION: No mammographic evidence of malignancy. A result letter of this
screening mammogram will be mailed directly to the patient.

RECOMMENDATION:
Screening mammogram in one year. (Code:AS-G-LCT)

BI-RADS CATEGORY  1: Negative.

## 2018-05-24 ENCOUNTER — Other Ambulatory Visit: Payer: Self-pay

## 2018-05-24 DIAGNOSIS — E785 Hyperlipidemia, unspecified: Secondary | ICD-10-CM

## 2018-05-24 MED ORDER — SIMVASTATIN 40 MG PO TABS: ORAL_TABLET | ORAL | 1 refills | Status: DC

## 2018-05-24 NOTE — Telephone Encounter (Signed)
Refill request for Simvastatin. Last office visit on 01/24/18 and next visit on 10/24/18. Please advise.

## 2018-05-30 ENCOUNTER — Other Ambulatory Visit: Payer: Self-pay

## 2018-05-30 DIAGNOSIS — E785 Hyperlipidemia, unspecified: Secondary | ICD-10-CM

## 2018-05-30 MED ORDER — SIMVASTATIN 40 MG PO TABS: ORAL_TABLET | ORAL | 1 refills | Status: DC

## 2018-05-30 NOTE — Telephone Encounter (Signed)
Zocor sent to the wrong pharmacy, please send to mail order.

## 2018-06-20 ENCOUNTER — Other Ambulatory Visit: Payer: Self-pay

## 2018-06-20 DIAGNOSIS — T7840XA Allergy, unspecified, initial encounter: Secondary | ICD-10-CM

## 2018-06-20 MED ORDER — LORATADINE 10 MG PO TABS: ORAL_TABLET | ORAL | 1 refills | Status: DC

## 2018-06-20 NOTE — Telephone Encounter (Signed)
Refill request for Loratadine

## 2018-07-04 ENCOUNTER — Other Ambulatory Visit: Payer: Self-pay | Admitting: Physician Assistant

## 2018-07-04 DIAGNOSIS — F3341 Major depressive disorder, recurrent, in partial remission: Secondary | ICD-10-CM

## 2018-07-05 ENCOUNTER — Other Ambulatory Visit: Payer: Self-pay | Admitting: Physician Assistant

## 2018-07-22 DIAGNOSIS — E669 Obesity, unspecified: Secondary | ICD-10-CM | POA: Insufficient documentation

## 2018-07-22 NOTE — Progress Notes (Signed)
FOLLOW UP  Assessment and Plan:   Hypertension Somewhat elevated; start checking BP; will increase telmisartan to 40 mg daily Monitor blood pressure at home; patient to call if consistently greater than 130/80 Continue DASH diet.   Reminder to go to the ER if any CP, SOB, nausea, dizziness, severe HA, changes vision/speech, left arm numbness and tingling and jaw pain.  Cholesterol Currently at goal; continue statin Continue low cholesterol diet and exercise.  Check lipid panel.   Prediabetes Continue diet and exercise.  Perform daily foot/skin check, notify office of any concerning changes.  Check A1C  Obesity with co morbidities Long discussion about weight loss, diet, and exercise Recommended diet heavy in fruits and veggies and low in animal meats, cheeses, and dairy products, appropriate calorie intake Discussed ideal weight for height and initial weight goal (193 lb) Information for weight loss meds provided, patient will work on lifestyle for 3 months Will follow up in 3 months  Vitamin D Def At goal at last visit; continue supplementation  Defer Vit D level  Depression Continue medications  Lifestyle discussed: diet/exerise, sleep hygiene, stress management, hydration   Continue diet and meds as discussed. Further disposition pending results of labs. Discussed med's effects and SE's.   Over 30 minutes of exam, counseling, chart review, and critical decision making was performed.   Future Appointments  Date Time Provider [place]  10/24/2018  2:00 PM Vicie Mutters, PA-C [id number] None    ----------------------------------------------------------------------------------------------------------------------  HPI 62 y.o. female  presents for 3 month follow up on hypertension, cholesterol, prediabetes, weight and vitamin D deficiency.   She is on prozac for depression; she is normally well controlled on 60 mg daily. She is feeling down today as had to  put her dog down this morning.   BMI is Body mass index is 33.81 kg/m., she has not been working on diet and exercise. She is on her feet all day, walks at work extensively. Wants information about weight loss meds but declines script today.  Wt Readings from Last 3 Encounters:  07/25/18 203 lb 3.2 oz (92.2 kg)  01/24/18 196 lb 9.6 oz (89.2 kg)  10/20/17 196 lb 6.4 oz (89.1 kg)   She does not currently check BP home, today their BP is BP: (!) 142/80  She does not workout. She denies chest pain, shortness of breath, dizziness.   She is on cholesterol medication (simvastatin 40 mg daily) and denies myalgias. Her cholesterol is at goal. The cholesterol last visit was:   Lab Results  Component Value Date   CHOL 143 01/24/2018   HDL 39 (L) 01/24/2018   LDLCALC 82 01/24/2018   TRIG 120 01/24/2018   CHOLHDL 3.7 01/24/2018    She has not been working on diet and exercise for prediabetes, and denies foot ulcerations, increased appetite, nausea, paresthesia of the feet, polydipsia, polyuria, visual disturbances, vomiting and weight loss. Last A1C in the office was:  Lab Results  Component Value Date   HGBA1C 5.8 (H) 01/24/2018   Patient is on Vitamin D supplement.   Lab Results  Component Value Date   VD25OH 51 10/20/2017        Current Medications:  Current Outpatient Medications on File Prior to Visit  Medication Sig  . Cholecalciferol (VITAMIN D3) 5000 units TABS Take 5,000 Units by mouth at bedtime.  Marland Kitchen FLUoxetine (PROZAC) 20 MG capsule TAKE 3 CAPSULES DAILY FOR MOOD  . loratadine (CLARITIN) 10 MG tablet TAKE 1 TABLET DAILY FOR ALLERGIES  .  meloxicam (MOBIC) 7.5 MG tablet Take 1 tablet (7.5 mg total) daily by mouth.  . montelukast (SINGULAIR) 10 MG tablet Take 1 tablet (10 mg total) daily by mouth.  . simvastatin (ZOCOR) 40 MG tablet TAKE 1 TABLET AT BEDTIME  . telmisartan (MICARDIS) 20 MG tablet TAKE 1 TABLET DAILY   No current facility-administered medications on file prior to  visit.      Allergies: No Known Allergies   Medical History:  Past Medical History:  Diagnosis Date  . Allergy   . Arthritis   . Bronchitis   . Depression    OCD  . Hyperlipidemia   . Obesity (BMI 30.0-34.9)   . Prediabetes 11/09/2013  . Vitamin D deficiency    Family history- Reviewed and unchanged Social history- Reviewed and unchanged   Review of Systems:  Review of Systems  Constitutional: Negative for malaise/fatigue and weight loss.  HENT: Negative for hearing loss and tinnitus.   Eyes: Negative for blurred vision and double vision.  Respiratory: Negative for cough, shortness of breath and wheezing.   Cardiovascular: Negative for chest pain, palpitations, orthopnea, claudication and leg swelling.  Gastrointestinal: Negative for abdominal pain, blood in stool, constipation, diarrhea, heartburn, melena, nausea and vomiting.  Genitourinary: Negative.   Musculoskeletal: Negative for joint pain and myalgias.  Skin: Negative for rash.  Neurological: Negative for dizziness, tingling, sensory change, weakness and headaches.  Endo/Heme/Allergies: Negative for polydipsia.  Psychiatric/Behavioral: Negative.   All other systems reviewed and are negative.     Physical Exam: BP (!) 142/80   Pulse 72   Temp 97.7 F (36.5 C)   Resp 16   Ht 5\' 5"  (1.651 m)   Wt 203 lb 3.2 oz (92.2 kg)   BMI 33.81 kg/m  Wt Readings from Last 3 Encounters:  07/25/18 203 lb 3.2 oz (92.2 kg)  01/24/18 196 lb 9.6 oz (89.2 kg)  10/20/17 196 lb 6.4 oz (89.1 kg)   General Appearance: Well nourished, in no apparent distress. Eyes: PERRLA, EOMs, conjunctiva no swelling or erythema Sinuses: No Frontal/maxillary tenderness ENT/Mouth: Ext aud canals clear, TMs without erythema, bulging. No erythema, swelling, or exudate on post pharynx.  Tonsils not swollen or erythematous. Hearing normal.  Neck: Supple, thyroid normal.  Respiratory: Respiratory effort normal, BS equal bilaterally without rales,  rhonchi, wheezing or stridor.  Cardio: RRR with no MRGs. Brisk peripheral pulses without edema.  Abdomen: Soft, + BS.  Non tender, no guarding, rebound, hernias, masses. Lymphatics: Non tender without lymphadenopathy.  Musculoskeletal: Full ROM, 5/5 strength, Normal gait Skin: Warm, dry without rashes, lesions, ecchymosis.  Neuro: Cranial nerves intact. No cerebellar symptoms.  Psych: Awake and oriented X 3, normal affect, Insight and Judgment appropriate.    Izora Ribas, NP 3:56 PM [facility]

## 2018-07-25 ENCOUNTER — Encounter: Payer: Self-pay | Admitting: Adult Health

## 2018-07-25 ENCOUNTER — Ambulatory Visit (INDEPENDENT_AMBULATORY_CARE_PROVIDER_SITE_OTHER): Admitting: Adult Health

## 2018-07-25 VITALS — BP 142/80 | HR 72 | Temp 97.7°F | Resp 16 | Ht 65.0 in | Wt 203.2 lb

## 2018-07-25 DIAGNOSIS — R7303 Prediabetes: Secondary | ICD-10-CM

## 2018-07-25 DIAGNOSIS — I10 Essential (primary) hypertension: Secondary | ICD-10-CM

## 2018-07-25 DIAGNOSIS — E559 Vitamin D deficiency, unspecified: Secondary | ICD-10-CM

## 2018-07-25 DIAGNOSIS — E669 Obesity, unspecified: Secondary | ICD-10-CM

## 2018-07-25 DIAGNOSIS — F3341 Major depressive disorder, recurrent, in partial remission: Secondary | ICD-10-CM

## 2018-07-25 DIAGNOSIS — E785 Hyperlipidemia, unspecified: Secondary | ICD-10-CM

## 2018-07-25 DIAGNOSIS — E782 Mixed hyperlipidemia: Secondary | ICD-10-CM | POA: Diagnosis not present

## 2018-07-25 DIAGNOSIS — Z79899 Other long term (current) drug therapy: Secondary | ICD-10-CM

## 2018-07-25 MED ORDER — TELMISARTAN 80 MG PO TABS: ORAL_TABLET | ORAL | 1 refills | Status: DC

## 2018-07-25 NOTE — Patient Instructions (Addendum)
Know what a healthy weight is for you (roughly BMI <25) and aim to maintain this  Aim for 7+ servings of fruits and vegetables daily  65-80+ fluid ounces of water or unsweet tea for healthy kidneys  Limit to max 1 drink of alcohol per day; avoid smoking/tobacco  Limit animal fats in diet for cholesterol and heart health - choose grass fed whenever available  Avoid highly processed foods, and foods high in saturated/trans fats  Aim for low stress - take time to unwind and care for your mental health  Aim for 150 min of moderate intensity exercise weekly for heart health, and weights twice weekly for bone health  Aim for 7-9 hours of sleep daily    Drink 1/2 your body weight in fluid ounces of water daily; drink a tall glass of water 30 min before meals  Don't eat until you're stuffed- listen to your stomach and eat until you are 80% full   Try eating off of a salad plate; wait 10 min after finishing before going back for seconds  Start by eating the vegetables on your plate; aim for 50% of your meals to be fruits or vegetables  Then eat your protein - lean meats (grass fed if possible), fish, beans, nuts in moderation  Eat your carbs/starch last ONLY if you still are hungry. If you can, stop before finishing it all  Avoid sugar and flour - the closer it looks to it's original form in nature, typically the better it is for you  Splurge in moderation - "assign" days when you get to splurge and have the "bad stuff" - I like to follow a 80% - 20% plan- "good" choices 80 % of the time, "bad" choices in moderation 20% of the time  Simple equation is: Calories out > calories in = weight loss - even if you eat the bad stuff, if you limit portions, you will still lose weight       When it comes to diets, agreement about the perfect plan isn't easy to find, even among the experts. Experts at the [employer] developed an idea known as the Healthy Eating Plate.  Just imagine a plate divided into logical, healthy portions.  The emphasis is on diet quality:  Load up on vegetables and fruits - one-half of your plate: Aim for color and variety, and remember that potatoes don't count.  Go for whole grains - one-quarter of your plate: Whole wheat, barley, wheat berries, quinoa, oats, brown rice, and foods made with them. If you want pasta, go with whole wheat pasta.  Protein power - one-quarter of your plate: Fish, chicken, beans, and nuts are all healthy, versatile protein sources. Limit red meat.  The diet, however, does go beyond the plate, offering a few other suggestions.  Use healthy plant oils, such as olive, canola, soy, corn, sunflower and peanut. Check the labels, and avoid partially hydrogenated oil, which have unhealthy trans fats.  If you're thirsty, drink water. Coffee and tea are good in moderation, but skip sugary drinks and limit milk and dairy products to one or two daily servings.  The type of carbohydrate in the diet is more important than the amount. Some sources of carbohydrates, such as vegetables, fruits, whole grains, and beans-are healthier than others.  Finally, stay active.      Who Qualifies for Obesity Medications? Although everyone is hopeful for a fast and easy way to lose weight, nothing has been shown to replace  a prudent, calorie-controlled diet along with behavior modification as a cornerstone for all obesity treatments.  The next tool that can be used to achieve weight-loss and health improvement is medication. Pharmacotherapy may be offered to individuals affected by obesity who have failed to achieve weight-loss through diet and exercise alone. Currently there are several drugs that are approved by the FDA for weight-loss: phentermine products (Adipex-P or Suprenza)  lorcaserin HCI (Belviq) phentermine- topiramate ER (Qsymia)  Bupropion; Naltrexone ER (Contrave)  Saxenda (liraglutide), once a day weight  loss shot Let's take a closer look at each of these medications and learn how they work:  Phentermine (Adipex-P or Suprenza) How does it work? Phentermine is a medication available by prescription that works on chemicals in the brain to decrease your appetite. It also has a mild stimulant component that adds extra energy. Phentermine is a pill that is taken once a day in the morning time. Tolerance to this medication can develop, so it can only be used for several months at a time. Common side effects are dry mouth, sleeplessness, constipation. Weight-loss: The average weight-loss is 4-5 percent of your weight after one-year. In a 200 pound person, this means about 10 pounds of weight-loss. Patients who receive phentermine can usually expect to see greater weight-loss than those who receive non-pharmacologic care, on average about 13 pounds difference over 12 weeks as reported in one study. Concerns: Due to its stimulant effect, a person's blood pressure and heart rate may increase when on this medication; therefore, you must be monitored closely by a physician who is experienced in prescribing this medication. It cannot be used in patients with some heart conditions (such as poorly controlled blood pressure), glaucoma (increased pressure in your eye), stroke or overactive thyroid. There is some concern for abuse, but this is minimal if the medication is appropriately used as directed by a healthcare professional.  Lorcaserin (Belviq) How does it work? Lorcaserin was approved in June 2012 by the FDA and became commercially available in June 2013. It works by helping you feel full while eating less, and it works on the chemicals in your brain to help decrease your appetite. Weight-loss: In individuals who took the medication for one-year, it has been shown to have an average of 7 percent weight-loss. In a 200 pound person, this would mean a 14 pound weight-loss. Blood sugar, cholesterol and blood  pressure levels have also been shown to improve. Concerns: The most common side effects are headache, dizziness, fatigue, dry mouth, upper ZJQ:BHALPFXT tract infection and nausea.  Response to therapy should be evaluated by week 12.  If a patient has not lost at least 5% of baseline body weigh  Phentermine-Topiramate ER (Qsymia) How does it work? This combination medication was approved by the FDA in July 2012. Topiramate is a medication used to treat seizures. It was found that a common side effect of this medication was weight-loss. Phentermine, as described in this brochure, helps to increase your energy and decrease your appetite. Weight-loss: Among individuals who took the highest does of Qsymia (15 mg phentermine and 92 mg of topiramate ER) for one-year, they achieved an average of 14.4 percent weight-loss. In a 200 pound person, a 14.4 percent weight-loss would mean a loss of 29 pounds. Cholesterol levels have also been shown to improve. Concerns: The most common side effects were dry mouth, constipation and pins-and-needle feeling in extremities. Qsymia should NOT be taken during pregnancy since Topiramate ER, a component of Qsymia, has been associated with  an increased risk of birth defects.  Bupropion; Naltrexone ER (Contrave) How does it work? Works in two areas of your brain, hunger center and reward center to reduce hunger and cravings.  Weight loss In a 56 week trial patients lost more than 5% of their body weight.  Concerns Most common side effects are dry mouth, constipation or diarrhea, headache.  Please take it with a full glass of water and low fat meal.    Follow-up Visits: Patients are given the opportunity to revisit a topic or obtain more information on an area of interest during follow-up visits. The frequency of and interval between follow-up visits is determined on a patient-by-patient basis. Frequent visits (every 3 to 4 weeks) are encouraged until initial  weight-loss goals (5 to 10 percent of body weight) are achieved. At that point, less frequent visits are typically scheduled as needed for individual patients. However, since obesity is considered a chronic life-long problem for many individuals, periodic continual follow up is recommended.   Research has shown that weight-loss as low as 5 percent of initial body weight can lead to favorable improvements in blood pressure, cholesterol, glucose levels and insulin sensitivity. The risk of developing heart disease is reduced the most in patients who have impaired glucose tolerance, type 2 diabetes or high blood pressure.

## 2018-07-26 ENCOUNTER — Telehealth: Payer: Self-pay

## 2018-07-26 LAB — CBC WITH DIFFERENTIAL/PLATELET
BASOS PCT: 0.8 %
Basophils Absolute: 77 cells/uL (ref 0–200)
EOS PCT: 5.3 %
Eosinophils Absolute: 509 cells/uL — ABNORMAL HIGH (ref 15–500)
HCT: 41.1 % (ref 35.0–45.0)
Hemoglobin: 14 g/dL (ref 11.7–15.5)
Lymphs Abs: 2640 cells/uL (ref 850–3900)
MCH: 29.7 pg (ref 27.0–33.0)
MCHC: 34.1 g/dL (ref 32.0–36.0)
MCV: 87.3 fL (ref 80.0–100.0)
MONOS PCT: 6.9 %
MPV: 9.5 fL (ref 7.5–12.5)
Neutro Abs: 5712 cells/uL (ref 1500–7800)
Neutrophils Relative %: 59.5 %
PLATELETS: 318 10*3/uL (ref 140–400)
RBC: 4.71 10*6/uL (ref 3.80–5.10)
RDW: 12.6 % (ref 11.0–15.0)
TOTAL LYMPHOCYTE: 27.5 %
WBC mixed population: 662 cells/uL (ref 200–950)
WBC: 9.6 10*3/uL (ref 3.8–10.8)

## 2018-07-26 LAB — COMPLETE METABOLIC PANEL WITH GFR
AG Ratio: 1.5 (calc) (ref 1.0–2.5)
ALT: 15 U/L (ref 6–29)
AST: 16 U/L (ref 10–35)
Albumin: 4.5 g/dL (ref 3.6–5.1)
Alkaline phosphatase (APISO): 104 U/L (ref 33–130)
BUN: 18 mg/dL (ref 7–25)
CHLORIDE: 104 mmol/L (ref 98–110)
CO2: 28 mmol/L (ref 20–32)
Calcium: 9.6 mg/dL (ref 8.6–10.4)
Creat: 0.99 mg/dL (ref 0.50–1.05)
GFR, EST AFRICAN AMERICAN: 72 mL/min/{1.73_m2} (ref >=60)
GFR, EST NON AFRICAN AMERICAN: 62 mL/min/{1.73_m2} (ref >=60)
GLUCOSE: 98 mg/dL (ref 65–99)
Globulin: 3 g/dL (calc) (ref 1.9–3.7)
Potassium: 4.2 mmol/L (ref 3.5–5.3)
Sodium: 141 mmol/L (ref 135–146)
TOTAL PROTEIN: 7.5 g/dL (ref 6.1–8.1)
Total Bilirubin: 0.2 mg/dL (ref 0.2–1.2)

## 2018-07-26 LAB — HEMOGLOBIN A1C
Hgb A1c MFr Bld: 5.9 % of total Hgb — ABNORMAL HIGH (ref <5.7)
Mean Plasma Glucose: 123 (calc)
eAG (mmol/L): 6.8 (calc)

## 2018-07-26 LAB — LIPID PANEL
Cholesterol: 139 mg/dL (ref <200)
HDL: 38 mg/dL — ABNORMAL LOW (ref >50)
LDL CHOLESTEROL (CALC): 75 mg/dL
NON-HDL CHOLESTEROL (CALC): 101 mg/dL (ref <130)
TRIGLYCERIDES: 155 mg/dL — AB (ref <150)
Total CHOL/HDL Ratio: 3.7 (calc) (ref <5.0)

## 2018-07-26 LAB — TSH: TSH: 1.01 m[IU]/L (ref 0.40–4.50)

## 2018-07-26 NOTE — Telephone Encounter (Signed)
Entered in error

## 2018-08-09 ENCOUNTER — Other Ambulatory Visit: Payer: Self-pay

## 2018-08-09 NOTE — Telephone Encounter (Signed)
Refill request for Prozac, Singulair, Claritin & Zocar

## 2018-08-16 ENCOUNTER — Other Ambulatory Visit: Payer: Self-pay

## 2018-08-16 DIAGNOSIS — T7840XA Allergy, unspecified, initial encounter: Secondary | ICD-10-CM

## 2018-08-16 MED ORDER — MONTELUKAST SODIUM 10 MG PO TABS: 10.0000 mg | ORAL_TABLET | Freq: Every day | ORAL | 2 refills | Status: DC

## 2018-09-20 ENCOUNTER — Ambulatory Visit (INDEPENDENT_AMBULATORY_CARE_PROVIDER_SITE_OTHER)

## 2018-09-20 DIAGNOSIS — Z23 Encounter for immunization: Secondary | ICD-10-CM | POA: Diagnosis not present

## 2018-09-20 NOTE — Progress Notes (Signed)
Influenza given in RT arm.

## 2018-10-20 ENCOUNTER — Encounter: Payer: Self-pay | Admitting: Physician Assistant

## 2018-10-22 NOTE — Progress Notes (Signed)
Complete Physical  Assessment and Plan:  Encounter for general adult medical examination with abnormal findings 1 year  Essential hypertension - continue medications, DASH diet, exercise and monitor at home. Call if greater than 130/80.  -     CBC with Differential/Platelet -     COMPLETE METABOLIC PANEL WITH GFR -     TSH -     Urinalysis, Routine w reflex microscopic -     Microalbumin / creatinine urine ratio -     EKG 12-Lead  Hyperlipidemia, unspecified hyperlipidemia type check lipids decrease fatty foods increase activity.  -     Lipid panel  Prediabetes Discussed disease progression and risks Discussed diet/exercise, weight management and risk modification -     Hemoglobin A1c  Recurrent major depressive disorder, in partial remission ([place]) - continue medications, stress management techniques discussed, increase water, good sleep hygiene discussed, increase exercise, and increase veggies.   Obesity (BMI 30.0-34.9) - follow up 3 months for progress monitoring - increase veggies, decrease carbs - long discussion about weight loss, diet, and exercise  Vitamin D deficiency -     VITAMIN D 25 Hydroxy (Vit-D Deficiency, Fractures)  Allergic state, initial encounter Allergic rhinitis - Allegra OTC, increase H20, allergy hygiene explained.  Medication management -     Magnesium  Left flank pain Acute this morning Will get labs  Go to ER is worse -     Urine Culture -     US Abdomen Complete; Future -     Amylase     Discussed med's effects and SE's. Screening labs and tests as requested with regular follow-up as recommended. Over 40 minutes of exam, counseling, chart review, and critical decision making was performed this visit.  Future Appointments  Date Time Provider [place]  10/30/2019  2:00 PM Vicie Mutters, PA-C [id number] None    HPI  62 y.o. female  presents for a complete physical.  Has left flank pain, woke up with it this AM,  stabbing pain constant, not worse with movement, no injury. Appetite is good, had BM this AM that was normal, no blood/black/diarrhea. No fever, chills. Never have had kidney stone. No urinary symptoms like blood in urine, frequency, urgency, no cough, SOB, wheezing.   Her blood pressure has been controlled at home, today their BP is BP: (!) 142/84 She does not workout. She denies chest pain, shortness of breath, dizziness.  She is on cholesterol medication, zocor 40mg  and denies myalgias. Her cholesterol is at goal. The cholesterol last visit was:   Lab Results  Component Value Date   CHOL 139 07/25/2018   HDL 38 (L) 07/25/2018   LDLCALC 75 07/25/2018   TRIG 155 (H) 07/25/2018   CHOLHDL 3.7 07/25/2018   She has been working on diet and exercise for prediabetes, and denies paresthesia of the feet, polydipsia, polyuria and visual disturbances. Last A1C in the office was:  Lab Results  Component Value Date   HGBA1C 5.9 (H) 07/25/2018   Patient is on Vitamin D supplement.   Lab Results  Component Value Date   VD25OH 51 10/20/2017   She is on prozac 20mg , 3 tabs a day for depression which helps.  BMI is Body mass index is 33.78 kg/m., she is working on diet and exercise. Goal wight is 193.  Wt Readings from Last 3 Encounters:  10/24/18 203 lb (92.1 kg)  07/25/18 203 lb 3.2 oz (92.2 kg)  01/24/18 196 lb 9.6 oz (89.2 kg)  Current Medications:  Current Outpatient Medications on File Prior to Visit  Medication Sig Dispense Refill  . Cholecalciferol (VITAMIN D3) 5000 units TABS Take 5,000 Units by mouth at bedtime.    Marland Kitchen FLUoxetine (PROZAC) 20 MG capsule TAKE 3 CAPSULES DAILY FOR MOOD 270 capsule 1  . loratadine (CLARITIN) 10 MG tablet TAKE 1 TABLET DAILY FOR ALLERGIES 90 tablet 1  . meloxicam (MOBIC) 7.5 MG tablet Take 1 tablet (7.5 mg total) daily by mouth. 90 tablet 1  . montelukast (SINGULAIR) 10 MG tablet Take 1 tablet (10 mg total) by mouth daily. 90 tablet 2  . simvastatin  (ZOCOR) 40 MG tablet TAKE 1 TABLET AT BEDTIME 90 tablet 1  . telmisartan (MICARDIS) 80 MG tablet Take 1/2-1 tab daily for BP goal < 130/80. 90 tablet 1   No current facility-administered medications on file prior to visit.    Health Maintenance:   Immunization History  Administered Date(s) Administered  . Influenza Inj Mdck Quad With Preservative 08/16/2017, 09/20/2018  . Influenza Split 08/31/2013, 10/04/2014, 10/09/2015  . Influenza, Seasonal, Injecte, Preservative Fre 09/21/2016  . Tdap 03/28/2015   Tetanus: 2016 Pneumovax: 1999 Flu vaccine: 2019 Zostavax: N/A  Pap: 03/02/2013 neg will get every 5 years, due age 28 and 89 Diagnostic MGM: 01/2018 DEXA: N/A Colonoscopy: 2011, Dr. Earlean Shawl  EGD:N/A LMP 2008 DEE- unknown  Patient Care Team: Unk Pinto, MD as PCP - General (Internal Medicine) Newt Minion, MD as Consulting Physician (Orthopedic Surgery) Richmond Campbell, MD as Consulting Physician (Gastroenterology)  Medical History:  Past Medical History:  Diagnosis Date  . Allergy   . Arthritis   . Bronchitis   . Depression    OCD  . Hyperlipidemia   . Obesity (BMI 30.0-34.9)   . Prediabetes 11/09/2013  . Vitamin D deficiency    Allergies No Known Allergies  SURGICAL HISTORY She  has a past surgical history that includes Lumbar disc surgery (2004); Tubal ligation; Dilation and curettage of uterus; Heel spur surgery (Left, 2015); Back surgery; Achilles tendon surgery; and I&D extremity (Right, 08/06/2017).   FAMILY HISTORY Her family history includes Cancer (age of onset: 54) in her mother; Diabetes in her mother; Hypertension in her mother.  Breast cancer for mom 55.   SOCIAL HISTORY She  reports that she has never smoked. She has never used smokeless tobacco. She reports that she does not drink alcohol or use drugs.   Review of Systems: Review of Systems  Constitutional: Negative.  Negative for chills, diaphoresis, fever, malaise/fatigue and weight loss.   HENT: Negative for congestion, ear discharge, ear pain, hearing loss, nosebleeds, sinus pain, sore throat and tinnitus.   Eyes: Negative.   Respiratory: Negative.  Negative for cough, shortness of breath and stridor.   Cardiovascular: Negative.  Negative for chest pain and leg swelling.  Gastrointestinal: Negative.   Genitourinary: Positive for flank pain. Negative for dysuria, frequency, hematuria and urgency.  Skin: Negative.  Negative for itching and rash.  Neurological: Negative.   Endo/Heme/Allergies: Negative.   Psychiatric/Behavioral: Negative.     Physical Exam: Estimated body mass index is 33.78 kg/m as calculated from the following:   Height as of this encounter: 5\' 5"  (1.651 m).   Weight as of this encounter: 203 lb (92.1 kg). BP (!) 142/84   Pulse 85   Temp 97.9 F (36.6 C)   Ht 5\' 5"  (1.651 m)   Wt 203 lb (92.1 kg)   SpO2 97%   BMI 33.78 kg/m  General Appearance: Well nourished,  in no apparent distress.  Eyes: PERRLA, EOMs, conjunctiva no swelling or erythema, normal fundi and vessels.  Sinuses: No Frontal/maxillary tenderness  ENT/Mouth: Ext aud canals clear, normal light reflex with TMs without erythema, bulging. Good dentition. No erythema, swelling, or exudate on post pharynx. Tonsils not swollen or erythematous. Hearing normal.  Neck: Supple, thyroid normal. No bruits  Respiratory: Respiratory effort normal, BS equal bilaterally without rales, rhonchi, wheezing or stridor.  Cardio: RRR without murmurs, rubs or gallops. Brisk peripheral pulses without edema.  Chest: symmetric, with normal excursions and percussion.  Breasts: Symmetric, no masses, no nipple discharge, no retractions.  Abdomen: Soft, obese, left upper quadrant tenderness, no guarding, rebound,  masses, or organomegaly.+ ventral hernia and small umbilical hernia Lymphatics: Non tender without lymphadenopathy.  Genitourinary: difficult exam due to large protruding cystocele, pap done  today Musculoskeletal: Full ROM all peripheral extremities,5/5 strength, and normal gait.  Skin:   Warm, dry without rashes, lesions, ecchymosis. Neuro: Cranial nerves intact, reflexes equal bilaterally. Normal muscle tone, no cerebellar symptoms. Sensation intact.  Psych: Awake and oriented X 3, normal affect, Insight and Judgment appropriate.   EKG: WNL no changes.  Vicie Mutters 2:05 PM [facility]

## 2018-10-24 ENCOUNTER — Other Ambulatory Visit (HOSPITAL_COMMUNITY)
Admission: RE | Admit: 2018-10-24 | Discharge: 2018-10-24 | Disposition: A | Discharge status: Home / Self Care | Source: Ambulatory Visit | Attending: Physician Assistant | Admitting: Physician Assistant

## 2018-10-24 ENCOUNTER — Encounter: Payer: Self-pay | Admitting: Physician Assistant

## 2018-10-24 ENCOUNTER — Ambulatory Visit (INDEPENDENT_AMBULATORY_CARE_PROVIDER_SITE_OTHER): Admitting: Physician Assistant

## 2018-10-24 VITALS — BP 142/84 | HR 85 | Temp 97.9°F | Ht 65.0 in | Wt 203.0 lb

## 2018-10-24 DIAGNOSIS — Z13220 Encounter for screening for lipoid disorders: Secondary | ICD-10-CM | POA: Diagnosis not present

## 2018-10-24 DIAGNOSIS — E559 Vitamin D deficiency, unspecified: Secondary | ICD-10-CM | POA: Diagnosis not present

## 2018-10-24 DIAGNOSIS — F3341 Major depressive disorder, recurrent, in partial remission: Secondary | ICD-10-CM

## 2018-10-24 DIAGNOSIS — Z136 Encounter for screening for cardiovascular disorders: Secondary | ICD-10-CM

## 2018-10-24 DIAGNOSIS — Z79899 Other long term (current) drug therapy: Secondary | ICD-10-CM

## 2018-10-24 DIAGNOSIS — R109 Unspecified abdominal pain: Secondary | ICD-10-CM | POA: Diagnosis not present

## 2018-10-24 DIAGNOSIS — Z1329 Encounter for screening for other suspected endocrine disorder: Secondary | ICD-10-CM

## 2018-10-24 DIAGNOSIS — R7303 Prediabetes: Secondary | ICD-10-CM

## 2018-10-24 DIAGNOSIS — I10 Essential (primary) hypertension: Secondary | ICD-10-CM | POA: Diagnosis not present

## 2018-10-24 DIAGNOSIS — E785 Hyperlipidemia, unspecified: Secondary | ICD-10-CM

## 2018-10-24 DIAGNOSIS — Z0000 Encounter for general adult medical examination without abnormal findings: Secondary | ICD-10-CM | POA: Diagnosis not present

## 2018-10-24 DIAGNOSIS — Z124 Encounter for screening for malignant neoplasm of cervix: Secondary | ICD-10-CM | POA: Insufficient documentation

## 2018-10-24 DIAGNOSIS — Z131 Encounter for screening for diabetes mellitus: Secondary | ICD-10-CM

## 2018-10-24 DIAGNOSIS — Z1389 Encounter for screening for other disorder: Secondary | ICD-10-CM | POA: Diagnosis not present

## 2018-10-24 DIAGNOSIS — E669 Obesity, unspecified: Secondary | ICD-10-CM

## 2018-10-24 DIAGNOSIS — Z0001 Encounter for general adult medical examination with abnormal findings: Secondary | ICD-10-CM

## 2018-10-24 DIAGNOSIS — T7840XA Allergy, unspecified, initial encounter: Secondary | ICD-10-CM

## 2018-10-24 DIAGNOSIS — R1012 Left upper quadrant pain: Secondary | ICD-10-CM

## 2018-10-24 NOTE — Patient Instructions (Addendum)
YOU CAN CALL TO MAKE AN ULTRASOUND..  I have put in an order for an ultrasound for you to have You can set them up at your convenience by calling this number [phone] You will likely have the ultrasound at [facility]  If you have any issues call our office and we will set this up for you.   Please go to the ER if you have any severe AB pain, unable to hold down food/water, blood in stool or vomit, chest pain, shortness of breath, or any worsening symptoms.   And look out for a rash  HYPERTENSION INFORMATION  Monitor your blood pressure at home, please keep a record and bring that in with you to your next office visit.   Go to the ER if any CP, SOB, nausea, dizziness, severe HA, changes vision/speech  Flank Pain, Adult Flank pain is pain that is located on the side of the body between the upper abdomen and the back. This area is called the flank. The pain may occur over a short period of time (acute), or it may be long-term or recurring (chronic). It may be mild or severe. Flank pain can be caused by many things, including:  Muscle soreness or injury.  Kidney stones or kidney disease.  Stress.  A disease of the spine (vertebral disk disease).  A lung infection (pneumonia).  Fluid around the lungs (pulmonary edema).  A skin rash caused by the chickenpox virus (shingles).  Tumors that affect the back of the abdomen.  Gallbladder disease.  Follow these instructions at home:  Drink enough fluid to keep your urine clear or pale yellow.  Rest as told by your health care provider.  Take over-the-counter and prescription medicines only as told by your health care provider.  Keep a journal to track what has caused your flank pain and what has made it feel better.  Keep all follow-up visits as told by your health care provider. This is important. Contact a health care provider if:  Your pain is not controlled with medicine.  You have new  symptoms.  Your pain gets worse.  You have a fever.  Your symptoms last longer than 2-3 days.  You have trouble urinating or you are urinating very frequently. Get help right away if:  You have trouble breathing or you are short of breath.  Your abdomen hurts or it is swollen or red.  You have nausea or vomiting.  You feel faint or you pass out.  You have blood in your urine. Summary  Flank pain is pain that is located on the side of the body between the upper abdomen and the back.  The pain may occur over a short period of time (acute), or it may be long-term or recurring (chronic). It may be mild or severe.  Flank pain can be caused by many things.  Contact your health care provider if your symptoms get worse or they last longer than 2-3 days. This information is not intended to replace advice given to you by your health care provider. Make sure you discuss any questions you have with your health care provider. Document Released: 01/14/2006 Document Revised: 02/05/2017 Document Reviewed: 02/05/2017 Elsevier Interactive Patient Education  [employer].  Due to a recent study, SPRINT, we have changed our goal for the systolic or top blood pressure number. Ideally we want your top number at 120.  In the [facility] Trial, 5000 people were randomized to a goal  BP of 120 and 5000 people were randomized to a goal BP of less than 140. The patients with the goal BP at 120 had LESS DEMENTIA, LESS HEART ATTACKS, AND LESS STROKES, AS WELL AS OVERALL DECREASED MORTALITY OR DEATH RATE.   If you are willing, our goal BP is the top number of 120.  Your most recent BP: BP: (!) 142/84   Take your medications faithfully as instructed. Maintain a healthy weight. Get at least 150 minutes of aerobic exercise per week. Minimize salt intake. Minimize alcohol intake  DASH Eating Plan DASH stands for "Dietary Approaches to Stop Hypertension." The DASH eating plan is a healthy eating plan that  has been shown to reduce high blood pressure (hypertension). Additional health benefits may include reducing the risk of type 2 diabetes mellitus, heart disease, and stroke. The DASH eating plan may also help with weight loss. WHAT DO I NEED TO KNOW ABOUT THE DASH EATING PLAN? For the DASH eating plan, you will follow these general guidelines:  Choose foods with a percent daily value for sodium of less than 5% (as listed on the food label).  Use salt-free seasonings or herbs instead of table salt or sea salt.  Check with your health care provider or pharmacist before using salt substitutes.  Eat lower-sodium products, often labeled as "lower sodium" or "no salt added."  Eat fresh foods.  Eat more vegetables, fruits, and low-fat dairy products.  Choose whole grains. Look for the word "whole" as the first word in the ingredient list.  Choose fish and skinless chicken or [place] more often than red meat. Limit fish, poultry, and meat to 6 oz (170 g) each day.  Limit sweets, desserts, sugars, and sugary drinks.  Choose heart-healthy fats.  Limit cheese to 1 oz (28 g) per day.  Eat more home-cooked food and less restaurant, buffet, and fast food.  Limit fried foods.  Cook foods using methods other than frying.  Limit canned vegetables. If you do use them, rinse them well to decrease the sodium.  When eating at a restaurant, ask that your food be prepared with less salt, or no salt if possible. WHAT FOODS CAN I EAT? Seek help from a dietitian for individual calorie needs. Grains Whole grain or whole wheat bread. Brown rice. Whole grain or whole wheat pasta. Quinoa, bulgur, and whole grain cereals. Low-sodium cereals. Corn or whole wheat flour tortillas. Whole grain cornbread. Whole grain crackers. Low-sodium crackers. Vegetables Fresh or frozen vegetables (raw, steamed, roasted, or grilled). Low-sodium or reduced-sodium tomato and vegetable juices. Low-sodium or reduced-sodium tomato  sauce and paste. Low-sodium or reduced-sodium canned vegetables.  Fruits All fresh, canned (in natural juice), or frozen fruits. Meat and Other Protein Products Ground beef (85% or leaner), grass-fed beef, or beef trimmed of fat. Skinless chicken or [place]. Ground chicken or [place]. Pork trimmed of fat. All fish and seafood. Eggs. Dried beans, peas, or lentils. Unsalted nuts and seeds. Unsalted canned beans. Dairy Low-fat dairy products, such as skim or 1% milk, 2% or reduced-fat cheeses, low-fat ricotta or cottage cheese, or plain low-fat yogurt. Low-sodium or reduced-sodium cheeses. Fats and Oils Tub margarines without trans fats. Light or reduced-fat mayonnaise and salad dressings (reduced sodium). Avocado. Safflower, olive, or canola oils. Natural peanut or almond butter. Other Unsalted popcorn and pretzels. The items listed above may not be a complete list of recommended foods or beverages. Contact your dietitian for more options. WHAT FOODS ARE NOT RECOMMENDED? Grains White bread. White pasta. White  rice. Refined cornbread. Bagels and croissants. Crackers that contain trans fat. Vegetables Creamed or fried vegetables. Vegetables in a cheese sauce. Regular canned vegetables. Regular canned tomato sauce and paste. Regular tomato and vegetable juices. Fruits Dried fruits. Canned fruit in light or heavy syrup. Fruit juice. Meat and Other Protein Products Fatty cuts of meat. Ribs, chicken wings, bacon, sausage, bologna, salami, chitterlings, fatback, hot dogs, bratwurst, and packaged luncheon meats. Salted nuts and seeds. Canned beans with salt. Dairy Whole or 2% milk, cream, half-and-half, and cream cheese. Whole-fat or sweetened yogurt. Full-fat cheeses or blue cheese. Nondairy creamers and whipped toppings. Processed cheese, cheese spreads, or cheese curds. Condiments Onion and garlic salt, seasoned salt, table salt, and sea salt. Canned and packaged gravies. Worcestershire sauce. Tartar  sauce. Barbecue sauce. Teriyaki sauce. Soy sauce, including reduced sodium. Steak sauce. Fish sauce. Oyster sauce. Cocktail sauce. Horseradish. Ketchup and mustard. Meat flavorings and tenderizers. Bouillon cubes. Hot sauce. Tabasco sauce. Marinades. Taco seasonings. Relishes. Fats and Oils Butter, stick margarine, lard, shortening, ghee, and bacon fat. Coconut, palm kernel, or palm oils. Regular salad dressings. Other Pickles and olives. Salted popcorn and pretzels. The items listed above may not be a complete list of foods and beverages to avoid. Contact your dietitian for more information. WHERE CAN I FIND MORE INFORMATION? National Heart, Lung, and Blood Institute: travelstabloid.com Document Released: 11/12/2011 Document Revised: 04/09/2014 Document Reviewed: 09/27/2013 [facility] Patient Information 2015 [place], [place]. This information is not intended to replace advice given to you by your health care provider. Make sure you discuss any questions you have with your health care provider.

## 2018-10-25 LAB — CBC WITH DIFFERENTIAL/PLATELET
BASOS PCT: 0.7 %
Basophils Absolute: 79 cells/uL (ref 0–200)
EOS ABS: 362 {cells}/uL (ref 15–500)
Eosinophils Relative: 3.2 %
HCT: 42.8 % (ref 35.0–45.0)
Hemoglobin: 14.5 g/dL (ref 11.7–15.5)
Lymphs Abs: 1865 cells/uL (ref 850–3900)
MCH: 30.1 pg (ref 27.0–33.0)
MCHC: 33.9 g/dL (ref 32.0–36.0)
MCV: 89 fL (ref 80.0–100.0)
MONOS PCT: 9 %
MPV: 9.6 fL (ref 7.5–12.5)
Neutro Abs: 7978 cells/uL — ABNORMAL HIGH (ref 1500–7800)
Neutrophils Relative %: 70.6 %
PLATELETS: 298 10*3/uL (ref 140–400)
RBC: 4.81 10*6/uL (ref 3.80–5.10)
RDW: 12.5 % (ref 11.0–15.0)
TOTAL LYMPHOCYTE: 16.5 %
WBC mixed population: 1017 cells/uL — ABNORMAL HIGH (ref 200–950)
WBC: 11.3 10*3/uL — AB (ref 3.8–10.8)

## 2018-10-25 LAB — COMPLETE METABOLIC PANEL WITH GFR
AG Ratio: 1.6 (calc) (ref 1.0–2.5)
ALBUMIN MSPROF: 4.4 g/dL (ref 3.6–5.1)
ALT: 11 U/L (ref 6–29)
AST: 16 U/L (ref 10–35)
Alkaline phosphatase (APISO): 101 U/L (ref 33–130)
BUN: 19 mg/dL (ref 7–25)
CHLORIDE: 101 mmol/L (ref 98–110)
CO2: 27 mmol/L (ref 20–32)
CREATININE: 0.95 mg/dL (ref 0.50–0.99)
Calcium: 9.5 mg/dL (ref 8.6–10.4)
GFR, EST AFRICAN AMERICAN: 75 mL/min/{1.73_m2} (ref >=60)
GFR, EST NON AFRICAN AMERICAN: 65 mL/min/{1.73_m2} (ref >=60)
GLOBULIN: 2.8 g/dL (ref 1.9–3.7)
GLUCOSE: 99 mg/dL (ref 65–99)
Potassium: 4.6 mmol/L (ref 3.5–5.3)
Sodium: 138 mmol/L (ref 135–146)
Total Bilirubin: 0.3 mg/dL (ref 0.2–1.2)
Total Protein: 7.2 g/dL (ref 6.1–8.1)

## 2018-10-25 LAB — URINALYSIS, ROUTINE W REFLEX MICROSCOPIC
BACTERIA UA: NONE SEEN /HPF
BILIRUBIN URINE: NEGATIVE
GLUCOSE, UA: NEGATIVE
Hyaline Cast: NONE SEEN /LPF
KETONES UR: NEGATIVE
LEUKOCYTES UA: NEGATIVE
NITRITE: NEGATIVE
PROTEIN: NEGATIVE
Specific Gravity, Urine: 1.028 (ref 1.001–1.03)
WBC UA: NONE SEEN /HPF (ref 0–5)
pH: 7 (ref 5.0–8.0)

## 2018-10-25 LAB — VITAMIN D 25 HYDROXY (VIT D DEFICIENCY, FRACTURES): Vit D, 25-Hydroxy: 57 ng/mL (ref 30–100)

## 2018-10-25 LAB — MICROALBUMIN / CREATININE URINE RATIO
CREATININE, URINE: 115 mg/dL (ref 20–275)
Microalb Creat Ratio: 15 mcg/mg creat (ref <30)
Microalb, Ur: 1.7 mg/dL

## 2018-10-25 LAB — URINE CULTURE
MICRO NUMBER: 91387249
RESULT: NO GROWTH
SPECIMEN QUALITY:: ADEQUATE

## 2018-10-25 LAB — LIPID PANEL
CHOL/HDL RATIO: 3.7 (calc) (ref <5.0)
Cholesterol: 139 mg/dL (ref <200)
HDL: 38 mg/dL — AB (ref >50)
LDL CHOLESTEROL (CALC): 80 mg/dL
NON-HDL CHOLESTEROL (CALC): 101 mg/dL (ref <130)
TRIGLYCERIDES: 118 mg/dL (ref <150)

## 2018-10-25 LAB — AMYLASE: AMYLASE: 34 U/L (ref 21–101)

## 2018-10-25 LAB — HEMOGLOBIN A1C
HEMOGLOBIN A1C: 5.8 %{Hb} — AB (ref <5.7)
Mean Plasma Glucose: 120 (calc)
eAG (mmol/L): 6.6 (calc)

## 2018-10-25 LAB — MAGNESIUM: MAGNESIUM: 2 mg/dL (ref 1.5–2.5)

## 2018-10-25 LAB — TSH: TSH: 1.08 mIU/L (ref 0.40–4.50)

## 2018-10-25 MED ORDER — CIPROFLOXACIN HCL 500 MG PO TABS: 500.0000 mg | ORAL_TABLET | Freq: Two times a day (BID) | ORAL | 0 refills | Status: AC

## 2018-10-25 NOTE — Addendum Note (Signed)
Addended by: Vicie Mutters R on: 10/25/2018 08:36 AM   Modules accepted: Orders

## 2018-10-28 ENCOUNTER — Ambulatory Visit
Admission: RE | Admit: 2018-10-28 | Discharge: 2018-10-28 | Disposition: A | Discharge status: Home / Self Care | Source: Ambulatory Visit | Attending: Physician Assistant | Admitting: Physician Assistant

## 2018-10-28 ENCOUNTER — Other Ambulatory Visit: Payer: Self-pay | Admitting: Physician Assistant

## 2018-10-28 DIAGNOSIS — R1012 Left upper quadrant pain: Secondary | ICD-10-CM

## 2018-11-01 ENCOUNTER — Other Ambulatory Visit: Payer: Self-pay | Admitting: Adult Health

## 2018-11-01 ENCOUNTER — Telehealth: Payer: Self-pay | Admitting: Physician Assistant

## 2018-11-01 DIAGNOSIS — N200 Calculus of kidney: Secondary | ICD-10-CM

## 2018-11-01 DIAGNOSIS — E785 Hyperlipidemia, unspecified: Secondary | ICD-10-CM

## 2018-11-01 LAB — CYTOLOGY - PAP
Diagnosis: NEGATIVE
HPV (WINDOPATH): NOT DETECTED

## 2018-11-01 NOTE — Telephone Encounter (Signed)
Patient called, medicine for kidney stone was not called to pharmacy, [facility].  Also, requesting referral to Urology.

## 2018-11-02 MED ORDER — TAMSULOSIN HCL 0.4 MG PO CAPS: 0.4000 mg | ORAL_CAPSULE | Freq: Every day | ORAL | 0 refills | Status: DC

## 2018-11-02 NOTE — Addendum Note (Signed)
Addended by: Vicie Mutters R on: 11/02/2018 09:04 AM   Modules accepted: Orders

## 2018-12-05 ENCOUNTER — Telehealth: Payer: Self-pay | Admitting: Physician Assistant

## 2018-12-05 MED ORDER — HYDROCODONE-ACETAMINOPHEN 5-325 MG PO TABS
1.0000 | ORAL_TABLET | Freq: Three times a day (TID) | ORAL | 0 refills | Status: AC | PRN
Start: 2018-12-05 — End: 2018-12-10

## 2018-12-05 NOTE — Telephone Encounter (Signed)
LMTCB

## 2018-12-05 NOTE — Telephone Encounter (Signed)
Patient aware.

## 2018-12-05 NOTE — Telephone Encounter (Signed)
Patient has known kidney stone, she is on flomax, still having pain. Will do hydrocodone 5 mg take 1/2-1 TID PRN, only 5 days given.  IF ANY WORSENING PAIN GO TO THE ER  I have reviewed adverse effects to taking opioids, to include constipation, dizziness, drowsiness, confusion. I have explained that opiods are safe for short term use for acute pain but are not for chronic pain, if the pain continues the patient needs a reevaluation here in the office or if the pain worsens go to the ER. Opioid tolerance, dependence and addiction was discussed with the patient.   Patient understands and agrees to remain compliant with the treatment plan, and understands the importance of placing long term goals for pain management that does not require opioids.    [place] Controlled Substance Reporting System was reviewed and it is compliant with the prescribed opioids.

## 2018-12-28 ENCOUNTER — Other Ambulatory Visit: Payer: Self-pay

## 2018-12-28 DIAGNOSIS — F3341 Major depressive disorder, recurrent, in partial remission: Secondary | ICD-10-CM

## 2018-12-28 DIAGNOSIS — T7840XA Allergy, unspecified, initial encounter: Secondary | ICD-10-CM

## 2018-12-28 MED ORDER — FLUOXETINE HCL 20 MG PO CAPS: ORAL_CAPSULE | ORAL | 1 refills | Status: DC

## 2018-12-28 MED ORDER — LORATADINE 10 MG PO TABS: ORAL_TABLET | ORAL | 1 refills | Status: DC

## 2019-02-02 DIAGNOSIS — R7309 Other abnormal glucose: Secondary | ICD-10-CM | POA: Insufficient documentation

## 2019-02-02 NOTE — Progress Notes (Signed)
Assessment and Plan:  Hypertension  -Continue medication, get a monitor and start to check blood pressure at home, if continue to be above 140/90 call the office and make appointment. Continue DASH diet.  Reminder to go to the ER if any CP, SOB, nausea, dizziness, severe HA, changes vision/speech, left arm numbness and tingling, and jaw pain.  Cholesterol  -Continue diet and exercise. Check cholesterol.   Pre-diabetes -Continue diet and exercise. Check A1C   Vitamin D Def - check level and continue medications.    Obesity with co morbidities - long discussion about weight loss, diet, and exercise  Left flank pain  Has CT Nov showing left kidney stone, still having issues.  She is due for colonoscopy next year Has referral to urology, has not see them yet  Continue diet and meds as discussed. Further disposition pending results of labs. Future Appointments  Date Time Provider [place]  10/30/2019  2:00 PM Vicie Mutters, PA-C [id number] None    HPI 63 y.o. female  presents for 3 month follow up with hypertension, hyperlipidemia, prediabetes and vitamin D. Her blood pressure has not been checked at home, BP not at new goal of 120/60, today their BP is BP: 120/88  BP Readings from Last 5 Encounters:  02/06/19 120/88  10/24/18 (!) 142/84  07/25/18 (!) 142/80  01/24/18 (!) 136/92  10/20/17 140/82   She does not workout.  She denies chest pain, shortness of breath, dizziness.  She is on cholesterol medication.  zocor 40mg   and denies myalgias. Her cholesterol is at goal. The cholesterol last visit was:   Lab Results  Component Value Date   CHOL 139 10/24/2018   HDL 38 (L) 10/24/2018   LDLCALC 80 10/24/2018   TRIG 118 10/24/2018   CHOLHDL 3.7 10/24/2018   She has been working on diet and exercise for prediabetes, and denies paresthesia of the feet, polydipsia, polyuria and visual disturbances. Last A1C in the office was:  Lab Results  Component Value Date   HGBA1C  5.8 (H) 10/24/2018   Patient is on Vitamin D supplement.   Lab Results  Component Value Date   VD25OH 57 10/24/2018     BMI is Body mass index is 34.41 kg/m., she is struggling with weight loss due to time limitations. She eats 2-3 meals a day. She does not eat out often, she does not over eat/binge eat, some stress eating. She does not drink diet soda. She drinks Wt Readings from Last 3 Encounters:  02/06/19 206 lb 12.8 oz (93.8 kg)  10/24/18 203 lb (92.1 kg)  07/25/18 203 lb 3.2 oz (92.2 kg)    Current Medications:  Current Outpatient Medications on File Prior to Visit  Medication Sig Dispense Refill  . Cholecalciferol (VITAMIN D3) 5000 units TABS Take 5,000 Units by mouth at bedtime.    Marland Kitchen FLUoxetine (PROZAC) 20 MG capsule TAKE 3 CAPSULES DAILY FOR MOOD 270 capsule 1  . loratadine (CLARITIN) 10 MG tablet TAKE 1 TABLET DAILY FOR ALLERGIES 90 tablet 1  . meloxicam (MOBIC) 7.5 MG tablet Take 1 tablet (7.5 mg total) daily by mouth. 90 tablet 1  . montelukast (SINGULAIR) 10 MG tablet Take 1 tablet (10 mg total) by mouth daily. 90 tablet 2  . simvastatin (ZOCOR) 40 MG tablet TAKE 1 TABLET AT BEDTIME (NEED OFFICE VISIT) 90 tablet 3  . telmisartan (MICARDIS) 80 MG tablet Take 1/2-1 tab daily for BP goal < 130/80. 90 tablet 1   No current facility-administered medications on  file prior to visit.    Medical History:  Past Medical History:  Diagnosis Date  . Allergy   . Arthritis   . Bronchitis   . Depression    OCD  . Hyperlipidemia   . Obesity (BMI 30.0-34.9)   . Prediabetes 11/09/2013  . Vitamin D deficiency    Allergies: No Known Allergies   Review of Systems  Constitutional: Negative for chills, diaphoresis, fever, malaise/fatigue and weight loss.  HENT: Negative.   Eyes: Negative.   Respiratory: Negative for hemoptysis, sputum production, shortness of breath and wheezing.   Cardiovascular: Negative.   Gastrointestinal: Negative.   Genitourinary: Negative.    Musculoskeletal: Negative.   Skin: Negative.   Neurological: Negative.  Negative for weakness.  Endo/Heme/Allergies: Negative.   Psychiatric/Behavioral: Negative.     Family history- Review and unchanged Social history- Review and unchanged Physical Exam: BP 120/88   Pulse 70   Temp 97.8 F (36.6 C)   Ht 5\' 5"  (1.651 m)   Wt 206 lb 12.8 oz (93.8 kg)   SpO2 95%   BMI 34.41 kg/m  Wt Readings from Last 3 Encounters:  02/06/19 206 lb 12.8 oz (93.8 kg)  10/24/18 203 lb (92.1 kg)  07/25/18 203 lb 3.2 oz (92.2 kg)   General Appearance: Well nourished, in no apparent distress. Eyes: PERRLA, EOMs, conjunctiva no swelling or erythema Sinuses: No Frontal/maxillary tenderness ENT/Mouth: Ext aud canals clear, TMs without erythema, bulging. No erythema, swelling, or exudate on post pharynx.  Tonsils not swollen or erythematous. Hearing normal.  Neck: Supple, thyroid normal.  Respiratory: Respiratory effort normal, BS equal bilaterally with wheezing without rales, rhonchi, or stridor.  Cardio: RRR with no MRGs. Brisk peripheral pulses without edema.  Abdomen: Soft, + BS.  Non tender, no guarding, rebound, hernias, masses. Lymphatics: Non tender without lymphadenopathy.  Musculoskeletal: Full ROM, 5/5 strength, normal gait.  Skin:  Warm, dry without rashes, lesions, ecchymosis.  Neuro: Cranial nerves intact. Normal muscle tone, no cerebellar symptoms. Sensation intact.  Psych: Awake and oriented X 3, normal affect, Insight and Judgment appropriate.    Vicie Mutters, PA-C 4:22 PM [facility]

## 2019-02-06 ENCOUNTER — Ambulatory Visit (INDEPENDENT_AMBULATORY_CARE_PROVIDER_SITE_OTHER): Admitting: Physician Assistant

## 2019-02-06 ENCOUNTER — Encounter: Payer: Self-pay | Admitting: Physician Assistant

## 2019-02-06 VITALS — BP 120/88 | HR 70 | Temp 97.8°F | Ht 65.0 in | Wt 206.8 lb

## 2019-02-06 DIAGNOSIS — E785 Hyperlipidemia, unspecified: Secondary | ICD-10-CM | POA: Diagnosis not present

## 2019-02-06 DIAGNOSIS — Z79899 Other long term (current) drug therapy: Secondary | ICD-10-CM | POA: Diagnosis not present

## 2019-02-06 DIAGNOSIS — R7309 Other abnormal glucose: Secondary | ICD-10-CM

## 2019-02-06 DIAGNOSIS — E559 Vitamin D deficiency, unspecified: Secondary | ICD-10-CM | POA: Diagnosis not present

## 2019-02-06 DIAGNOSIS — I10 Essential (primary) hypertension: Secondary | ICD-10-CM | POA: Diagnosis not present

## 2019-02-06 NOTE — Patient Instructions (Signed)
Kidney Stones  Kidney stones (urolithiasis) are solid, rock-like deposits that form inside of the organs that make urine (kidneys). A kidney stone may form in a kidney and move into the bladder, where it can cause intense pain and block the flow of urine. Kidney stones are created when high levels of certain minerals are found in the urine. They are usually passed through urination, but in some cases, medical treatment may be needed to remove them. What are the causes? Kidney stones may be caused by:  A condition in which certain glands produce too much parathyroid hormone (primary hyperparathyroidism), which causes too much calcium buildup in the blood.  Buildup of uric acid crystals in the bladder (hyperuricosuria). Uric acid is a chemical that the body produces when you eat certain foods. It usually exits the body in the urine.  Narrowing (stricture) of one or both of the tubes that drain urine from the kidneys to the bladder (ureters).  A kidney blockage that is present at birth (congenital obstruction).  Past surgery on the kidney or the ureters, such as gastric bypass surgery. What increases the risk? The following factors make you more likely to develop kidney stones:  Having had a kidney stone in the past.  Having a family history of kidney stones.  Not drinking enough water.  Eating a diet that is high in protein, salt (sodium), or sugar.  Being overweight or obese. What are the signs or symptoms? Symptoms of a kidney stone may include:  Nausea.  Vomiting.  Blood in the urine (hematuria).  Pain in the side of the abdomen, right below the ribs (flank pain). Pain usually spreads (radiates) to the groin.  Needing to urinate frequently or urgently. How is this diagnosed? This condition may be diagnosed based on:  Your medical history.  A physical exam.  Blood tests.  Urine tests.  CT scan.  Abdominal X-ray.  A procedure to examine the inside of the  bladder (cystoscopy). How is this treated? Treatment for kidney stones depends on the size, location, and makeup of the stones. Treatment may involve:  Analyzing your urine before and after you pass the stone through urination.  Being monitored at the hospital until you pass the stone through urination.  Increasing your fluid intake and decreasing the amount of calcium and protein in your diet.  A procedure to break up kidney stones in the bladder using: ? A focused beam of light (laser therapy). ? Shock waves (extracorporeal shock wave lithotripsy).  Surgery to remove kidney stones. This may be needed if you have severe pain or have stones that block your urinary tract. Follow these instructions at home: Eating and drinking  Drink enough fluid to keep your urine clear or pale yellow. This will help you to pass the kidney stone.  If directed, change your diet. This may include: ? Limiting how much sodium you eat. ? Eating more fruits and vegetables. ? Limiting how much meat, poultry, fish, and eggs you eat.  Follow instructions from your health care provider about eating or drinking restrictions. General instructions  Collect urine samples as told by your health care provider. You may need to collect a urine sample: ? 24 hours after you pass the stone. ? 8-12 weeks after passing the kidney stone, and every 6-12 months after that.  Strain your urine every time you urinate, for as long as directed. Use the strainer that your health care provider recommends.  Do not throw out the kidney stone after  passing it. Keep the stone so it can be tested by your health care provider. Testing the makeup of your kidney stone may help prevent you from getting kidney stones in the future.  Take over-the-counter and prescription medicines only as told by your health care provider.  Keep all follow-up visits as told by your health care provider. This is important. You may need follow-up X-rays or  ultrasounds to make sure that your stone has passed. How is this prevented? To prevent another kidney stone:  Drink enough fluid to keep your urine clear or pale yellow. This is the best way to prevent kidney stones.  Eat a healthy diet and follow recommendations from your health care provider about foods to avoid. You may be instructed to eat a low-protein diet. Recommendations vary depending on the type of kidney stone that you have.  Maintain a healthy weight. Contact a health care provider if:  You have pain that gets worse or does not get better with medicine. Get help right away if:  You have a fever or chills.  You develop severe pain.  You develop new abdominal pain.  You faint.  You are unable to urinate. This information is not intended to replace advice given to you by your health care provider. Make sure you discuss any questions you have with your health care provider. Document Released: 11/23/2005 Document Revised: 05/06/2017 Document Reviewed: 05/08/2016 Elsevier Interactive Patient Education  [employer].      When it comes to diets, agreement about the perfect plan isn't easy to find, even among the experts. Experts at the [employer] developed an idea known as the Healthy Eating Plate. Just imagine a plate divided into logical, healthy portions.  The emphasis is on diet quality:  Load up on vegetables and fruits - one-half of your plate: Aim for color and variety, and remember that potatoes don't count.  Go for whole grains - one-quarter of your plate: Whole wheat, barley, wheat berries, quinoa, oats, brown rice, and foods made with them. If you want pasta, go with whole wheat pasta.  Protein power - one-quarter of your plate: Fish, chicken, beans, and nuts are all healthy, versatile protein sources. Limit red meat.  The diet, however, does go beyond the plate, offering a few other suggestions.  Use healthy plant oils, such as  olive, canola, soy, corn, sunflower and peanut. Check the labels, and avoid partially hydrogenated oil, which have unhealthy trans fats.  If you're thirsty, drink water. Coffee and tea are good in moderation, but skip sugary drinks and limit milk and dairy products to one or two daily servings.  The type of carbohydrate in the diet is more important than the amount. Some sources of carbohydrates, such as vegetables, fruits, whole grains, and beans-are healthier than others.  Finally, stay active.

## 2019-02-07 LAB — COMPLETE METABOLIC PANEL WITH GFR
AG Ratio: 1.5 (calc) (ref 1.0–2.5)
ALT: 13 U/L (ref 6–29)
AST: 15 U/L (ref 10–35)
Albumin: 4.5 g/dL (ref 3.6–5.1)
Alkaline phosphatase (APISO): 92 U/L (ref 37–153)
BUN: 18 mg/dL (ref 7–25)
CO2: 29 mmol/L (ref 20–32)
Calcium: 9.5 mg/dL (ref 8.6–10.4)
Chloride: 104 mmol/L (ref 98–110)
Creat: 0.72 mg/dL (ref 0.50–0.99)
GFR, EST AFRICAN AMERICAN: 105 mL/min/{1.73_m2} (ref >=60)
GFR, Est Non African American: 91 mL/min/{1.73_m2} (ref >=60)
Globulin: 3 g/dL (calc) (ref 1.9–3.7)
Glucose, Bld: 87 mg/dL (ref 65–99)
Potassium: 4 mmol/L (ref 3.5–5.3)
Sodium: 141 mmol/L (ref 135–146)
TOTAL PROTEIN: 7.5 g/dL (ref 6.1–8.1)
Total Bilirubin: 0.3 mg/dL (ref 0.2–1.2)

## 2019-02-07 LAB — CBC WITH DIFFERENTIAL/PLATELET
ABSOLUTE MONOCYTES: 670 {cells}/uL (ref 200–950)
BASOS ABS: 96 {cells}/uL (ref 0–200)
Basophils Relative: 1.1 %
EOS ABS: 496 {cells}/uL (ref 15–500)
EOS PCT: 5.7 %
HEMATOCRIT: 42 % (ref 35.0–45.0)
Hemoglobin: 14 g/dL (ref 11.7–15.5)
Lymphs Abs: 2549 cells/uL (ref 850–3900)
MCH: 29.2 pg (ref 27.0–33.0)
MCHC: 33.3 g/dL (ref 32.0–36.0)
MCV: 87.7 fL (ref 80.0–100.0)
MPV: 9.6 fL (ref 7.5–12.5)
Monocytes Relative: 7.7 %
NEUTROS PCT: 56.2 %
Neutro Abs: 4889 cells/uL (ref 1500–7800)
Platelets: 337 10*3/uL (ref 140–400)
RBC: 4.79 10*6/uL (ref 3.80–5.10)
RDW: 12.7 % (ref 11.0–15.0)
Total Lymphocyte: 29.3 %
WBC: 8.7 10*3/uL (ref 3.8–10.8)

## 2019-02-07 LAB — LIPID PANEL
Cholesterol: 147 mg/dL (ref <200)
HDL: 36 mg/dL — ABNORMAL LOW (ref >=50)
LDL CHOLESTEROL (CALC): 88 mg/dL
Non-HDL Cholesterol (Calc): 111 mg/dL (calc) (ref <130)
Total CHOL/HDL Ratio: 4.1 (calc) (ref <5.0)
Triglycerides: 131 mg/dL (ref <150)

## 2019-02-07 LAB — HEMOGLOBIN A1C
Hgb A1c MFr Bld: 5.8 % of total Hgb — ABNORMAL HIGH (ref <5.7)
MEAN PLASMA GLUCOSE: 120 (calc)
eAG (mmol/L): 6.6 (calc)

## 2019-02-07 LAB — MAGNESIUM: Magnesium: 2 mg/dL (ref 1.5–2.5)

## 2019-02-07 LAB — TSH: TSH: 1.26 mIU/L (ref 0.40–4.50)

## 2019-02-07 LAB — VITAMIN D 25 HYDROXY (VIT D DEFICIENCY, FRACTURES): Vit D, 25-Hydroxy: 63 ng/mL (ref 30–100)

## 2019-02-07 NOTE — Progress Notes (Signed)
Has been printed & placed in [place] [facility]

## 2019-04-10 ENCOUNTER — Other Ambulatory Visit: Payer: Self-pay

## 2019-04-10 DIAGNOSIS — T7840XA Allergy, unspecified, initial encounter: Secondary | ICD-10-CM

## 2019-04-10 DIAGNOSIS — E785 Hyperlipidemia, unspecified: Secondary | ICD-10-CM

## 2019-04-10 MED ORDER — SIMVASTATIN 40 MG PO TABS: ORAL_TABLET | ORAL | 1 refills | Status: DC

## 2019-04-10 MED ORDER — LORATADINE 10 MG PO TABS: ORAL_TABLET | ORAL | 1 refills | Status: DC

## 2019-04-10 MED ORDER — TELMISARTAN 80 MG PO TABS: ORAL_TABLET | ORAL | 1 refills | Status: DC

## 2019-04-25 ENCOUNTER — Telehealth (INDEPENDENT_AMBULATORY_CARE_PROVIDER_SITE_OTHER): Admitting: Physician Assistant

## 2019-04-25 DIAGNOSIS — J4521 Mild intermittent asthma with (acute) exacerbation: Secondary | ICD-10-CM

## 2019-04-25 MED ORDER — DOXYCYCLINE HYCLATE 100 MG PO CAPS: ORAL_CAPSULE | ORAL | 0 refills | Status: DC

## 2019-04-25 MED ORDER — FLUTICASONE-SALMETEROL 115-21 MCG/ACT IN AERO: 2.0000 | INHALATION_SPRAY | Freq: Two times a day (BID) | RESPIRATORY_TRACT | 12 refills | Status: DC

## 2019-04-25 MED ORDER — ALBUTEROL SULFATE HFA 108 (90 BASE) MCG/ACT IN AERS: 2.0000 | INHALATION_SPRAY | RESPIRATORY_TRACT | 0 refills | Status: DC | PRN

## 2019-04-25 NOTE — Telephone Encounter (Signed)
THIS ENCOUNTER IS A VIRTUAL VISIT DUE TO COVID-19 - PATIENT WAS NOT SEEN IN THE OFFICE.  PATIENT HAS CONSENTED TO VIRTUAL VISIT / TELEMEDICINE VISIT   Virtual Visit via telephone Note  I connected with Sonya Peterson on5/19/2020  by telephone.  I verified that I am speaking with the correct person using two identifiers.    I discussed the limitations of evaluation and management by telemedicine and the availability of in person appointments. The patient expressed understanding and agreed to proceed.  History of Present Illness: 63 y.o. obese WF with history of asthma calls with asthma symptoms x weeks.  She is on Singulair, claritin. She has had wet cough, wheezing, no fever, chills. She does not have a rescue inhaler at this time.   No SOB, myalgias, loss of sense of smell.   Medications   Current Outpatient Medications (Cardiovascular):  .  simvastatin (ZOCOR) 40 MG tablet, TAKE 1 TABLET AT BEDTIME .  telmisartan (MICARDIS) 80 MG tablet, Take 1/2-1 tab daily for BP goal < 130/80.  Current Outpatient Medications (Respiratory):  .  loratadine (CLARITIN) 10 MG tablet, TAKE 1 TABLET DAILY FOR ALLERGIES .  montelukast (SINGULAIR) 10 MG tablet, Take 1 tablet (10 mg total) by mouth daily.  Current Outpatient Medications (Analgesics):  .  meloxicam (MOBIC) 7.5 MG tablet, Take 1 tablet (7.5 mg total) daily by mouth.   Current Outpatient Medications (Other):  Marland Kitchen  Cholecalciferol (VITAMIN D3) 5000 units TABS, Take 5,000 Units by mouth at bedtime. Marland Kitchen  FLUoxetine (PROZAC) 20 MG capsule, TAKE 3 CAPSULES DAILY FOR MOOD  Problem list She has Hyperlipidemia; Hypertension; Allergy; Vitamin D deficiency; Depression; Infection of Achilles tendon; Obesity (BMI 30.0-34.9); and Abnormal glucose on their problem list.   Observations/Objective: General Appearance:Well sounding, in no apparent distress.  ENT/Mouth: No hoarseness Respiratory: completing full sentences without distress, without  audible wheeze, + wet cough without SOB Neuro: Awake and oriented X 3,  Psych:  Insight and Judgment appropriate.    Assessment and Plan:  Mild intermittent asthma with acute exacerbation -     fluticasone-salmeterol (ADVAIR HFA) 115-21 MCG/ACT inhaler; Inhale 2 puffs into the lungs 2 (two) times daily. -     albuterol (VENTOLIN HFA) 108 (90 Base) MCG/ACT inhaler; Inhale 2 puffs into the lungs every 4 (four) hours as needed for wheezing or shortness of breath. -     doxycycline (VIBRAMYCIN) 100 MG capsule; Take 1 capsule twice daily with food  Will hold the Doxy and take if she is not getting better or gets worse, increase fluids, rest, cont allergy pill  No COVID exposure, no other symptoms Will still self quarantine  Go to the ER if any chest pain, shortness of breath, nausea, dizziness, severe HA, changes vision/speech   Follow Up Instructions:    I discussed the assessment and treatment plan with the patient. The patient was provided an opportunity to ask questions and all were answered. The patient agreed with the plan and demonstrated an understanding of the instructions.   The patient was advised to call back or seek an in-person evaluation if the symptoms worsen or if the condition fails to improve as anticipated.  I provided 15 minutes of non-face-to-face time during this encounter.   Vicie Mutters, PA-C

## 2019-04-25 NOTE — Telephone Encounter (Signed)
-----   Message from Sonya Peterson, [place] sent at 04/25/2019  9:16 AM EDT ----- Regarding: sxs Contact: [phone] Patient is taking [place] for asthma.  She has some chest congestion & little wheezing, wants to know if we can give her something else for her asthma .  PER OFFICE NOTE.  Please & thank you!  Pharmacy: [facility]

## 2019-06-08 NOTE — Progress Notes (Signed)
Assessment and Plan:  Asthma Start advair daily and 2 x a day Dexamethasone shot in the office Albuterol as needed Get CXR Go to the ER if any chest pain, shortness of breath, nausea, dizziness, severe HA, changes vision/speech  Hypertension  -Continue medication, get a monitor and start to check blood pressure at home, if continue to be above 140/90 call the office and make appointment. Continue DASH diet.  Reminder to go to the ER if any CP, SOB, nausea, dizziness, severe HA, changes vision/speech, left arm numbness and tingling, and jaw pain.  Cholesterol  -Continue diet and exercise. Check cholesterol.   Abnormal glucose -Continue diet and exercise. Check A1C   Vitamin D Def - check level and continue medications.    Obesity with co morbidities - long discussion about weight loss, diet, and exercise  Continue diet and meds as discussed. Further disposition pending results of labs. Future Appointments  Date Time Provider [place]  10/30/2019  2:00 PM Vicie Mutters, PA-C [id number] None    HPI 63 y.o. female  presents for 3 month follow up with hypertension, hyperlipidemia, prediabetes and vitamin D. She has history of allergies, asthma, on allergy pill. Has congestion morning and at night, occ productive, white. Some wheezing, SOB. She is not on the advair regularly. Has been x May 19th.  CXR 2018 Her blood pressure has not been checked at home, today their BP is BP: 122/84  BP Readings from Last 5 Encounters:  06/12/19 122/84  02/06/19 120/88  10/24/18 (!) 142/84  07/25/18 (!) 142/80  01/24/18 (!) 136/92   She does not workout.  She denies chest pain, shortness of breath, dizziness.  She is on cholesterol medication.  zocor 40mg   and denies myalgias. Her cholesterol is at goal. The cholesterol last visit was:   Lab Results  Component Value Date   CHOL 147 02/06/2019   HDL 36 (L) 02/06/2019   LDLCALC 88 02/06/2019   TRIG 131 02/06/2019   CHOLHDL 4.1  02/06/2019   She has been working on diet and exercise for prediabetes, and denies paresthesia of the feet, polydipsia, polyuria and visual disturbances. Last A1C in the office was:  Lab Results  Component Value Date   HGBA1C 5.8 (H) 02/06/2019   Patient is on Vitamin D supplement.   Lab Results  Component Value Date   VD25OH 63 02/06/2019     BMI is Body mass index is 34.08 kg/m., she is struggling with weight loss due to time limitations. Has been continuing to work.  Wt Readings from Last 3 Encounters:  06/12/19 204 lb 12.8 oz (92.9 kg)  02/06/19 206 lb 12.8 oz (93.8 kg)  10/24/18 203 lb (92.1 kg)    Current Medications:  Current Outpatient Medications on File Prior to Visit  Medication Sig Dispense Refill  . albuterol (VENTOLIN HFA) 108 (90 Base) MCG/ACT inhaler Inhale 2 puffs into the lungs every 4 (four) hours as needed for wheezing or shortness of breath. 1 Inhaler 0  . Cholecalciferol (VITAMIN D3) 5000 units TABS Take 5,000 Units by mouth at bedtime.    Marland Kitchen FLUoxetine (PROZAC) 20 MG capsule TAKE 3 CAPSULES DAILY FOR MOOD 270 capsule 1  . fluticasone-salmeterol (ADVAIR HFA) 115-21 MCG/ACT inhaler Inhale 2 puffs into the lungs 2 (two) times daily. 1 Inhaler 12  . loratadine (CLARITIN) 10 MG tablet TAKE 1 TABLET DAILY FOR ALLERGIES 90 tablet 1  . meloxicam (MOBIC) 7.5 MG tablet Take 1 tablet (7.5 mg total) daily by mouth. 90 tablet  1  . montelukast (SINGULAIR) 10 MG tablet Take 1 tablet (10 mg total) by mouth daily. 90 tablet 2  . simvastatin (ZOCOR) 40 MG tablet TAKE 1 TABLET AT BEDTIME 90 tablet 1  . telmisartan (MICARDIS) 80 MG tablet Take 1/2-1 tab daily for BP goal < 130/80. 90 tablet 1   No current facility-administered medications on file prior to visit.    Medical History:  Past Medical History:  Diagnosis Date  . Allergy   . Arthritis   . Bronchitis   . Depression    OCD  . Hyperlipidemia   . Obesity (BMI 30.0-34.9)   . Prediabetes 11/09/2013  . Vitamin D  deficiency    Allergies: No Known Allergies   Review of Systems  Constitutional: Negative for chills, diaphoresis, fever, malaise/fatigue and weight loss.  HENT: Negative.   Eyes: Negative.   Respiratory: Positive for cough, sputum production and wheezing. Negative for hemoptysis and shortness of breath.   Cardiovascular: Negative.   Gastrointestinal: Negative.   Genitourinary: Negative.   Musculoskeletal: Negative.   Skin: Negative.   Neurological: Negative.  Negative for weakness.  Endo/Heme/Allergies: Negative.   Psychiatric/Behavioral: Negative.     Family history- Review and unchanged Social history- Review and unchanged Physical Exam: BP 122/84   Pulse 70   Temp 98.4 F (36.9 C)   Ht 5\' 5"  (1.651 m)   Wt 204 lb 12.8 oz (92.9 kg)   SpO2 94%   BMI 34.08 kg/m  Wt Readings from Last 3 Encounters:  06/12/19 204 lb 12.8 oz (92.9 kg)  02/06/19 206 lb 12.8 oz (93.8 kg)  10/24/18 203 lb (92.1 kg)   General Appearance: Well nourished, in no apparent distress. Eyes: PERRLA, EOMs, conjunctiva no swelling or erythema Sinuses: No Frontal/maxillary tenderness ENT/Mouth: Ext aud canals clear, TMs without erythema, bulging. No erythema, swelling, or exudate on post pharynx.  Tonsils not swollen or erythematous. Hearing normal.  Neck: Supple, thyroid normal.  Respiratory: Respiratory effort normal, BS equal bilaterally with wheezing without rales, rhonchi, or stridor.  Cardio: RRR with no MRGs. Brisk peripheral pulses without edema.  Abdomen: Soft, + BS.  Non tender, no guarding, rebound, hernias, masses. Lymphatics: Non tender without lymphadenopathy.  Musculoskeletal: Full ROM, 5/5 strength, normal gait.  Skin:  Warm, dry without rashes, lesions, ecchymosis.  Neuro: Cranial nerves intact. Normal muscle tone, no cerebellar symptoms. Sensation intact.  Psych: Awake and oriented X 3, normal affect, Insight and Judgment appropriate.    Vicie Mutters, PA-C 2:59 PM [facility]  Adult & Adolescent Internal Medicine

## 2019-06-12 ENCOUNTER — Other Ambulatory Visit: Payer: Self-pay

## 2019-06-12 ENCOUNTER — Ambulatory Visit (INDEPENDENT_AMBULATORY_CARE_PROVIDER_SITE_OTHER): Admitting: Physician Assistant

## 2019-06-12 ENCOUNTER — Encounter: Payer: Self-pay | Admitting: Physician Assistant

## 2019-06-12 VITALS — BP 122/84 | HR 70 | Temp 98.4°F | Ht 65.0 in | Wt 204.8 lb

## 2019-06-12 DIAGNOSIS — I10 Essential (primary) hypertension: Secondary | ICD-10-CM

## 2019-06-12 DIAGNOSIS — E669 Obesity, unspecified: Secondary | ICD-10-CM

## 2019-06-12 DIAGNOSIS — R7309 Other abnormal glucose: Secondary | ICD-10-CM | POA: Diagnosis not present

## 2019-06-12 DIAGNOSIS — Z79899 Other long term (current) drug therapy: Secondary | ICD-10-CM

## 2019-06-12 DIAGNOSIS — E785 Hyperlipidemia, unspecified: Secondary | ICD-10-CM | POA: Diagnosis not present

## 2019-06-12 DIAGNOSIS — E559 Vitamin D deficiency, unspecified: Secondary | ICD-10-CM | POA: Diagnosis not present

## 2019-06-12 DIAGNOSIS — F3341 Major depressive disorder, recurrent, in partial remission: Secondary | ICD-10-CM

## 2019-06-12 DIAGNOSIS — E611 Iron deficiency: Secondary | ICD-10-CM

## 2019-06-12 DIAGNOSIS — J4521 Mild intermittent asthma with (acute) exacerbation: Secondary | ICD-10-CM

## 2019-06-12 MED ORDER — ADVAIR HFA 115-21 MCG/ACT IN AERO: 2.0000 | INHALATION_SPRAY | Freq: Two times a day (BID) | RESPIRATORY_TRACT | 12 refills | Status: DC

## 2019-06-12 MED ORDER — DEXAMETHASONE SODIUM PHOSPHATE 100 MG/10ML IJ SOLN: 10.0000 mg | Freq: Once | INTRAMUSCULAR | Status: DC

## 2019-06-12 NOTE — Patient Instructions (Signed)
INFORMATION ABOUT YOUR XRAY  Can walk into [address] for an Insurance account manager. They will have the order and take you back. You do not any paper work, I should get the result back today or tomorrow. This order is good for a year.  Can call [phone] to schedule an appointment if you wish.   INFORMATION ABOUT YOUR STEROID INHALER  Can do steroid inhaler, NEED TO DO DAILY, this is NOT a rescue inhaler so if you are acutely short of breath please use your albuterol or call 911.  Do 1 puff twice a day.  Do before you brush your teeth OR wash your mouth afterwards.  IF YOU DO NOT Lyon YOUR MOUTH OUT IT CAN CAUSE YEAST Can do 2 tsp vinegar with water and switch to help prevent yeast or help yeast in your mouth.   Go to the ER if any chest pain, shortness of breath, nausea, dizziness, severe HA, changes vision/speech    Asthma Attack  Acute bronchospasm caused by asthma is also referred to as an asthma attack. Bronchospasm means that the air passages become narrowed or "tight," which limits the amount of oxygen that can get into the lungs. The narrowing is caused by inflammation and tightening of the muscles in the air tubes (bronchi) in the lungs. Excessive mucus is also produced, which narrows the airways more. This can cause trouble breathing, coughing, and loud breathing (wheezing). What are the causes? Possible triggers include:  Animal dander from the skin, hair, or feathers of animals.  Dust mites contained in house dust.  Cockroaches.  Pollen from trees or grass.  Mold.  Cigarette or tobacco smoke.  Air pollutants such as dust, household cleaners, hair sprays, aerosol sprays, paint fumes, strong chemicals, or strong odors.  Cold air or weather changes. Cold air may trigger inflammation. Winds increase molds and pollens in the air.  Strong emotions such as crying or laughing hard.  Stress.  Certain medicines, such as aspirin or beta-blockers.  Sulfites in foods and  drinks, such as dried fruits and wine.  Infections or inflammatory conditions, such as a flu, a cold, pneumonia, or inflammation of the nasal membranes (rhinitis).  Gastroesophageal reflux disease (GERD). GERD is a condition in which stomach acid backs up into your esophagus, which can irritate nearby airway structures.  Exercise or activity that requires a lot of energy. What are the signs or symptoms? Symptoms of this condition include:  Wheezing. This may sound like whistling while breathing. This may be more noticeable at night.  Excessive coughing, particularly at night.  Chest tightness or pain.  Shortness of breath.  Feeling like you cannot get enough air no matter how hard you try (air hunger). How is this diagnosed? This condition may be diagnosed based on:  Your medical history.  Your symptoms.  A physical exam.  Tests to check for other causes of your symptoms or other conditions that may have triggered your asthma attack. These tests may include: ? Chest X-ray. ? Blood tests. ? Specialized tests to assess lung function, such as breathing into a device that measures how much air you inhale and exhale (spirometry). How is this treated? The goal of treatment is to open the airways in your lungs and reduce inflammation. Most asthma attacks are treated with medicines that you inhale through a hand-held inhaler (metered dose inhaler, MDI) or a device that turns liquid medicine into a mist that you inhale (nebulizer). Medicines may include:  Quick relief or rescue medicines  that relax the muscles of the bronchi. These medicines include bronchodilators, such as albuterol.  Controller medicines, such as inhaled corticosteroids. These are long-acting medicines that are used for daily asthma maintenance. If you have a moderate or severe asthma attack, you may be treated with steroid medicines by mouth or through an IV injection at the hospital. Steroid medicines reduce  inflammation in your lungs. Depending on the severity of your attack, you may need oxygen therapy to help you breathe. If your asthma attack was caused by a bacterial infection, such as pneumonia, you will be given antibiotic medicines. Follow these instructions at home: Medicines  Take over-the-counter and prescription medicines only as told by your health care provider. Keep your medicines up-to-date and available.  If you are more than [redacted] weeks pregnant and you are prescribed any new medicines, tell your obstetrician about those medicines.  If you were prescribed an antibiotic medicine, take it as told by your health care provider. Do not stop taking the antibiotic even if you start to feel better. Avoiding triggers   Keep track of things that trigger your asthma attacks or cause you to have breathing problems, and avoid exposure to these triggers.  Do not use any products that contain nicotine or tobacco, such as cigarettes and e-cigarettes. If you need help quitting, ask your health care provider.  Avoid secondhand smoke.  Avoid strong smells, such as perfumes, aerosols, and cleaning solvents.  When pollen or air pollution is bad, keep windows closed and use an air conditioner or go to places with air conditioning. Asthma action plan  Work with your health care provider to make a written plan for managing and treating your asthma attacks (asthma action plan). This plan should include: ? A list of your asthma triggers and how to avoid them. ? Information about when your medicines should be taken and when their dosage should be changed. ? Instructions about using a device called a peak flow meter to monitor your condition. A peak flow meter measures how well your lungs are working and measures how severe your asthma is at a given time. Your "personal best" is the highest peak flow rate you can reach when you feel good and have no asthma symptoms. General instructions  Avoid excessive  exercise or activity until your asthma attack resolves. Ask your health care provider what activities are safe for you and when you can return to your normal activities.  Stay up to date on all vaccinations recommended by your health care provider, such as flu and pneumonia vaccines.  Drink enough fluid to keep your urine clear or pale yellow. Staying hydrated helps keep mucus in your lungs thin so it can be coughed up easily.  If you drink caffeine, do so in moderation.  Do not use alcohol until you have recovered.  Keep all follow-up visits as told by your health care provider. This is important. Asthma requires careful medical care, and you and your health care provider can work together to reduce the likelihood of future attacks. Contact a health care provider if:  Your peak flow reading is still at 50-79% of your personal best after you have followed your action plan for 1 hour. This is in the yellow zone, which means "caution."  You need to use a reliever medicine more than 2-3 times a week.  Your medicines are causing side effects, such as: ? Rash. ? Itching. ? Swelling. ? Trouble breathing.  Your symptoms do not improve after 48 hours.  You cough up mucus (sputum) that is thicker than usual.  You have a fever.  You need to use your medicines much more frequently than normal. Get help right away if:  Your peak flow reading is less than 50% of your personal best. This is in the red zone, which means "danger."  You have severe trouble breathing.  You develop chest pain or discomfort.  Your medicines no longer seem to be helping.  You vomit.  You cannot eat or drink without vomiting.  You are coughing up yellow, green, brown, or bloody mucus.  You have a fever and your symptoms suddenly get worse.  You have trouble swallowing.  You feel very tired, and breathing becomes tiring. Summary  Acute bronchospasm caused by asthma is also referred to as an asthma  attack.  Bronchospasm is caused by narrowing or tightness in air passages, which causes shortness of breath, coughing, and loud breathing (wheezing).  Many things can trigger an asthma attack, such as allergens, weather changes, exercise, smoke, and other fumes.  Treatment for an asthma attack may include inhaled rescue medicines for immediate relief, as well as the use of maintenance therapy.  Get help right away if you have worsening shortness of breath, chest pain, or fever, or if your home medicines are no longer helping with your symptoms. This information is not intended to replace advice given to you by your health care provider. Make sure you discuss any questions you have with your health care provider. Document Released: 03/10/2007 Document Revised: 03/14/2019 Document Reviewed: 12/25/2016 Elsevier Patient Education  2020 [employer].

## 2019-06-13 ENCOUNTER — Ambulatory Visit
Admission: RE | Admit: 2019-06-13 | Discharge: 2019-06-13 | Disposition: A | Discharge status: Home / Self Care | Source: Ambulatory Visit | Attending: Physician Assistant | Admitting: Physician Assistant

## 2019-06-13 DIAGNOSIS — J4521 Mild intermittent asthma with (acute) exacerbation: Secondary | ICD-10-CM

## 2019-06-13 LAB — COMPLETE METABOLIC PANEL WITH GFR
AG Ratio: 1.7 (calc) (ref 1.0–2.5)
ALT: 13 U/L (ref 6–29)
AST: 13 U/L (ref 10–35)
Albumin: 4.5 g/dL (ref 3.6–5.1)
Alkaline phosphatase (APISO): 94 U/L (ref 37–153)
BUN: 17 mg/dL (ref 7–25)
CO2: 26 mmol/L (ref 20–32)
Calcium: 9.4 mg/dL (ref 8.6–10.4)
Chloride: 105 mmol/L (ref 98–110)
Creat: 0.6 mg/dL (ref 0.50–0.99)
GFR, Est African American: 115 mL/min/{1.73_m2} (ref >=60)
GFR, Est Non African American: 99 mL/min/{1.73_m2} (ref >=60)
Globulin: 2.7 g/dL (calc) (ref 1.9–3.7)
Glucose, Bld: 89 mg/dL (ref 65–99)
Potassium: 4.1 mmol/L (ref 3.5–5.3)
Sodium: 139 mmol/L (ref 135–146)
Total Bilirubin: 0.3 mg/dL (ref 0.2–1.2)
Total Protein: 7.2 g/dL (ref 6.1–8.1)

## 2019-06-13 LAB — LIPID PANEL
Cholesterol: 138 mg/dL (ref <200)
HDL: 37 mg/dL — ABNORMAL LOW (ref >=50)
LDL Cholesterol (Calc): 81 mg/dL (calc)
Non-HDL Cholesterol (Calc): 101 mg/dL (calc) (ref <130)
Total CHOL/HDL Ratio: 3.7 (calc) (ref <5.0)
Triglycerides: 108 mg/dL (ref <150)

## 2019-06-13 LAB — CBC WITH DIFFERENTIAL/PLATELET
Absolute Monocytes: 546 cells/uL (ref 200–950)
Basophils Absolute: 73 cells/uL (ref 0–200)
Basophils Relative: 0.8 %
Eosinophils Absolute: 619 cells/uL — ABNORMAL HIGH (ref 15–500)
Eosinophils Relative: 6.8 %
HCT: 43.2 % (ref 35.0–45.0)
Hemoglobin: 14.6 g/dL (ref 11.7–15.5)
Lymphs Abs: 2393 cells/uL (ref 850–3900)
MCH: 29.9 pg (ref 27.0–33.0)
MCHC: 33.8 g/dL (ref 32.0–36.0)
MCV: 88.3 fL (ref 80.0–100.0)
MPV: 9.7 fL (ref 7.5–12.5)
Monocytes Relative: 6 %
Neutro Abs: 5469 cells/uL (ref 1500–7800)
Neutrophils Relative %: 60.1 %
Platelets: 331 10*3/uL (ref 140–400)
RBC: 4.89 10*6/uL (ref 3.80–5.10)
RDW: 12.7 % (ref 11.0–15.0)
Total Lymphocyte: 26.3 %
WBC: 9.1 10*3/uL (ref 3.8–10.8)

## 2019-06-13 LAB — IRON, TOTAL/TOTAL IRON BINDING CAP
%SAT: 17 % (calc) (ref 16–45)
Iron: 54 ug/dL (ref 45–160)
TIBC: 320 mcg/dL (calc) (ref 250–450)

## 2019-06-13 LAB — FERRITIN: Ferritin: 158 ng/mL (ref 16–232)

## 2019-06-13 LAB — TSH: TSH: 0.71 mIU/L (ref 0.40–4.50)

## 2019-06-13 LAB — HEMOGLOBIN A1C
Hgb A1c MFr Bld: 5.9 % of total Hgb — ABNORMAL HIGH (ref <5.7)
Mean Plasma Glucose: 123 (calc)
eAG (mmol/L): 6.8 (calc)

## 2019-06-13 LAB — VITAMIN D 25 HYDROXY (VIT D DEFICIENCY, FRACTURES): Vit D, 25-Hydroxy: 69 ng/mL (ref 30–100)

## 2019-06-13 LAB — MAGNESIUM: Magnesium: 2.1 mg/dL (ref 1.5–2.5)

## 2019-06-14 ENCOUNTER — Other Ambulatory Visit: Payer: Self-pay | Admitting: Physician Assistant

## 2019-06-14 DIAGNOSIS — F3341 Major depressive disorder, recurrent, in partial remission: Secondary | ICD-10-CM

## 2019-08-15 ENCOUNTER — Other Ambulatory Visit: Payer: Self-pay

## 2019-08-15 DIAGNOSIS — T7840XA Allergy, unspecified, initial encounter: Secondary | ICD-10-CM

## 2019-08-15 MED ORDER — MONTELUKAST SODIUM 10 MG PO TABS: 10.0000 mg | ORAL_TABLET | Freq: Every day | ORAL | 2 refills | Status: DC

## 2019-09-13 ENCOUNTER — Ambulatory Visit (INDEPENDENT_AMBULATORY_CARE_PROVIDER_SITE_OTHER)

## 2019-09-13 ENCOUNTER — Other Ambulatory Visit: Payer: Self-pay

## 2019-09-13 VITALS — Temp 97.3°F

## 2019-09-13 DIAGNOSIS — Z23 Encounter for immunization: Secondary | ICD-10-CM | POA: Diagnosis not present

## 2019-09-13 NOTE — Progress Notes (Signed)
PRESENTS FOR LOW DOSE FLU

## 2019-10-07 ENCOUNTER — Other Ambulatory Visit: Payer: Self-pay | Admitting: Physician Assistant

## 2019-10-18 ENCOUNTER — Other Ambulatory Visit: Payer: Self-pay | Admitting: Urology

## 2019-10-19 ENCOUNTER — Encounter (HOSPITAL_COMMUNITY): Payer: Self-pay | Admitting: General Practice

## 2019-10-20 NOTE — H&P (Signed)
Office Visit Report     10/17/2019   --------------------------------------------------------------------------------   Sonya Peterson  MRN: [id number]  DOB: June 01, 1956, 63 year old Female  SSN: [id number]   PRIMARY CARE:  Unk Pinto, MD  REFERRING:  Unk Pinto, MD  PROVIDER:  Kathie Rhodes, M.D.  LOCATION:  [facility] [phone]     --------------------------------------------------------------------------------   CC: I have kidney stones.  HPI: Sonya Peterson is a 63 year-old female established patient who is here for renal calculi.  The problem is on the left side. Her symptoms include flank pain and nausea. Patient denies having back pain, groin pain, vomiting, fever, chills, and blood in urine. This is not her first kidney stone. She has had 1 stones prior to getting this one. She is currently having flank pain. She denies having back pain, groin pain, nausea, vomiting, fever, and chills. She has not caught a stone in her urine strainer since her symptoms began.   She has never had surgical treatment for calculi in the past. This condition would be considered of mild to moderate severity with no modifying factors or associated signs or symptoms other than as noted above.   10/17/19: She has a known left renal calculus and has recently begun to have constant pain in her left flank. This is not associated with any hematuria. She has not having any vomiting but has had some mild nausea. Pain is not relieved by positional change.     ALLERGIES: None   MEDICATIONS: Claritin  Micardis  Prozac  Singulair  Vitamin D2  Zocor     GU PSH: None   NON-GU PSH: Back surgery Bilateral Tubal Ligation Foot surgery (unspecified)     GU PMH: Renal calculus, Left, She was given information on stone prevention and will return in 6 months for a KUB to assure stability of her left renal calculi. - 02/27/2019    NON-GU PMH: Arthritis Asthma Hepatitis  C Hypercholesterolemia Hypertension    FAMILY HISTORY: 1 Daughter - Runs in Family   SOCIAL HISTORY: Marital Status: Married Ethnicity: Not Hispanic Or Latino; Race: White Current Smoking Status: Patient has never smoked.   Tobacco Use Assessment Completed: Used Tobacco in last 30 days? Does drink.  Drinks 2 caffeinated drinks per day.    REVIEW OF SYSTEMS:    GU Review Female:   Patient denies frequent urination, hard to postpone urination, burning /pain with urination, get up at night to urinate, leakage of urine, stream starts and stops, trouble starting your stream, have to strain to urinate, and being pregnant.  Gastrointestinal (Upper):   Patient denies nausea, vomiting, and indigestion/ heartburn.  Gastrointestinal (Lower):   Patient denies diarrhea and constipation.  Constitutional:   Patient denies fever, night sweats, weight loss, and fatigue.  Skin:   Patient denies itching and skin rash/ lesion.  Eyes:   Patient denies blurred vision and double vision.  Ears/ Nose/ Throat:   Patient denies sore throat and sinus problems.  Hematologic/Lymphatic:   Patient denies swollen glands and easy bruising.  Cardiovascular:   Patient denies leg swelling and chest pains.  Respiratory:   Patient denies cough and shortness of breath.  Endocrine:   Patient denies excessive thirst.  Musculoskeletal:   Patient denies back pain and joint pain.  Neurological:   Patient denies headaches and dizziness.  Psychologic:   Patient denies depression and anxiety.   VITAL SIGNS:      10/17/2019 10:55 AM  Weight 195 lb / 88.45  kg  Height 66.5 in / 168.91 cm  BP 145/91 mmHg  Pulse 81 /min  BMI 31.0 kg/m   PAST DATA REVIEWED:  Source Of History:  Patient  Records Review:   Previous Patient Records, POC Tool  X-Ray Review: KUB: Reviewed Films. Previous KUB images were reviewed and compared with today's study.    PROCEDURES:         KUB - [id number]  A single view of the abdomen is obtained.       Patient confirmed No Neulasta OnPro Device. Independent review of her KUB today reveals that the stone that was previously located in the lower pole of her left kidney is now located at the UPJ. It measures 12 mm long by 6 mm wide.         Urinalysis w/Scope Dipstick Dipstick Cont'd Micro  Color: Yellow Bilirubin: Neg mg/dL WBC/hpf: 0 - 5/hpf  Appearance: Cloudy Ketones: Neg mg/dL RBC/hpf: 0 - 2/hpf  Specific Gravity: 1.030 Blood: Neg ery/uL Bacteria: Few (10-25/hpf)  pH: 5.5 Protein: 1+ mg/dL Cystals: NS (Not Seen)  Glucose: Neg mg/dL Urobilinogen: 0.2 mg/dL Casts: NS (Not Seen)    Nitrites: Neg Trichomonas: Not Present    Leukocyte Esterase: 2+ leu/uL Mucous: Not Present      Epithelial Cells: 0 - 5/hpf      Yeast: NS (Not Seen)      Sperm: Not Present    Notes: qns to spin    ASSESSMENT:      ICD-10 Details  1 GU:   Ureteral calculus - N20.1 Left, She has elected to proceed with lithotripsy. Her stone has Hounsfield units of 800.   2   Renal calculus - N20.0 Left, Her left lower pole calculus has now moved and is located at the UPJ.  3   Flank Pain - R10.84 Left, Her stone has caused some pain and therefore I have started her on tamsulosin and also given a prescription for pain medication.              Notes:   We discussed the management of urinary stones. These options include observation, ureteroscopy, shockwave lithotripsy, and PCNL. We discussed which options are relevant to these particular stones. We discussed the natural history of stones as well as the complications of untreated stones and the impact on quality of life without treatment as well as with each of the above listed treatments. We also discussed the efficacy of each treatment in its ability to clear the stone burden. With any of these management options I discussed the signs and symptoms of infection and the need for emergent treatment should these be experienced. For each option we discussed the ability of each  procedure to clear the patient of their stone burden.   For observation I described the risks which include but are not limited to silent renal damage, life-threatening infection, need for emergent surgery, failure to pass stone, and pain.   For ureteroscopy I described the risks which include heart attack, stroke, pulmonary embolus, death, bleeding, infection, damage to contiguous structures, positioning injury, ureteral stricture, ureteral avulsion, ureteral injury, need for ureteral stent, inability to perform ureteroscopy, need for an interval procedure, inability to clear stone burden, stent discomfort and pain.   For shockwave lithotripsy I described the risks which include arrhythmia, kidney contusion, kidney hemorrhage, need for transfusion, long-term risk of diabetes or hypertension, back discomfort, flank ecchymosis, flank abrasion, inability to break up stone, inability to pass stone fragments, Steinstrasse, infection associated with obstructing  stones, need for different surgical procedure, need for repeat shockwave lithotripsy, and death.   For PCNL I described the risks including heart attack, sure, pulmonary embolus, death, positioning injury, pneumothorax, hydrothorax, need for chest tube, inability to clear stone burden, renal laceration, arterial venous fistula or malformation, need for embolization of kidney, loss of kidney or renal function, need for repeat procedure, need for prolonged nephrostomy tube, ureteral avulsion, fistula.   I have marked her KUB but unfortunately an arrow is not an option in the viewer that comes up for the office two view films so I have used the angle lines to indicate the location of the stone.    PLAN:            Medications New Meds: Hydrocodone-Acetaminophen 10 mg-325 mg tablet 1 tablet PO Q 4 H   #20  0 Refill(s)  Tamsulosin Hcl 0.4 mg capsule 1 capsule PO Q PM   #30  0 Refill(s)            Orders X-Rays: KUB          Schedule Return  Visit/Planned Activity: Next Available Appointment - Schedule Surgery          Document Letter(s):  Created for Patient: Clinical Summary    * Signed by Kathie Rhodes, M.D. on 10/17/19 at 11:17 AM (EST)*     The information contained in this medical record document is considered private and confidential patient information. This information can only be used for the medical diagnosis and/or medical services that are being provided by the patient's selected caregivers. This information can only be distributed outside of the patient's care if the patient agrees and signs waivers of authorization for this information to be sent to an outside source or route.

## 2019-10-23 ENCOUNTER — Ambulatory Visit (HOSPITAL_COMMUNITY)

## 2019-10-23 ENCOUNTER — Ambulatory Visit (HOSPITAL_COMMUNITY)
Admission: RE | Admit: 2019-10-23 | Discharge: 2019-10-23 | Disposition: A | Discharge status: Home / Self Care | Source: Ambulatory Visit | Attending: Urology | Admitting: Urology

## 2019-10-23 ENCOUNTER — Encounter (HOSPITAL_COMMUNITY)
Admission: RE | Disposition: A | Discharge status: Home / Self Care | Payer: Self-pay | Source: Ambulatory Visit | Attending: Urology

## 2019-10-23 ENCOUNTER — Encounter (HOSPITAL_COMMUNITY): Payer: Self-pay | Admitting: General Practice

## 2019-10-23 DIAGNOSIS — E7800 Pure hypercholesterolemia, unspecified: Secondary | ICD-10-CM | POA: Insufficient documentation

## 2019-10-23 DIAGNOSIS — Z79899 Other long term (current) drug therapy: Secondary | ICD-10-CM | POA: Diagnosis not present

## 2019-10-23 DIAGNOSIS — M1990 Unspecified osteoarthritis, unspecified site: Secondary | ICD-10-CM | POA: Insufficient documentation

## 2019-10-23 DIAGNOSIS — N202 Calculus of kidney with calculus of ureter: Secondary | ICD-10-CM | POA: Diagnosis not present

## 2019-10-23 DIAGNOSIS — I10 Essential (primary) hypertension: Secondary | ICD-10-CM | POA: Diagnosis not present

## 2019-10-23 DIAGNOSIS — J45909 Unspecified asthma, uncomplicated: Secondary | ICD-10-CM | POA: Insufficient documentation

## 2019-10-23 DIAGNOSIS — N135 Crossing vessel and stricture of ureter without hydronephrosis: Secondary | ICD-10-CM

## 2019-10-23 DIAGNOSIS — N201 Calculus of ureter: Secondary | ICD-10-CM | POA: Diagnosis present

## 2019-10-23 HISTORY — DX: Personal history of urinary calculi: Z87.442

## 2019-10-23 HISTORY — PX: EXTRACORPOREAL SHOCK WAVE LITHOTRIPSY: SHX1557

## 2019-10-23 SURGERY — LITHOTRIPSY, ESWL
Anesthesia: LOCAL | Laterality: Left

## 2019-10-23 MED ORDER — MELOXICAM 7.5 MG PO TABS: 7.5000 mg | ORAL_TABLET | Freq: Every day | ORAL | 1 refills | Status: DC

## 2019-10-23 MED ORDER — DIPHENHYDRAMINE HCL 25 MG PO CAPS
25.0000 mg | ORAL_CAPSULE | ORAL | Status: AC
  Administered 2019-10-23: 07:00:00 25 mg via ORAL
  Filled 2019-10-23: qty 1

## 2019-10-23 MED ORDER — DIAZEPAM 5 MG PO TABS
10.0000 mg | ORAL_TABLET | ORAL | Status: AC
  Administered 2019-10-23: 07:00:00 10 mg via ORAL
  Filled 2019-10-23: qty 2

## 2019-10-23 MED ORDER — CIPROFLOXACIN HCL 500 MG PO TABS
500.0000 mg | ORAL_TABLET | ORAL | Status: AC
  Administered 2019-10-23: 500 mg via ORAL
  Filled 2019-10-23: qty 1

## 2019-10-23 MED ORDER — SODIUM CHLORIDE 0.9 % IV SOLN
INTRAVENOUS | Status: DC
  Administered 2019-10-23: 07:00:00 via INTRAVENOUS

## 2019-10-23 NOTE — Discharge Instructions (Signed)

## 2019-10-23 NOTE — Op Note (Signed)
Left UPJ stone, 12 mm   Left ESWL  Findings: stone faded well but she may need a staged procedure if she fails to pass the fragments. She tolerated procedure well.

## 2019-10-23 NOTE — Interval H&P Note (Signed)
History and Physical Interval Note:  10/23/2019 7:42 AM  Sonya Peterson  has presented today for surgery, with the diagnosis of LEFT URETEROPELVIC JUNCTION STONE.  The various methods of treatment have been discussed with the patient and family. After consideration of risks, benefits and other options for treatment, the patient has consented to  Procedure(s): EXTRACORPOREAL SHOCK WAVE LITHOTRIPSY (ESWL) (Left) as a surgical intervention.  The patient's history has been reviewed, patient examined, no change in status, stable for surgery.  I have reviewed the patient's chart and labs.  Questions were answered to the patient's satisfaction.  She is well with no fever or dysuria. No cough or congestion.    Festus Aloe

## 2019-10-24 ENCOUNTER — Encounter (HOSPITAL_COMMUNITY): Payer: Self-pay | Admitting: Urology

## 2019-10-30 ENCOUNTER — Encounter: Payer: Self-pay | Admitting: Physician Assistant

## 2019-11-16 ENCOUNTER — Other Ambulatory Visit: Payer: Self-pay | Admitting: Physician Assistant

## 2019-11-16 DIAGNOSIS — E785 Hyperlipidemia, unspecified: Secondary | ICD-10-CM

## 2019-12-18 ENCOUNTER — Encounter: Payer: Self-pay | Admitting: Physician Assistant

## 2019-12-18 ENCOUNTER — Other Ambulatory Visit: Payer: Self-pay

## 2019-12-18 ENCOUNTER — Ambulatory Visit (INDEPENDENT_AMBULATORY_CARE_PROVIDER_SITE_OTHER): Admitting: Physician Assistant

## 2019-12-18 VITALS — BP 128/88 | HR 86 | Temp 97.5°F | Ht 65.0 in | Wt 203.0 lb

## 2019-12-18 DIAGNOSIS — Z13220 Encounter for screening for lipoid disorders: Secondary | ICD-10-CM

## 2019-12-18 DIAGNOSIS — Z130 Encounter for screening for diseases of the blood and blood-forming organs and certain disorders involving the immune mechanism: Secondary | ICD-10-CM

## 2019-12-18 DIAGNOSIS — Z136 Encounter for screening for cardiovascular disorders: Secondary | ICD-10-CM | POA: Diagnosis not present

## 2019-12-18 DIAGNOSIS — T7840XA Allergy, unspecified, initial encounter: Secondary | ICD-10-CM

## 2019-12-18 DIAGNOSIS — I10 Essential (primary) hypertension: Secondary | ICD-10-CM | POA: Diagnosis not present

## 2019-12-18 DIAGNOSIS — Z79899 Other long term (current) drug therapy: Secondary | ICD-10-CM

## 2019-12-18 DIAGNOSIS — Z1389 Encounter for screening for other disorder: Secondary | ICD-10-CM

## 2019-12-18 DIAGNOSIS — E669 Obesity, unspecified: Secondary | ICD-10-CM

## 2019-12-18 DIAGNOSIS — Z0001 Encounter for general adult medical examination with abnormal findings: Secondary | ICD-10-CM

## 2019-12-18 DIAGNOSIS — Z131 Encounter for screening for diabetes mellitus: Secondary | ICD-10-CM

## 2019-12-18 DIAGNOSIS — F3341 Major depressive disorder, recurrent, in partial remission: Secondary | ICD-10-CM

## 2019-12-18 DIAGNOSIS — E785 Hyperlipidemia, unspecified: Secondary | ICD-10-CM

## 2019-12-18 DIAGNOSIS — Z0000 Encounter for general adult medical examination without abnormal findings: Secondary | ICD-10-CM

## 2019-12-18 DIAGNOSIS — E559 Vitamin D deficiency, unspecified: Secondary | ICD-10-CM | POA: Diagnosis not present

## 2019-12-18 DIAGNOSIS — E611 Iron deficiency: Secondary | ICD-10-CM

## 2019-12-18 DIAGNOSIS — R7309 Other abnormal glucose: Secondary | ICD-10-CM

## 2019-12-18 MED ORDER — LORATADINE 10 MG PO TABS: ORAL_TABLET | ORAL | 1 refills | Status: DC

## 2019-12-18 NOTE — Progress Notes (Signed)
Complete Physical  Assessment and Plan:  Encounter for general adult medical examination with abnormal findings 1 year  Essential hypertension - continue medications, DASH diet, exercise and monitor at home. Call if greater than 130/80.  -     CBC with Differential/Platelet -     COMPLETE METABOLIC PANEL WITH GFR -     TSH -     Urinalysis, Routine w reflex microscopic -     Microalbumin / creatinine urine ratio -     EKG 12-Lead  Hyperlipidemia, unspecified hyperlipidemia type check lipids decrease fatty foods increase activity.  -     Lipid panel  Abnormal glucose Discussed disease progression and risks Discussed diet/exercise, weight management and risk modification -     Hemoglobin A1c  Recurrent major depressive disorder, in partial remission ([place]) - continue medications, stress management techniques discussed, increase water, good sleep hygiene discussed, increase exercise, and increase veggies.   Obesity (BMI 30.0-34.9) - follow up 3 months for progress monitoring - increase veggies, decrease carbs - long discussion about weight loss, diet, and exercise  Vitamin D deficiency -     VITAMIN D 25 Hydroxy (Vit-D Deficiency, Fractures)  Allergic state, initial encounter Allergic rhinitis - Allegra OTC, increase H20, allergy hygiene explained.  Medication management -     Magnesium  Iron deficiency -     Iron,Total/Total Iron Binding Cap -     Ferritin -     Vitamin B12   Discussed med's effects and SE's. Screening labs and tests as requested with regular follow-up as recommended. Over 40 minutes of exam, counseling, chart review, and critical decision making was performed this visit.  Future Appointments  Date Time Provider [place]  12/18/2020  3:00 PM Rich Fuchs [facility] None    HPI  64 y.o. female  presents for a complete physical.  Patient is s/p left lithotripsy with Dr. Junious Silk in Nov 2020.   BMI is Body mass index is 33.78  kg/m., she is working on diet and exercise.  she is working on diet and exercise. Goal wight is 193. Wt Readings from Last 3 Encounters:  12/18/19 203 lb (92.1 kg)  10/23/19 187 lb (84.8 kg)  06/12/19 204 lb 12.8 oz (92.9 kg)   Her blood pressure has been controlled at home, today their BP is BP: 128/88 She does not workout. She denies chest pain, shortness of breath, dizziness.  She is on cholesterol medication, zocor 40mg  and denies myalgias. Her cholesterol is at goal. The cholesterol last visit was:   Lab Results  Component Value Date   CHOL 138 06/12/2019   HDL 37 (L) 06/12/2019   LDLCALC 81 06/12/2019   TRIG 108 06/12/2019   CHOLHDL 3.7 06/12/2019   She has been working on diet and exercise for prediabetes, and denies paresthesia of the feet, polydipsia, polyuria and visual disturbances. Last A1C in the office was:  Lab Results  Component Value Date   HGBA1C 5.9 (H) 06/12/2019   Patient is on Vitamin D supplement.   Lab Results  Component Value Date   VD25OH 69 06/12/2019   She is on prozac 20mg , 3 tabs a day for depression which helps.    Current Medications:    Current Facility-Administered Medications (Endocrine & Metabolic):  .  dexamethasone (DECADRON) injection 10 mg  Current Outpatient Medications (Cardiovascular):  .  simvastatin (ZOCOR) 40 MG tablet, Take 1 tablet at Bedtime for Cholesterol .  telmisartan (MICARDIS) 80 MG tablet, Take 1 tablet Daily for BP  Current Outpatient Medications (Respiratory):  .  albuterol (VENTOLIN HFA) 108 (90 Base) MCG/ACT inhaler, Inhale 2 puffs into the lungs every 4 (four) hours as needed for wheezing or shortness of breath. .  fluticasone-salmeterol (ADVAIR HFA) 115-21 MCG/ACT inhaler, Inhale 2 puffs into the lungs 2 (two) times daily. (Patient taking differently: Inhale 2 puffs into the lungs 2 (two) times daily as needed. ) .  loratadine (CLARITIN) 10 MG tablet, TAKE 1 TABLET DAILY FOR ALLERGIES .  montelukast  (SINGULAIR) 10 MG tablet, Take 1 tablet (10 mg total) by mouth daily.   Current Outpatient Medications (Analgesics):  .  HYDROcodone-acetaminophen (NORCO/VICODIN) 5-325 MG tablet, Take 1-2 tablets by mouth every 6 (six) hours as needed for moderate pain. .  meloxicam (MOBIC) 7.5 MG tablet, Take 1 tablet (7.5 mg total) by mouth daily.     Current Outpatient Medications (Other):  Marland Kitchen  Cholecalciferol (VITAMIN D3) 5000 units TABS, Take 5,000 Units by mouth at bedtime. Marland Kitchen  FLUoxetine (PROZAC) 20 MG capsule, Take 3 capsules Daily for Mood .  tamsulosin (FLOMAX) 0.4 MG CAPS capsule, Take 0.4 mg by mouth.   Health Maintenance:   Immunization History  Administered Date(s) Administered  . Influenza Inj Mdck Quad With Preservative 08/16/2017, 09/20/2018, 09/13/2019  . Influenza Split 08/31/2013, 10/04/2014, 10/09/2015  . Influenza, Seasonal, Injecte, Preservative Fre 09/21/2016  . Tdap 03/28/2015   Tetanus: 2016 Pneumovax: 1999 Flu vaccine: 20 Zostavax: N/A  Pap: 10/2018 neg HPV Diagnostic MGM: 01/2018 OVERDUE DEXA: N/A Colonoscopy: 2011, Dr. Earlean Shawl DUE this year EGD:N/A LMP 2008 DEE- unknown  Patient Care Team: Unk Pinto, MD as PCP - General (Internal Medicine) Newt Minion, MD as Consulting Physician (Orthopedic Surgery) Richmond Campbell, MD as Consulting Physician (Gastroenterology)  Medical History:  Past Medical History:  Diagnosis Date  . Allergy   . Arthritis   . Bronchitis   . Depression    OCD  . History of kidney stones   . Hyperlipidemia   . Hypertension   . Obesity (BMI 30.0-34.9)   . Prediabetes 11/09/2013  . Vitamin D deficiency    Allergies No Known Allergies  SURGICAL HISTORY She  has a past surgical history that includes Lumbar disc surgery (2004); Tubal ligation; Dilation and curettage of uterus; Heel spur surgery (Left, 2015); Back surgery; Achilles tendon surgery; I & D extremity (Right, 08/06/2017); Breast surgery (Left); and Extracorporeal  shock wave lithotripsy (Left, 10/23/2019).   FAMILY HISTORY Her family history includes Cancer (age of onset: 66) in her mother; Cancer (age of onset: 19) in her father; Diabetes in her mother; Hypertension in her mother.  Breast cancer for mom 69.   SOCIAL HISTORY She  reports that she has never smoked. She has never used smokeless tobacco. She reports that she does not drink alcohol or use drugs.  Review of Systems: Review of Systems  Constitutional: Negative.  Negative for chills, diaphoresis, fever, malaise/fatigue and weight loss.  HENT: Negative for congestion, ear discharge, ear pain, hearing loss, nosebleeds, sinus pain, sore throat and tinnitus.   Eyes: Negative.   Respiratory: Negative.  Negative for cough, shortness of breath and stridor.   Cardiovascular: Negative.  Negative for chest pain and leg swelling.  Gastrointestinal: Negative.   Genitourinary: Negative for dysuria, flank pain, frequency, hematuria and urgency.  Skin: Negative.  Negative for itching and rash.  Neurological: Negative.   Endo/Heme/Allergies: Negative.   Psychiatric/Behavioral: Negative.     Physical Exam: Estimated body mass index is 33.78 kg/m as calculated from the following:  Height as of this encounter: 5\' 5"  (1.651 m).   Weight as of this encounter: 203 lb (92.1 kg). BP 128/88   Pulse 86   Temp (!) 97.5 F (36.4 C)   Ht 5\' 5"  (1.651 m)   Wt 203 lb (92.1 kg)   SpO2 95%   BMI 33.78 kg/m  General Appearance: Well nourished, in no apparent distress.  Eyes: PERRLA, EOMs, conjunctiva no swelling or erythema, normal fundi and vessels.  Sinuses: No Frontal/maxillary tenderness  ENT/Mouth: Ext aud canals clear, normal light reflex with TMs without erythema, bulging. Good dentition. No erythema, swelling, or exudate on post pharynx. Tonsils not swollen or erythematous. Hearing normal.  Neck: Supple, thyroid normal. No bruits  Respiratory: Respiratory effort normal, BS equal bilaterally without  rales, rhonchi, wheezing or stridor.  Cardio: RRR without murmurs, rubs or gallops. Brisk peripheral pulses without edema.  Chest: symmetric, with normal excursions and percussion.  Breasts: defer Abdomen: Soft, obese, non tender no guarding, rebound,  masses, or organomegaly.+ ventral hernia and small umbilical hernia Lymphatics: Non tender without lymphadenopathy.  Genitourinary: defer Musculoskeletal: Full ROM all peripheral extremities,5/5 strength, and normal gait.  Skin:   Warm, dry without rashes, lesions, ecchymosis. Neuro: Cranial nerves intact, reflexes equal bilaterally. Normal muscle tone, no cerebellar symptoms. Sensation intact.  Psych: Awake and oriented X 3, normal affect, Insight and Judgment appropriate.   EKG: WNL no changes.  Vicie Mutters 3:10 PM [facility]

## 2019-12-18 NOTE — Patient Instructions (Addendum)
HOW TO SCHEDULE A MAMMOGRAM  [facility]  7 a.m.-6:30 p.m., Monday 7 a.m.-5 p.m., Tuesday-Friday Schedule an appointment by calling [phone].  Call for colonoscopy, Dr. Earlean Shawl Phone: [phone];  Colon cancer is 3rd most diagnosed cancer and 2nd leading cause of death in both men and women 62 years of age and older despite being one of the most preventable and treatable cancers if found early.  4 of out 5 people diagnosed with colon cancer have NO prior family history.  When caught EARLY 90% of colon cancer is curable.  General eating tips  What to Avoid . Avoid added sugars o Often added sugar can be found in processed foods such as many condiments, dry cereals, cakes, cookies, chips, crisps, crackers, candies, sweetened drinks, etc.  o Read labels and AVOID/DECREASE use of foods with the following in their ingredient list: Sugar, fructose, high fructose corn syrup, sucrose, glucose, maltose, dextrose, molasses, cane sugar, brown sugar, any type of syrup, agave nectar, etc.   . Avoid snacking in between meals- drink water or if you feel you need a snack, pick a high water content snack such as cucumbers, watermelon, or any veggie.  Marland Kitchen Avoid foods made with flour o If you are going to eat food made with flour, choose those made with whole-grains; and, minimize your consumption as much as is tolerable . Avoid processed foods o These foods are generally stocked in the middle of the grocery store.  o Focus on shopping on the perimeter of the grocery.  What to Include . Vegetables o GREEN LEAFY VEGETABLES: Kale, spinach, mustard greens, collard greens, cabbage, broccoli, etc. o OTHER: Asparagus, cauliflower, eggplant, carrots, peas, Brussel sprouts, tomatoes, bell peppers, zucchini, beets, cucumbers, etc. . Grains, seeds, and legumes o Beans: kidney beans, black eyed peas, garbanzo beans, black beans, pinto beans, etc. o Whole, unrefined grains: brown  rice, barley, bulgur, oatmeal, etc. . Healthy fats  o Avoid highly processed fats such as vegetable oil o Examples of healthy fats: avocado, olives, virgin olive oil, dark chocolate (?72% Cocoa), nuts (peanuts, almonds, walnuts, cashews, pecans, etc.) o Please still do small amount of these healthy fats, they are dense in calories.  . Low - Moderate Intake of Animal Sources of Protein o Meat sources: chicken, [place], salmon, tuna. Limit to 4 ounces of meat at one time or the size of your palm. o Consider limiting dairy sources, but when choosing dairy focus on: PLAIN [place] yogurt, cottage cheese, high-protein milk . Fruit o Choose berries    Ask insurance and pharmacy about shingrix - new vaccine   Can go to [email or web address] for more information  Shingrix Vaccination  Two vaccines are licensed and recommended to prevent shingles in the [place] Zoster vaccine live (ZVL, Zostavax) has been in use since 2006. Recombinant zoster vaccine (RZV, Shingrix), has been in use since 2017 and is recommended by ACIP as the preferred shingles vaccine.  What Everyone Should Know about Shingles Vaccine (Shingrix) One of the Recommended Vaccines by Disease Shingles vaccination is the only way to protect against shingles and postherpetic neuralgia (PHN), the most common complication from shingles. CDC recommends that healthy adults 50 years and older get two doses of the shingles vaccine called Shingrix (recombinant zoster vaccine), separated by 2 to 6 months, to prevent shingles and the complications from the disease. Your doctor or pharmacist can give you Shingrix as a shot in your upper arm. Shingrix provides strong protection against shingles and PHN. Two  doses of Shingrix is more than 90% effective at preventing shingles and PHN. Protection stays above 85% for at least the first four years after you get vaccinated. Shingrix is the preferred vaccine, over  Zostavax (zoster vaccine live), a shingles vaccine in use since 2006. Zostavax may still be used to prevent shingles in healthy adults 60 years and older. For example, you could use Zostavax if a person is allergic to Shingrix, prefers Zostavax, or requests immediate vaccination and Shingrix is unavailable. Who Should Get Shingrix? Healthy adults 50 years and older should get two doses of Shingrix, separated by 2 to 6 months. You should get Shingrix even if in the past you . had shingles  . received Zostavax  . are not sure if you had chickenpox There is no maximum age for getting Shingrix. If you had shingles in the past, you can get Shingrix to help prevent future occurrences of the disease. There is no specific length of time that you need to wait after having shingles before you can receive Shingrix, but generally you should make sure the shingles rash has gone away before getting vaccinated. You can get Shingrix whether or not you remember having had chickenpox in the past. Studies show that more than 99% of Americans 40 years and older have had chickenpox, even if they don't remember having the disease. Chickenpox and shingles are related because they are caused by the same virus (varicella zoster virus). After a person recovers from chickenpox, the virus stays dormant (inactive) in the body. It can reactivate years later and cause shingles. If you had Zostavax in the recent past, you should wait at least eight weeks before getting Shingrix. Talk to your healthcare provider to determine the best time to get Shingrix. Shingrix is available in [employer] [facility] and pharmacies. To find doctor's offices or pharmacies near you that offer the vaccine, visit HealthMap Vaccine FinderExternal. If you have questions about Shingrix, talk with your healthcare provider. Vaccine for Those 16 Years and Older  Shingrix reduces the risk of shingles and PHN by more than 90% in people 47 and older. CDC recommends  the vaccine for healthy adults 41 and older.  Who Should Not Get Shingrix? You should not get Shingrix if you: . have ever had a severe allergic reaction to any component of the vaccine or after a dose of Shingrix  . tested negative for immunity to varicella zoster virus. If you test negative, you should get chickenpox vaccine.  . currently have shingles  . currently are pregnant or breastfeeding. Women who are pregnant or breastfeeding should wait to get Shingrix.  Marland Kitchen receive specific antiviral drugs (acyclovir, famciclovir, or valacyclovir) 24 hours before vaccination (avoid use of these antiviral drugs for 14 days after vaccination)- zoster vaccine live only If you have a minor acute (starts suddenly) illness, such as a cold, you may get Shingrix. But if you have a moderate or severe acute illness, you should usually wait until you recover before getting the vaccine. This includes anyone with a temperature of 101.69F or higher. The side effects of the Shingrix are temporary, and usually last 2 to 3 days. While you may experience pain for a few days after getting Shingrix, the pain will be less severe than having shingles and the complications from the disease. How Well Does Shingrix Work? Two doses of Shingrix provides strong protection against shingles and postherpetic neuralgia (PHN), the most common complication of shingles. . In adults 15 to 62 years old who got  two doses, Shingrix was 97% effective in preventing shingles; among adults 70 years and older, Shingrix was 91% effective.  . In adults 72 to 62 years old who got two doses, Shingrix was 91% effective in preventing PHN; among adults 70 years and older, Shingrix was 89% effective. Shingrix protection remained high (more than 85%) in people 70 years and older throughout the four years following vaccination. Since your risk of shingles and PHN increases as you get older, it is important to have strong protection against shingles in your  older years. Top of Page  What Are the Possible Side Effects of Shingrix? Studies show that Shingrix is safe. The vaccine helps your body create a strong defense against shingles. As a result, you are likely to have temporary side effects from getting the shots. The side effects may affect your ability to do normal daily activities for 2 to 3 days. Most people got a sore arm with mild or moderate pain after getting Shingrix, and some also had redness and swelling where they got the shot. Some people felt tired, had muscle pain, a headache, shivering, fever, stomach pain, or nausea. About 1 out of 6 people who got Shingrix experienced side effects that prevented them from doing regular activities. Symptoms went away on their own in about 2 to 3 days. Side effects were more common in younger people. You might have a reaction to the first or second dose of Shingrix, or both doses. If you experience side effects, you may choose to take over-the-counter pain medicine such as ibuprofen or acetaminophen. If you experience side effects from Shingrix, you should report them to the Vaccine Adverse Event Reporting System (VAERS). Your doctor might file this report, or you can do it yourself through the VAERS websiteExternal, or by calling [phone]. If you have any questions about side effects from Shingrix, talk with your doctor. The shingles vaccine does not contain thimerosal (a preservative containing mercury). Top of Page  When Should I See a Doctor Because of the Side Effects I Experience From Shingrix? In clinical trials, Shingrix was not associated with serious adverse events. In fact, serious side effects from vaccines are extremely rare. For example, for every 1 million doses of a vaccine given, only one or two people may have a severe allergic reaction. Signs of an allergic reaction happen within minutes or hours after vaccination and include hives, swelling of the face and throat, difficulty  breathing, a fast heartbeat, dizziness, or weakness. If you experience these or any other life-threatening symptoms, see a doctor right away. Shingrix causes a strong response in your immune system, so it may produce short-term side effects more intense than you are used to from other vaccines. These side effects can be uncomfortable, but they are expected and usually go away on their own in 2 or 3 days. Top of Page  How Can I Pay For Shingrix? There are several ways shingles vaccine may be paid for: Medicare . Medicare Part D plans cover the shingles vaccine, but there may be a cost to you depending on your plan. There may be a copay for the vaccine, or you may need to pay in full then get reimbursed for a certain amount.  . Medicare Part B does not cover the shingles vaccine. Medicaid . Medicaid may or may not cover the vaccine. Contact your insurer to find out. Private health insurance . Many private health insurance plans will cover the vaccine, but there may be a cost to you  depending on your plan. Contact your insurer to find out. Vaccine assistance programs . Some pharmaceutical companies provide vaccines to eligible adults who cannot afford them. You may want to check with the vaccine manufacturer, [employer]axo[employer], about Shingrix. If you do not currently have health insurance, learn more about affordable health coverage optionsExternal. To find doctor's offices or pharmacies near you that offer the vaccine, visit HealthMap Vaccine FinderExternal.

## 2019-12-19 LAB — COMPLETE METABOLIC PANEL WITH GFR
AG Ratio: 1.4 (calc) (ref 1.0–2.5)
ALT: 10 U/L (ref 6–29)
AST: 13 U/L (ref 10–35)
Albumin: 4.4 g/dL (ref 3.6–5.1)
Alkaline phosphatase (APISO): 80 U/L (ref 37–153)
BUN: 20 mg/dL (ref 7–25)
CO2: 27 mmol/L (ref 20–32)
Calcium: 9.6 mg/dL (ref 8.6–10.4)
Chloride: 104 mmol/L (ref 98–110)
Creat: 0.69 mg/dL (ref 0.50–0.99)
GFR, Est African American: 109 mL/min/{1.73_m2} (ref >=60)
GFR, Est Non African American: 94 mL/min/{1.73_m2} (ref >=60)
Globulin: 3.1 g/dL (calc) (ref 1.9–3.7)
Glucose, Bld: 75 mg/dL (ref 65–99)
Potassium: 4 mmol/L (ref 3.5–5.3)
Sodium: 140 mmol/L (ref 135–146)
Total Bilirubin: 0.3 mg/dL (ref 0.2–1.2)
Total Protein: 7.5 g/dL (ref 6.1–8.1)

## 2019-12-19 LAB — HEMOGLOBIN A1C
Hgb A1c MFr Bld: 5.7 % of total Hgb — ABNORMAL HIGH (ref <5.7)
Mean Plasma Glucose: 117 (calc)
eAG (mmol/L): 6.5 (calc)

## 2019-12-19 LAB — CBC WITH DIFFERENTIAL/PLATELET
Absolute Monocytes: 636 cells/uL (ref 200–950)
Basophils Absolute: 91 cells/uL (ref 0–200)
Basophils Relative: 0.9 %
Eosinophils Absolute: 545 cells/uL — ABNORMAL HIGH (ref 15–500)
Eosinophils Relative: 5.4 %
HCT: 42 % (ref 35.0–45.0)
Hemoglobin: 14.2 g/dL (ref 11.7–15.5)
Lymphs Abs: 2374 cells/uL (ref 850–3900)
MCH: 30 pg (ref 27.0–33.0)
MCHC: 33.8 g/dL (ref 32.0–36.0)
MCV: 88.6 fL (ref 80.0–100.0)
MPV: 9.5 fL (ref 7.5–12.5)
Monocytes Relative: 6.3 %
Neutro Abs: 6454 cells/uL (ref 1500–7800)
Neutrophils Relative %: 63.9 %
Platelets: 345 10*3/uL (ref 140–400)
RBC: 4.74 10*6/uL (ref 3.80–5.10)
RDW: 12.8 % (ref 11.0–15.0)
Total Lymphocyte: 23.5 %
WBC: 10.1 10*3/uL (ref 3.8–10.8)

## 2019-12-19 LAB — TSH: TSH: 1.2 mIU/L (ref 0.40–4.50)

## 2019-12-19 LAB — URINALYSIS, ROUTINE W REFLEX MICROSCOPIC
Bacteria, UA: NONE SEEN /HPF
Bilirubin Urine: NEGATIVE
Glucose, UA: NEGATIVE
Hgb urine dipstick: NEGATIVE
Hyaline Cast: NONE SEEN /LPF
Ketones, ur: NEGATIVE
Nitrite: NEGATIVE
Protein, ur: NEGATIVE
Specific Gravity, Urine: 1.023 (ref 1.001–1.03)
pH: <=5 (ref 5.0–8.0)

## 2019-12-19 LAB — FERRITIN: Ferritin: 140 ng/mL (ref 16–288)

## 2019-12-19 LAB — VITAMIN B12: Vitamin B-12: 371 pg/mL (ref 200–1100)

## 2019-12-19 LAB — LIPID PANEL
Cholesterol: 157 mg/dL (ref <200)
HDL: 32 mg/dL — ABNORMAL LOW (ref >=50)
LDL Cholesterol (Calc): 88 mg/dL (calc)
Non-HDL Cholesterol (Calc): 125 mg/dL (calc) (ref <130)
Total CHOL/HDL Ratio: 4.9 (calc) (ref <5.0)
Triglycerides: 310 mg/dL — ABNORMAL HIGH (ref <150)

## 2019-12-19 LAB — MICROALBUMIN / CREATININE URINE RATIO
Creatinine, Urine: 159 mg/dL (ref 20–275)
Microalb Creat Ratio: 8 mcg/mg creat (ref <30)
Microalb, Ur: 1.3 mg/dL

## 2019-12-19 LAB — MAGNESIUM: Magnesium: 2.1 mg/dL (ref 1.5–2.5)

## 2019-12-19 LAB — IRON, TOTAL/TOTAL IRON BINDING CAP
%SAT: 19 % (calc) (ref 16–45)
Iron: 62 ug/dL (ref 45–160)
TIBC: 329 mcg/dL (calc) (ref 250–450)

## 2019-12-19 LAB — VITAMIN D 25 HYDROXY (VIT D DEFICIENCY, FRACTURES): Vit D, 25-Hydroxy: 47 ng/mL (ref 30–100)

## 2019-12-19 MED ORDER — MAGNESIUM CHLORIDE 64 MG PO TBEC: 1.0000 | DELAYED_RELEASE_TABLET | Freq: Two times a day (BID) | ORAL | 3 refills | Status: DC

## 2020-02-06 IMAGING — CR DG ABDOMEN 1V
2 series · 2 of 2 positions shown · non-contrast
Comparison: CT abdomen pelvis-10/28/2018

CLINICAL DATA: Preoperative examination for lithotripsy of
left-sided renal stone.

EXAM:
ABDOMEN - 1 VIEW

[t abdomen supine (1 of 2)]
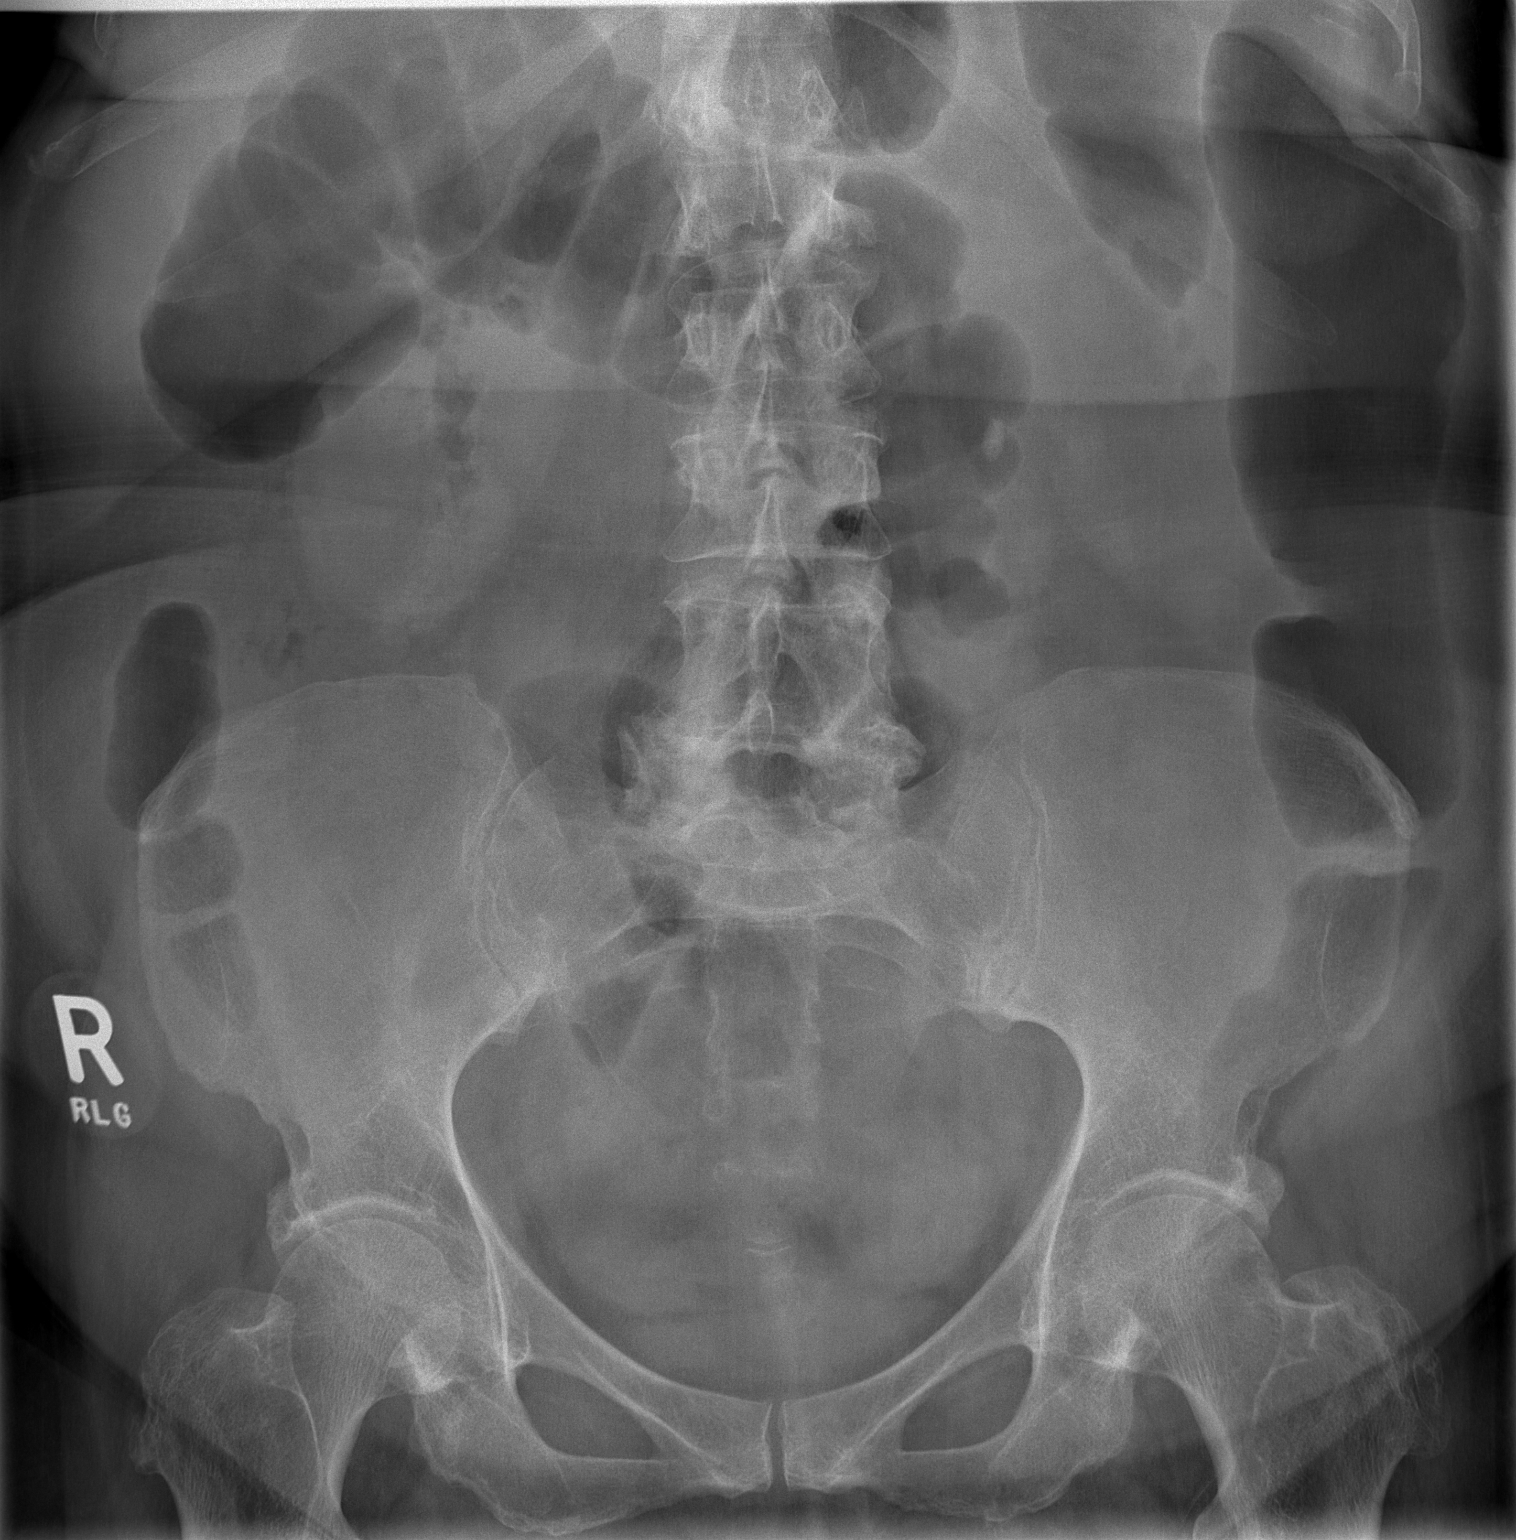

[t abdomen supine (2 of 2)]
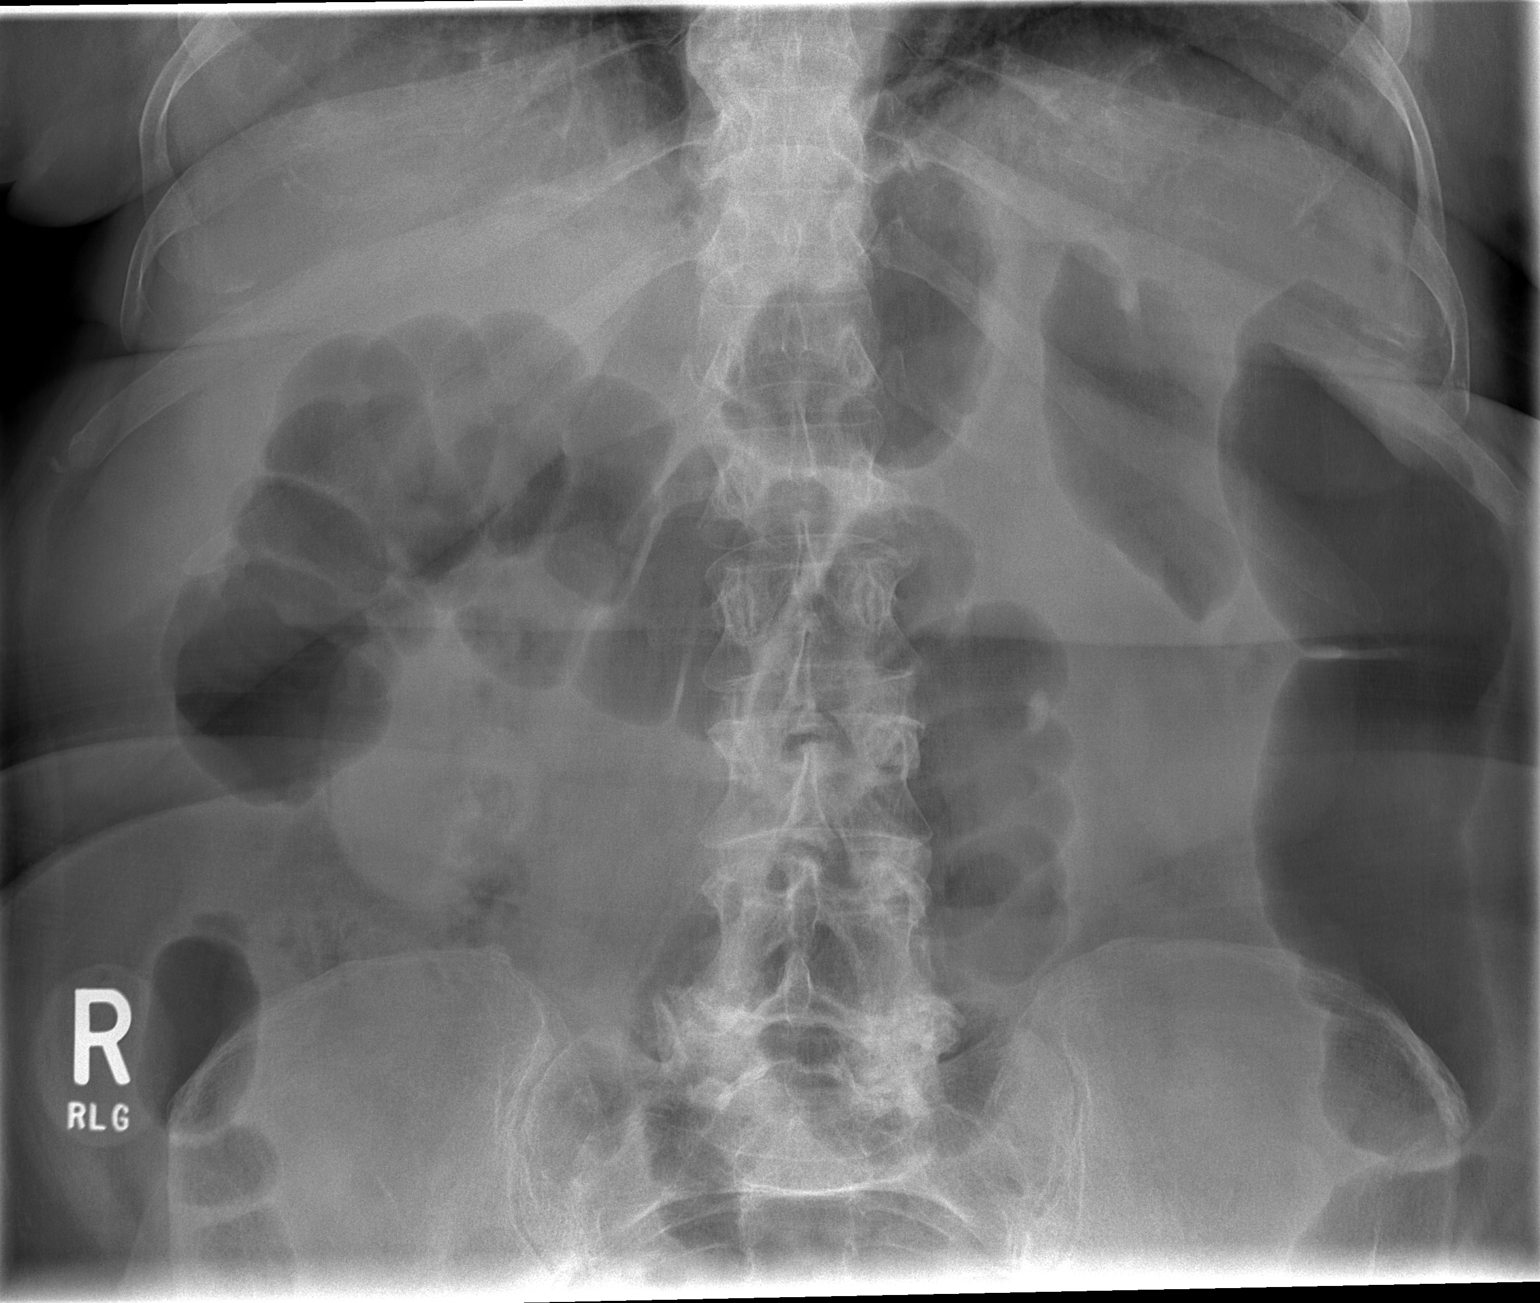

[2 of 2 positions shown; findings below may reference images not displayed]

FINDINGS: There is an approximately 1.1 x 0.6 cm opacity overlying the
expected location of the superior aspect of the left ureter which
may represent migration of previously noted nonobstructing
left-sided inferior pole renal stone demonstrated on CT scan the
abdomen pelvis performed 10/28/2018.

No additional opacities overlies expected location of the left renal
fossa.

No definitive abnormal opacities overlies expected location of the
right renal fossa, ureter or the urinary bladder.

Nonspecific mild patulous gaseous distention of the colon.

Degenerative change of the bilateral hips and the lower lumbar spine
is suspected though incompletely evaluated
IMPRESSION: Suspected migration of previously noted dominant inferior pole left
renal stone now overlies the expected location of the superior
aspect of the left ureter. Further evaluation with noncontrast CT
scan the abdomen and pelvis could be performed as indicated.

## 2020-02-17 ENCOUNTER — Ambulatory Visit: Attending: Internal Medicine

## 2020-02-17 DIAGNOSIS — Z23 Encounter for immunization: Secondary | ICD-10-CM

## 2020-02-17 NOTE — Progress Notes (Signed)
   Covid-19 Vaccination Clinic  Name:  Sonya Peterson    MRN: [id number] DOB: Jul 05, 1956  02/17/2020  [name] was observed post Covid-19 immunization for 15 minutes without incident. She was provided with Vaccine Information Sheet and instruction to access the V-Safe system.   [name] was instructed to call 911 with any severe reactions post vaccine: Marland Kitchen Difficulty breathing  . Swelling of face and throat  . A fast heartbeat  . A bad rash all over body  . Dizziness and weakness   Immunizations Administered    Name Date Dose VIS Date Route   Pfizer COVID-19 Vaccine 02/17/2020  2:44 PM 0.3 mL 11/17/2019 Intramuscular   Manufacturer: [place]   Lot: [id number]   [place]: [id number]

## 2020-03-12 ENCOUNTER — Ambulatory Visit: Attending: Internal Medicine

## 2020-03-12 DIAGNOSIS — Z23 Encounter for immunization: Secondary | ICD-10-CM

## 2020-03-12 NOTE — Progress Notes (Signed)
   Covid-19 Vaccination Clinic  Name:  Sonya Peterson    MRN: [id number] DOB: 1956/05/03  03/12/2020  Ms. Sonya Peterson was observed post Covid-19 immunization for 15 minutes without incident. She was provided with Vaccine Information Sheet and instruction to access the V-Safe system.   Ms. Sonya Peterson was instructed to call 911 with any severe reactions post vaccine: Marland Kitchen Difficulty breathing  . Swelling of face and throat  . A fast heartbeat  . A bad rash all over body  . Dizziness and weakness   Immunizations Administered    Name Date Dose VIS Date Route   Pfizer COVID-19 Vaccine 03/12/2020  3:07 PM 0.3 mL 11/17/2019 Intramuscular   Manufacturer: [employer]   Lot: [id number]   [place]: [id number]

## 2020-04-01 ENCOUNTER — Ambulatory Visit: Admitting: Physician Assistant

## 2020-04-01 NOTE — Progress Notes (Deleted)
Assessment and Plan:   Hypertension  -Continue medication, get a monitor and start to check blood pressure at home, if continue to be above 140/90 call the office and make appointment. Continue DASH diet.  Reminder to go to the ER if any CP, SOB, nausea, dizziness, severe HA, changes vision/speech, left arm numbness and tingling, and jaw pain.  Cholesterol  -Continue diet and exercise. Check cholesterol.   Abnormal glucose -Continue diet and exercise. Check A1C   Vitamin D Def - check level and continue medications.    Obesity with co morbidities - long discussion about weight loss, diet, and exercise  Continue diet and meds as discussed. Further disposition pending results of labs. Future Appointments  Date Time Provider [place]  04/01/2020  3:30 PM Vicie Mutters, PA-C [facility] None  12/18/2020  3:00 PM Vicie Mutters, PA-C [facility] None    HPI 64 y.o. female  presents for 3 month follow up with hypertension, hyperlipidemia, prediabetes and vitamin D. She has history of allergies, asthma, on allergy pill. Has congestion morning and at night, occ productive, white. Some wheezing, SOB. She is not on the advair regularly. Has been x May 19th.  CXR 2018 Her blood pressure has not been checked at home, today their BP is    BP Readings from Last 5 Encounters:  12/18/19 128/88  10/23/19 (!) 165/90  06/12/19 122/84  02/06/19 120/88  10/24/18 (!) 142/84   She does not workout.  She denies chest pain, shortness of breath, dizziness.  She is on cholesterol medication.  zocor 40mg   and denies myalgias. Her cholesterol is at goal. The cholesterol last visit was:   Lab Results  Component Value Date   CHOL 157 12/18/2019   HDL 32 (L) 12/18/2019   LDLCALC 88 12/18/2019   TRIG 310 (H) 12/18/2019   CHOLHDL 4.9 12/18/2019   She has been working on diet and exercise for prediabetes, and denies paresthesia of the feet, polydipsia, polyuria and visual disturbances. Last A1C in  the office was:  Lab Results  Component Value Date   HGBA1C 5.7 (H) 12/18/2019   Patient is on Vitamin D supplement.   Lab Results  Component Value Date   VD25OH 47 12/18/2019     BMI is There is no height or weight on file to calculate BMI., she is struggling with weight loss due to time limitations. Has been continuing to work.  Wt Readings from Last 3 Encounters:  12/18/19 203 lb (92.1 kg)  10/23/19 187 lb (84.8 kg)  06/12/19 204 lb 12.8 oz (92.9 kg)    Current Medications:  Current Outpatient Medications on File Prior to Visit  Medication Sig Dispense Refill  . albuterol (VENTOLIN HFA) 108 (90 Base) MCG/ACT inhaler Inhale 2 puffs into the lungs every 4 (four) hours as needed for wheezing or shortness of breath. 1 Inhaler 0  . Cholecalciferol (VITAMIN D3) 5000 units TABS Take 5,000 Units by mouth at bedtime.    Marland Kitchen FLUoxetine (PROZAC) 20 MG capsule Take 3 capsules Daily for Mood 270 capsule 3  . fluticasone-salmeterol (ADVAIR HFA) 115-21 MCG/ACT inhaler Inhale 2 puffs into the lungs 2 (two) times daily. (Patient taking differently: Inhale 2 puffs into the lungs 2 (two) times daily as needed. ) 1 Inhaler 12  . HYDROcodone-acetaminophen (NORCO/VICODIN) 5-325 MG tablet Take 1-2 tablets by mouth every 6 (six) hours as needed for moderate pain.    Marland Kitchen loratadine (CLARITIN) 10 MG tablet TAKE 1 TABLET DAILY FOR ALLERGIES 90 tablet 1  .  meloxicam (MOBIC) 7.5 MG tablet Take 1 tablet (7.5 mg total) by mouth daily. 90 tablet 1  . montelukast (SINGULAIR) 10 MG tablet Take 1 tablet (10 mg total) by mouth daily. 90 tablet 2  . simvastatin (ZOCOR) 40 MG tablet Take 1 tablet at Bedtime for Cholesterol 90 tablet 3  . tamsulosin (FLOMAX) 0.4 MG CAPS capsule Take 0.4 mg by mouth.    . telmisartan (MICARDIS) 80 MG tablet Take 1 tablet Daily for BP 90 tablet 3   Current Facility-Administered Medications on File Prior to Visit  Medication Dose Route Frequency Provider Last Rate Last Admin  . dexamethasone  (DECADRON) injection 10 mg  10 mg Intramuscular Once Vicie Mutters, PA-C       Medical History:  Past Medical History:  Diagnosis Date  . Allergy   . Arthritis   . Bronchitis   . Depression    OCD  . History of kidney stones   . Hyperlipidemia   . Hypertension   . Obesity (BMI 30.0-34.9)   . Prediabetes 11/09/2013  . Vitamin D deficiency    Allergies: No Known Allergies   Review of Systems  Constitutional: Negative for chills, diaphoresis, fever, malaise/fatigue and weight loss.  HENT: Negative.   Eyes: Negative.   Respiratory: Positive for cough, sputum production and wheezing. Negative for hemoptysis and shortness of breath.   Cardiovascular: Negative.   Gastrointestinal: Negative.   Genitourinary: Negative.   Musculoskeletal: Negative.   Skin: Negative.   Neurological: Negative.  Negative for weakness.  Endo/Heme/Allergies: Negative.   Psychiatric/Behavioral: Negative.     Family history- Review and unchanged Social history- Review and unchanged Physical Exam: There were no vitals taken for this visit. Wt Readings from Last 3 Encounters:  12/18/19 203 lb (92.1 kg)  10/23/19 187 lb (84.8 kg)  06/12/19 204 lb 12.8 oz (92.9 kg)   General Appearance: Well nourished, in no apparent distress. Eyes: PERRLA, EOMs, conjunctiva no swelling or erythema Sinuses: No Frontal/maxillary tenderness ENT/Mouth: Ext aud canals clear, TMs without erythema, bulging. No erythema, swelling, or exudate on post pharynx.  Tonsils not swollen or erythematous. Hearing normal.  Neck: Supple, thyroid normal.  Respiratory: Respiratory effort normal, BS equal bilaterally with wheezing without rales, rhonchi, or stridor.  Cardio: RRR with no MRGs. Brisk peripheral pulses without edema.  Abdomen: Soft, + BS.  Non tender, no guarding, rebound, hernias, masses. Lymphatics: Non tender without lymphadenopathy.  Musculoskeletal: Full ROM, 5/5 strength, normal gait.  Skin:  Warm, dry without rashes,  lesions, ecchymosis.  Neuro: Cranial nerves intact. Normal muscle tone, no cerebellar symptoms. Sensation intact.  Psych: Awake and oriented X 3, normal affect, Insight and Judgment appropriate.    Vicie Mutters, PA-C 9:01 AM [facility]

## 2020-04-26 NOTE — Progress Notes (Signed)
Assessment and Plan:   Hypertension  -Continue medication, get a monitor and start to check blood pressure at home, if continue to be above 140/90 call the office and make appointment. Continue DASH diet.  Reminder to go to the ER if any CP, SOB, nausea, dizziness, severe HA, changes vision/speech, left arm numbness and tingling, and jaw pain.  Cholesterol  -Continue diet and exercise. Check cholesterol.   Abnormal glucose -Continue diet and exercise. Check A1C   Vitamin D Def - check level and continue medications.    Obesity with co morbidities - long discussion about weight loss, diet, and exercise  Iron deficiency -     Iron,Total/Total Iron Binding Cap -     Ferritin -     Vitamin B12   Continue diet and meds as discussed. Further disposition pending results of labs. Future Appointments  Date Time Provider [place]  04/30/2020 10:30 AM Vicie Mutters, PA-C [facility] None  12/18/2020  3:00 PM Vicie Mutters, PA-C [facility] None    HPI 64 y.o. female  presents for 3 month follow up with hypertension, hyperlipidemia, prediabetes and vitamin D.  Her mom passed 19-Apr-2020, died at 59, then [place] graduated the next Friday from pharmacy school.  She has history of allergies, asthma, on allergy pill. Has congestion morning and at night, occ productive, white. Some wheezing, SOB. She is not on the advair regularly.  CXR 06/2019 Her blood pressure has not been checked at home, today their BP is BP: 128/74  BP Readings from Last 5 Encounters:  04/30/20 128/74  12/18/19 128/88  10/23/19 (!) 165/90  06/12/19 122/84  02/06/19 120/88   She does not workout.  She denies chest pain, shortness of breath, dizziness.  She is on cholesterol medication.  zocor 40mg   and denies myalgias. Her cholesterol is at goal. The cholesterol last visit was:   Lab Results  Component Value Date   CHOL 157 12/18/2019   HDL 32 (L) 12/18/2019   LDLCALC 88 12/18/2019   TRIG 310 (H)  12/18/2019   CHOLHDL 4.9 12/18/2019   She has been working on diet and exercise for prediabetes, and denies paresthesia of the feet, polydipsia, polyuria and visual disturbances. Last A1C in the office was:  Lab Results  Component Value Date   HGBA1C 5.7 (H) 12/18/2019   Patient is on Vitamin D supplement.   Lab Results  Component Value Date   VD25OH 47 12/18/2019     BMI is Body mass index is 33.61 kg/m., she is struggling with weight loss due to time limitations. Has been continuing to work.  Wt Readings from Last 3 Encounters:  04/30/20 202 lb (91.6 kg)  12/18/19 203 lb (92.1 kg)  10/23/19 187 lb (84.8 kg)    Current Medications:    Current Facility-Administered Medications (Endocrine & Metabolic):  .  dexamethasone (DECADRON) injection 10 mg  Current Outpatient Medications (Cardiovascular):  .  simvastatin (ZOCOR) 40 MG tablet, Take 1 tablet at Bedtime for Cholesterol .  telmisartan (MICARDIS) 80 MG tablet, Take 1 tablet Daily for BP   Current Outpatient Medications (Respiratory):  .  albuterol (VENTOLIN HFA) 108 (90 Base) MCG/ACT inhaler, Inhale 2 puffs into the lungs every 4 (four) hours as needed for wheezing or shortness of breath. .  fluticasone-salmeterol (ADVAIR HFA) 115-21 MCG/ACT inhaler, Inhale 2 puffs into the lungs 2 (two) times daily. (Patient taking differently: Inhale 2 puffs into the lungs 2 (two) times daily as needed. ) .  loratadine (CLARITIN) 10  MG tablet, TAKE 1 TABLET DAILY FOR ALLERGIES .  montelukast (SINGULAIR) 10 MG tablet, Take 1 tablet (10 mg total) by mouth daily.   Current Outpatient Medications (Analgesics):  .  meloxicam (MOBIC) 7.5 MG tablet, Take 1 tablet (7.5 mg total) by mouth daily.     Current Outpatient Medications (Other):  Marland Kitchen  Cholecalciferol (VITAMIN D3) 5000 units TABS, Take 5,000 Units by mouth at bedtime. Marland Kitchen  FLUoxetine (PROZAC) 20 MG capsule, Take 3 capsules Daily for Mood .  tamsulosin (FLOMAX) 0.4 MG CAPS capsule, Take  0.4 mg by mouth.   Medical History:  Past Medical History:  Diagnosis Date  . Allergy   . Arthritis   . Bronchitis   . Depression    OCD  . History of kidney stones   . Hyperlipidemia   . Hypertension   . Obesity (BMI 30.0-34.9)   . Prediabetes 11/09/2013  . Vitamin D deficiency    Allergies: No Known Allergies   Review of Systems  Constitutional: Negative for chills, diaphoresis, fever, malaise/fatigue and weight loss.  HENT: Negative.   Eyes: Negative.   Respiratory: Positive for cough, sputum production and wheezing. Negative for hemoptysis and shortness of breath.   Cardiovascular: Negative.   Gastrointestinal: Negative.   Genitourinary: Negative.   Musculoskeletal: Negative.   Skin: Negative.   Neurological: Negative.  Negative for weakness.  Endo/Heme/Allergies: Negative.   Psychiatric/Behavioral: Negative.     Family history- Review and unchanged Social history- Review and unchanged Physical Exam: BP 128/74   Pulse 74   Temp 97.6 F (36.4 C)   Wt 202 lb (91.6 kg)   SpO2 98%   BMI 33.61 kg/m  Wt Readings from Last 3 Encounters:  04/30/20 202 lb (91.6 kg)  12/18/19 203 lb (92.1 kg)  10/23/19 187 lb (84.8 kg)   General Appearance: Well nourished, in no apparent distress. Eyes: PERRLA, EOMs, conjunctiva no swelling or erythema Sinuses: No Frontal/maxillary tenderness ENT/Mouth: Ext aud canals clear, TMs without erythema, bulging. No erythema, swelling, or exudate on post pharynx.  Tonsils not swollen or erythematous. Hearing normal.  Neck: Supple, thyroid normal.  Respiratory: Respiratory effort normal, BS equal bilaterally with wheezing without rales, rhonchi, or stridor.  Cardio: RRR with no MRGs. Brisk peripheral pulses without edema.  Abdomen: Soft, + BS.  Non tender, no guarding, rebound, hernias, masses. Lymphatics: Non tender without lymphadenopathy.  Musculoskeletal: Full ROM, 5/5 strength, normal gait.  Skin:  Warm, dry without rashes, lesions,  ecchymosis.  Neuro: Cranial nerves intact. Normal muscle tone, no cerebellar symptoms. Sensation intact.  Psych: Awake and oriented X 3, normal affect, Insight and Judgment appropriate.    Vicie Mutters, PA-C 10:29 AM [facility]

## 2020-04-30 ENCOUNTER — Encounter: Payer: Self-pay | Admitting: Physician Assistant

## 2020-04-30 ENCOUNTER — Ambulatory Visit (INDEPENDENT_AMBULATORY_CARE_PROVIDER_SITE_OTHER): Admitting: Physician Assistant

## 2020-04-30 ENCOUNTER — Other Ambulatory Visit: Payer: Self-pay

## 2020-04-30 VITALS — BP 128/74 | HR 74 | Temp 97.6°F | Wt 202.0 lb

## 2020-04-30 DIAGNOSIS — E785 Hyperlipidemia, unspecified: Secondary | ICD-10-CM | POA: Diagnosis not present

## 2020-04-30 DIAGNOSIS — E669 Obesity, unspecified: Secondary | ICD-10-CM | POA: Diagnosis not present

## 2020-04-30 DIAGNOSIS — Z79899 Other long term (current) drug therapy: Secondary | ICD-10-CM

## 2020-04-30 DIAGNOSIS — I10 Essential (primary) hypertension: Secondary | ICD-10-CM | POA: Diagnosis not present

## 2020-04-30 DIAGNOSIS — E559 Vitamin D deficiency, unspecified: Secondary | ICD-10-CM | POA: Diagnosis not present

## 2020-04-30 DIAGNOSIS — R7309 Other abnormal glucose: Secondary | ICD-10-CM | POA: Diagnosis not present

## 2020-04-30 DIAGNOSIS — E611 Iron deficiency: Secondary | ICD-10-CM | POA: Diagnosis not present

## 2020-04-30 NOTE — Patient Instructions (Signed)

## 2020-05-08 LAB — INSULIN, RANDOM: Insulin: 11.6 u[IU]/mL

## 2020-05-15 LAB — CBC WITH DIFFERENTIAL/PLATELET
Absolute Monocytes: 489 cells/uL (ref 200–950)
Basophils Absolute: 102 cells/uL (ref 0–200)
Basophils Relative: 1.4 %
Eosinophils Absolute: 504 cells/uL — ABNORMAL HIGH (ref 15–500)
Eosinophils Relative: 6.9 %
HCT: 44.1 % (ref 35.0–45.0)
Hemoglobin: 14.5 g/dL (ref 11.7–15.5)
Lymphs Abs: 1803 cells/uL (ref 850–3900)
MCH: 29.4 pg (ref 27.0–33.0)
MCHC: 32.9 g/dL (ref 32.0–36.0)
MCV: 89.3 fL (ref 80.0–100.0)
MPV: 9.6 fL (ref 7.5–12.5)
Monocytes Relative: 6.7 %
Neutro Abs: 4402 cells/uL (ref 1500–7800)
Neutrophils Relative %: 60.3 %
Platelets: 312 10*3/uL (ref 140–400)
RBC: 4.94 10*6/uL (ref 3.80–5.10)
RDW: 12.4 % (ref 11.0–15.0)
Total Lymphocyte: 24.7 %
WBC: 7.3 10*3/uL (ref 3.8–10.8)

## 2020-05-15 LAB — COMPLETE METABOLIC PANEL WITH GFR
AG Ratio: 1.6 (calc) (ref 1.0–2.5)
ALT: 11 U/L (ref 6–29)
AST: 12 U/L (ref 10–35)
Albumin: 4.4 g/dL (ref 3.6–5.1)
Alkaline phosphatase (APISO): 85 U/L (ref 37–153)
BUN: 16 mg/dL (ref 7–25)
CO2: 27 mmol/L (ref 20–32)
Calcium: 9.1 mg/dL (ref 8.6–10.4)
Chloride: 103 mmol/L (ref 98–110)
Creat: 0.65 mg/dL (ref 0.50–0.99)
GFR, Est African American: 111 mL/min/{1.73_m2} (ref >=60)
GFR, Est Non African American: 96 mL/min/{1.73_m2} (ref >=60)
Globulin: 2.7 g/dL (calc) (ref 1.9–3.7)
Glucose, Bld: 90 mg/dL (ref 65–99)
Potassium: 4.3 mmol/L (ref 3.5–5.3)
Sodium: 137 mmol/L (ref 135–146)
Total Bilirubin: 0.4 mg/dL (ref 0.2–1.2)
Total Protein: 7.1 g/dL (ref 6.1–8.1)

## 2020-05-15 LAB — VITAMIN D 25 HYDROXY (VIT D DEFICIENCY, FRACTURES): Vit D, 25-Hydroxy: 62 ng/mL (ref 30–100)

## 2020-05-15 LAB — FERRITIN: Ferritin: 90 ng/mL (ref 16–288)

## 2020-05-15 LAB — LIPID PANEL
Cholesterol: 145 mg/dL (ref <200)
HDL: 42 mg/dL — ABNORMAL LOW (ref >=50)
LDL Cholesterol (Calc): 83 mg/dL (calc)
Non-HDL Cholesterol (Calc): 103 mg/dL (calc) (ref <130)
Total CHOL/HDL Ratio: 3.5 (calc) (ref <5.0)
Triglycerides: 106 mg/dL (ref <150)

## 2020-05-15 LAB — HEMOGLOBIN A1C
Hgb A1c MFr Bld: 5.6 % of total Hgb (ref <5.7)
Mean Plasma Glucose: 114 (calc)
eAG (mmol/L): 6.3 (calc)

## 2020-05-15 LAB — IRON, TOTAL/TOTAL IRON BINDING CAP
%SAT: 28 % (calc) (ref 16–45)
Iron: 88 ug/dL (ref 45–160)
TIBC: 318 mcg/dL (calc) (ref 250–450)

## 2020-05-15 LAB — MAGNESIUM: Magnesium: 2.1 mg/dL (ref 1.5–2.5)

## 2020-05-15 LAB — VITAMIN B12: Vitamin B-12: 389 pg/mL (ref 200–1100)

## 2020-05-15 LAB — TSH: TSH: 0.95 mIU/L (ref 0.40–4.50)

## 2020-05-15 LAB — INSULIN, RANDOM

## 2020-05-20 ENCOUNTER — Other Ambulatory Visit: Payer: Self-pay

## 2020-05-20 DIAGNOSIS — T7840XA Allergy, unspecified, initial encounter: Secondary | ICD-10-CM

## 2020-05-20 MED ORDER — MONTELUKAST SODIUM 10 MG PO TABS: 10.0000 mg | ORAL_TABLET | Freq: Every day | ORAL | 2 refills | Status: DC

## 2020-05-22 ENCOUNTER — Other Ambulatory Visit: Payer: Self-pay

## 2020-05-22 ENCOUNTER — Ambulatory Visit: Admitting: Podiatry

## 2020-05-22 DIAGNOSIS — T7840XA Allergy, unspecified, initial encounter: Secondary | ICD-10-CM

## 2020-05-22 MED ORDER — MONTELUKAST SODIUM 10 MG PO TABS: 10.0000 mg | ORAL_TABLET | Freq: Every day | ORAL | 2 refills | Status: DC

## 2020-05-22 MED ORDER — MELOXICAM 7.5 MG PO TABS: 7.5000 mg | ORAL_TABLET | Freq: Every day | ORAL | 0 refills | Status: DC

## 2020-06-03 ENCOUNTER — Other Ambulatory Visit: Payer: Self-pay

## 2020-06-03 ENCOUNTER — Ambulatory Visit (INDEPENDENT_AMBULATORY_CARE_PROVIDER_SITE_OTHER): Admitting: Podiatrist

## 2020-06-03 ENCOUNTER — Encounter: Payer: Self-pay | Admitting: Podiatrist

## 2020-06-03 VITALS — Temp 95.7°F

## 2020-06-03 DIAGNOSIS — L84 Corns and callosities: Secondary | ICD-10-CM

## 2020-06-03 MED ORDER — UREA 40 % EX CREA: 1.0000 "application " | TOPICAL_CREAM | Freq: Every day | CUTANEOUS | 2 refills | Status: DC

## 2020-06-03 NOTE — Progress Notes (Signed)
  Chief Complaint  Patient presents with  . Foot Problem    Dry, painful skin - L lateral midfoot. x"Months". Pt stated, "I get pedicures to trim it down - I had one 3 weeks ago. Pain can be 4/10. I use Ucerin".     HPI: Patient is 64 y.o. female who presents today for the concerns as listed above.  She relates she has dry skin and has pedicures where it is trimmed down. It keeps returning and she would like to know if treatment options are available.  She relates no itching or burning to the feet.    Review of Systems No fevers, chills, nausea, muscle aches, no difficulty breathing, no calf pain, no chest pain or shortness of breath.   Physical Exam  GENERAL APPEARANCE: Alert, conversant. Appropriately groomed. No acute distress.   VASCULAR: Pedal pulses palpable DP and PT bilateral.  Capillary refill time is immediate to all digits,  Proximal to distal cooling it warm to warm.  Digital perfusion adequate.   NEUROLOGIC: sensation is intact epicritically and protectively to 5.07 monofilament at 5/5 sites bilateral.  Light touch is intact bilateral, vibratory sensation intact bilateral, achilles tendon reflex is intact bilateral.   MUSCULOSKELETAL: acceptable muscle strength, tone and stability bilateral.  No gross boney pedal deformities noted.  No pain, crepitus or limitation noted with foot and ankle range of motion bilateral.   DERMATOLOGIC: skin is warm, supple, and dry. Callused and dry skin is present sub metatarsal 3/4/5 region of bilateral feet and along the lateral side of the foot-  No itching reported.  Small areas of hyperkeratotic tissue present throughout.      Assessment   Plantar callus    Plan  Recommended using a 40% urea cream daily and using a pumice stone weekly to help with the callus buildup. rx for the urea cream called into her pharmacy.  Debrided some keratotic lesion areas with a 15 blade to healthy tissue beneath.  She will call if symptoms persist  otherwise will be seen back as needed.

## 2020-06-08 ENCOUNTER — Other Ambulatory Visit: Payer: Self-pay | Admitting: Physician Assistant

## 2020-06-08 DIAGNOSIS — F3341 Major depressive disorder, recurrent, in partial remission: Secondary | ICD-10-CM

## 2020-07-15 ENCOUNTER — Other Ambulatory Visit: Payer: Self-pay | Admitting: Physician Assistant

## 2020-07-15 DIAGNOSIS — T7840XA Allergy, unspecified, initial encounter: Secondary | ICD-10-CM

## 2020-07-21 ENCOUNTER — Other Ambulatory Visit: Payer: Self-pay | Admitting: Internal Medicine

## 2020-07-21 MED ORDER — MELOXICAM 7.5 MG PO TABS: ORAL_TABLET | ORAL | 0 refills | Status: DC

## 2020-08-05 ENCOUNTER — Ambulatory Visit: Admitting: Physician Assistant

## 2020-08-12 ENCOUNTER — Ambulatory Visit: Admitting: Physician Assistant

## 2020-08-13 ENCOUNTER — Ambulatory Visit (INDEPENDENT_AMBULATORY_CARE_PROVIDER_SITE_OTHER): Admitting: Adult Health Nurse Practitioner

## 2020-08-13 ENCOUNTER — Other Ambulatory Visit: Payer: Self-pay

## 2020-08-13 ENCOUNTER — Encounter: Payer: Self-pay | Admitting: Adult Health Nurse Practitioner

## 2020-08-13 VITALS — BP 130/70 | HR 66 | Temp 97.6°F | Wt 205.0 lb

## 2020-08-13 DIAGNOSIS — R7309 Other abnormal glucose: Secondary | ICD-10-CM

## 2020-08-13 DIAGNOSIS — E559 Vitamin D deficiency, unspecified: Secondary | ICD-10-CM

## 2020-08-13 DIAGNOSIS — J4520 Mild intermittent asthma, uncomplicated: Secondary | ICD-10-CM

## 2020-08-13 DIAGNOSIS — E66811 Obesity, class 1: Secondary | ICD-10-CM

## 2020-08-13 DIAGNOSIS — F3341 Major depressive disorder, recurrent, in partial remission: Secondary | ICD-10-CM

## 2020-08-13 DIAGNOSIS — E669 Obesity, unspecified: Secondary | ICD-10-CM | POA: Diagnosis not present

## 2020-08-13 DIAGNOSIS — I10 Essential (primary) hypertension: Secondary | ICD-10-CM | POA: Diagnosis not present

## 2020-08-13 DIAGNOSIS — E611 Iron deficiency: Secondary | ICD-10-CM

## 2020-08-13 DIAGNOSIS — Z79899 Other long term (current) drug therapy: Secondary | ICD-10-CM

## 2020-08-13 DIAGNOSIS — E785 Hyperlipidemia, unspecified: Secondary | ICD-10-CM

## 2020-08-13 LAB — COMPLETE METABOLIC PANEL WITH GFR
AG Ratio: 1.6 (calc) (ref 1.0–2.5)
ALT: 13 U/L (ref 6–29)
AST: 12 U/L (ref 10–35)
Albumin: 4.4 g/dL (ref 3.6–5.1)
Alkaline phosphatase (APISO): 96 U/L (ref 37–153)
BUN: 16 mg/dL (ref 7–25)
CO2: 26 mmol/L (ref 20–32)
Calcium: 9.2 mg/dL (ref 8.6–10.4)
Chloride: 105 mmol/L (ref 98–110)
Creat: 0.61 mg/dL (ref 0.50–0.99)
GFR, Est African American: 113 mL/min/{1.73_m2} (ref >=60)
GFR, Est Non African American: 98 mL/min/{1.73_m2} (ref >=60)
Globulin: 2.8 g/dL (calc) (ref 1.9–3.7)
Glucose, Bld: 132 mg/dL — ABNORMAL HIGH (ref 65–99)
Potassium: 4.1 mmol/L (ref 3.5–5.3)
Sodium: 141 mmol/L (ref 135–146)
Total Bilirubin: 0.3 mg/dL (ref 0.2–1.2)
Total Protein: 7.2 g/dL (ref 6.1–8.1)

## 2020-08-13 LAB — CBC WITH DIFFERENTIAL/PLATELET
Absolute Monocytes: 559 cells/uL (ref 200–950)
Basophils Absolute: 81 cells/uL (ref 0–200)
Basophils Relative: 1 %
Eosinophils Absolute: 551 cells/uL — ABNORMAL HIGH (ref 15–500)
Eosinophils Relative: 6.8 %
HCT: 42.2 % (ref 35.0–45.0)
Hemoglobin: 14.2 g/dL (ref 11.7–15.5)
Lymphs Abs: 1920 cells/uL (ref 850–3900)
MCH: 29.8 pg (ref 27.0–33.0)
MCHC: 33.6 g/dL (ref 32.0–36.0)
MCV: 88.7 fL (ref 80.0–100.0)
MPV: 9.5 fL (ref 7.5–12.5)
Monocytes Relative: 6.9 %
Neutro Abs: 4990 cells/uL (ref 1500–7800)
Neutrophils Relative %: 61.6 %
Platelets: 295 10*3/uL (ref 140–400)
RBC: 4.76 10*6/uL (ref 3.80–5.10)
RDW: 13 % (ref 11.0–15.0)
Total Lymphocyte: 23.7 %
WBC: 8.1 10*3/uL (ref 3.8–10.8)

## 2020-08-13 LAB — LIPID PANEL
Cholesterol: 152 mg/dL (ref <200)
HDL: 36 mg/dL — ABNORMAL LOW (ref >=50)
LDL Cholesterol (Calc): 88 mg/dL (calc)
Non-HDL Cholesterol (Calc): 116 mg/dL (calc) (ref <130)
Total CHOL/HDL Ratio: 4.2 (calc) (ref <5.0)
Triglycerides: 182 mg/dL — ABNORMAL HIGH (ref <150)

## 2020-08-13 NOTE — Progress Notes (Signed)
Assessment and Plan:   Sonya Peterson was seen today for follow-up.  Diagnoses and all orders for this visit:  Essential hypertension Continue current medications: Telmisartan 80mg  daily Monitor blood pressure at home; call if consistently over 130/80 Continue DASH diet.   Reminder to go to the ER if any CP, SOB, nausea, dizziness, severe HA, changes vision/speech, left arm numbness and tingling and jaw pain. -     CBC with Differential/Platelet -     COMPLETE METABOLIC PANEL WITH GFR  Hyperlipidemia, unspecified hyperlipidemia type Continue medications:simvastatin 40mg  nightly Discussed dietary and exercise modifications Low fat diet -     Lipid panel  Abnormal glucose Obesity (BMI 30.0-34.9) BMI 34.1 Discussed dietary and exercise modifications  Iron deficiency No supplementation at this time. Defer labs today  Mild intermittent asthma in adult without complication Well controlled Has in date rescue inhaler Continue singular daily  Recurrent major depressive disorder, in partial remission (Sonya Peterson) Continue medications: fluoxetine 20mg  daily Doing well at this time Discussed stress management techniques  Discussed, increase water,intake & good sleep hygiene  Discussed increasing exercise & vegetables in diet   Vitamin D Def - check level and continue medications.   Medication Management Continued  Continue diet and meds as discussed. Further disposition pending results of labs. Future Appointments  Date Time Provider [place]  11/12/2020 11:45 AM Garnet Sierras, NP [facility] None  12/18/2020  3:00 PM Ashyia Schraeder, [place] Sewer, NP [facility] None    HPI 64 y.o. female  presents for 3 month follow up with HTN, HLD, intermittent asthma, depression, prediabetes, weight management and vitamin D deficiency  She reports overall she is doing well.  She does not have any health or medications concerns today.  She is drinking about 2 bottles of water a day, reports she knows she  should be drinking more.  She has history of allergies, asthma, on allergy pill. Has congestion morning and at night. Some wheezing, SOB. She is not on the advair regularly and has used her rescue inhaler one in the past month. CXR 06/2019  Her mom passed 04-17-2020, died at 73. 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   Her blood pressure has not been checked at home, today their BP is BP: 130/70  BP Readings from Last 5 Encounters:  08/13/20 130/70  04/30/20 128/74  12/18/19 128/88  10/23/19 (!) 165/90  06/12/19 122/84   She does not workout.  She denies chest pain, shortness of breath, dizziness.  She is on cholesterol medication.  Zocor 40mg   and denies myalgias. Her cholesterol is at goal. The cholesterol last visit was:   Lab Results  Component Value Date   CHOL 152 08/13/2020   HDL 36 (L) 08/13/2020   LDLCALC 88 08/13/2020   TRIG 182 (H) 08/13/2020   CHOLHDL 4.2 08/13/2020   She has been working on diet and exercise for prediabetes, and denies paresthesia of the feet, polydipsia, polyuria and visual disturbances. Last A1C in the office was:  Lab Results  Component Value Date   HGBA1C 5.6 04/30/2020   Patient is on Vitamin D supplement.   Lab Results  Component Value Date   VD25OH 62 04/30/2020     BMI is Body mass index is 34.11 kg/m., she is struggling with weight loss due to time limitations. Has been continuing to work on this.  Wt Readings from Last 3 Encounters:  08/13/20 205 lb (93 kg)  04/30/20 202 lb (91.6 kg)  12/18/19 203 lb (92.1 kg)  Current Medications:    Current Outpatient Medications (Cardiovascular):  .  simvastatin (ZOCOR) 40 MG tablet, Take 1 tablet at Bedtime for Cholesterol .  telmisartan (MICARDIS) 80 MG tablet, Take 1 tablet Daily for BP  Current Outpatient Medications (Respiratory):  .  albuterol (VENTOLIN HFA) 108 (90 Base) MCG/ACT inhaler, Inhale 2 puffs into the lungs every 4 (four) hours as needed for wheezing  or shortness of breath. .  fluticasone-salmeterol (ADVAIR HFA) 115-21 MCG/ACT inhaler, Inhale 2 puffs into the lungs 2 (two) times daily. (Patient taking differently: Inhale 2 puffs into the lungs 2 (two) times daily as needed. ) .  loratadine (CLARITIN) 10 MG tablet, TAKE 1 TABLET DAILY FOR ALLERGIES .  montelukast (SINGULAIR) 10 MG tablet, Take 1 tablet (10 mg total) by mouth daily.  Current Outpatient Medications (Analgesics):  .  meloxicam (MOBIC) 7.5 MG tablet, Take 1 tablet Daily with Food for Pain & Inflammation   Current Outpatient Medications (Other):  Marland Kitchen  Cholecalciferol (VITAMIN D3) 5000 units TABS, Take 5,000 Units by mouth at bedtime. Marland Kitchen  FLUoxetine (PROZAC) 20 MG capsule, TAKE 3 CAPSULES DAILY FOR MOOD .  tamsulosin (FLOMAX) 0.4 MG CAPS capsule, Take 0.4 mg by mouth. .  urea (CARMOL) 40 % CREA, Apply 1 application topically daily. Apply to feet daily for 2 weeks then use pumice stone  Medical History:  Past Medical History:  Diagnosis Date  . Allergy   . Arthritis   . Bronchitis   . Depression    OCD  . History of kidney stones   . Hyperlipidemia   . Hypertension   . Obesity (BMI 30.0-34.9)   . Prediabetes 11/09/2013  . Vitamin D deficiency    Allergies: No Known Allergies   Review of Systems  Constitutional: Negative for chills, diaphoresis, fever, malaise/fatigue and weight loss.  HENT: Negative.   Eyes: Negative.   Respiratory: Negative for cough, hemoptysis, sputum production, shortness of breath and wheezing.   Cardiovascular: Negative.   Gastrointestinal: Negative.   Genitourinary: Negative.   Musculoskeletal: Negative.   Skin: Negative.   Neurological: Negative.  Negative for weakness.  Endo/Heme/Allergies: Negative.   Psychiatric/Behavioral: Negative.     Names of Other Physician/Practitioners you currently use: 1. [facility] here for primary care 2. Eye doctor, Due for 2021 3. Dental Exam: Due for  2021 Patient Care Team: Unk Pinto, MD as PCP - General (Internal Medicine) Newt Minion, MD as Consulting Physician (Orthopedic Surgery) Richmond Campbell, MD as Consulting Physician (Gastroenterology) Festus Aloe, MD as Consulting Physician (Urology)    Screening Tests: Immunization History  Administered Date(s) Administered  . Influenza Inj Mdck Quad With Preservative 08/16/2017, 09/20/2018, 09/13/2019  . Influenza Split 08/31/2013, 10/04/2014, 10/09/2015  . Influenza, Seasonal, Injecte, Preservative Fre 09/21/2016  . PFIZER SARS-COV-2 Vaccination 02/17/2020, 03/12/2020  . Tdap 03/28/2015    Preventative care: Last colonoscopy: 01/2010, Due discussed with patient Last mammogram: Due 2021 Last pap smear/pelvic exam: Pap 01/2018   DEXA:Due at age 41   Vaccinations: TD or Tdap: 2016  Influenza: Due for 2021 Pneumococcal: Due at age 28 Shingles/Zostavax: Due discussed with patient    Family history- Review and unchanged Social history- Review and unchanged Physical Exam: BP 130/70   Pulse 66   Temp 97.6 F (36.4 C)   Wt 205 lb (93 kg)   SpO2 95%   BMI 34.11 kg/m  Wt Readings from Last 3 Encounters:  08/13/20 205 lb (93 kg)  04/30/20 202 lb (91.6 kg)  12/18/19  203 lb (92.1 kg)   General Appearance: Well nourished, in no apparent distress. Eyes: PERRLA, EOMs, conjunctiva no swelling or erythema Sinuses: No Frontal/maxillary tenderness ENT/Mouth: Ext aud canals clear, TMs without erythema, bulging. No erythema, swelling, or exudate on post pharynx.  Tonsils not swollen or erythematous. Hearing normal.  Neck: Supple, thyroid normal.  Respiratory: Respiratory effort normal, BS equal bilaterally with wheezing without rales, rhonchi, or stridor.  Cardio: RRR with no MRGs. Brisk peripheral pulses without edema.  Abdomen: Soft, + BS.  Non tender, no guarding, rebound, hernias, masses. Lymphatics: Non tender without lymphadenopathy.  Musculoskeletal: Full  ROM, 5/5 strength, normal gait.  Skin:  Warm, dry without rashes, lesions, ecchymosis.  Neuro: Cranial nerves intact. Normal muscle tone, no cerebellar symptoms. Sensation intact.  Psych: Awake and oriented X 3, normal affect, Insight and Judgment appropriate.    Garnet Sierras, NP 2:08 PM [facility]

## 2020-08-13 NOTE — Patient Instructions (Signed)
  Ask insurance and pharmacy about shingrix - it is a 2 part shot that we will not be getting in the office.   Suggest getting AFTER covid vaccines, have to wait at least a month This shot can make you feel bad due to such good immune response it can trigger some inflammation so take tylenol or aleve day of or day after and plan on resting.   Can go to https://www.cdc.gov/vaccines/vpd/shingles/public/shingrix/index.html for more information  Shingrix Vaccination  Two vaccines are licensed and recommended to prevent shingles in the [place] Zoster vaccine live (ZVL, Zostavax) has been in use since 2006. Recombinant zoster vaccine (RZV, Shingrix), has been in use since 2017 and is recommended by AC[employer] as the preferred shingles vaccine.  What Everyone Should Know about Shingles Vaccine (Shingrix) One of the Recommended Vaccines by Disease Shingles vaccination is the only way to protect against shingles and postherpetic neuralgia (PHN), the most common complication from shingles. CDC recommends that healthy adults 50 years and older get two doses of the shingles vaccine called Shingrix (recombinant zoster vaccine), separated by 2 to 6 months, to prevent shingles and the complications from the disease. Your doctor or pharmacist can give you Shingrix as a shot in your upper arm. Shingrix provides strong protection against shingles and PHN. Two doses of Shingrix is more than 90% effective at preventing shingles and PHN. Protection stays above 85% for at least the first four years after you get vaccinated. Shingrix is the preferred vaccine, over Zostavax (zoster vaccine live), a shingles vaccine in use since 2006. Zostavax may still be used to prevent shingles in healthy adults 60 years and older. For example, you could use Zostavax if a person is allergic to Shingrix, prefers Zostavax, or requests immediate vaccination and Shingrix is unavailable. Who Should Get Shingrix? Healthy adults 50 years and older  should get two doses of Shingrix, separated by 2 to 6 months. You should get Shingrix even if in the past you . had shingles  . received Zostavax  . are not sure if you had chickenpox There is no maximum age for getting Shingrix. If you had shingles in the past, you can get Shingrix to help prevent future occurrences of the disease. There is no specific length of time that you need to wait after having shingles before you can receive Shingrix, but generally you should make sure the shingles rash has gone away before getting vaccinated. You can get Shingrix whether or not you remember having had chickenpox in the past. Studies show that more than 99% of Americans 40 years and older have had chickenpox, even if they don't remember having the disease. Chickenpox and shingles are related because they are caused by the same virus (varicella zoster virus). After a person recovers from chickenpox, the virus stays dormant (inactive) in the body. It can reactivate years later and cause shingles. If you had Zostavax in the recent past, you should wait at least eight weeks before getting Shingrix. Talk to your healthcare provider to determine the best time to get Shingrix. Shingrix is available in doctor's offices and pharmacies. To find doctor's offices or pharmacies near you that offer the vaccine, visit HealthMap Vaccine FinderExternal. If you have questions about Shingrix, talk with your healthcare provider. Vaccine for Those 50 Years and Older  Shingrix reduces the risk of shingles and PHN by more than 90% in people 50 and older. CDC recommends the vaccine for healthy adults 50 and older.  Who Should Not Get Shingrix?   You should not get Shingrix if you: . have ever had a severe allergic reaction to any component of the vaccine or after a dose of Shingrix  . tested negative for immunity to varicella zoster virus. If you test negative, you should get chickenpox vaccine.  . currently have shingles   . currently are pregnant or breastfeeding. Women who are pregnant or breastfeeding should wait to get Shingrix.  . receive specific antiviral drugs (acyclovir, famciclovir, or valacyclovir) 24 hours before vaccination (avoid use of these antiviral drugs for 14 days after vaccination)- zoster vaccine live only If you have a minor acute (starts suddenly) illness, such as a cold, you may get Shingrix. But if you have a moderate or severe acute illness, you should usually wait until you recover before getting the vaccine. This includes anyone with a temperature of 101.3F or higher. The side effects of the Shingrix are temporary, and usually last 2 to 3 days. While you may experience pain for a few days after getting Shingrix, the pain will be less severe than having shingles and the complications from the disease. How Well Does Shingrix Work? Two doses of Shingrix provides strong protection against shingles and postherpetic neuralgia (PHN), the most common complication of shingles. . In adults 50 to 62 years old who got two doses, Shingrix was 97% effective in preventing shingles; among adults 70 years and older, Shingrix was 91% effective.  . In adults 50 to 62 years old who got two doses, Shingrix was 91% effective in preventing PHN; among adults 70 years and older, Shingrix was 89% effective. Shingrix protection remained high (more than 85%) in people 70 years and older throughout the four years following vaccination. Since your risk of shingles and PHN increases as you get older, it is important to have strong protection against shingles in your older years. Top of Page  What Are the Possible Side Effects of Shingrix? Studies show that Shingrix is safe. The vaccine helps your body create a strong defense against shingles. As a result, you are likely to have temporary side effects from getting the shots. The side effects may affect your ability to do normal daily activities for 2 to 3 days. Most people  got a sore arm with mild or moderate pain after getting Shingrix, and some also had redness and swelling where they got the shot. Some people felt tired, had muscle pain, a headache, shivering, fever, stomach pain, or nausea. About 1 out of 6 people who got Shingrix experienced side effects that prevented them from doing regular activities. Symptoms went away on their own in about 2 to 3 days. Side effects were more common in younger people. You might have a reaction to the first or second dose of Shingrix, or both doses. If you experience side effects, you may choose to take over-the-counter pain medicine such as ibuprofen or acetaminophen. If you experience side effects from Shingrix, you should report them to the Vaccine Adverse Event Reporting System (VAERS). Your doctor might file this report, or you can do it yourself through the VAERS websiteExternal, or by calling 1-800-822-7967. If you have any questions about side effects from Shingrix, talk with your doctor. The shingles vaccine does not contain thimerosal (a preservative containing mercury). Top of Page  When Should I See a Doctor Because of the Side Effects I Experience From Shingrix? In clinical trials, Shingrix was not associated with serious adverse events. In fact, serious side effects from vaccines are extremely rare. For example, for every   1 million doses of a vaccine given, only one or two people may have a severe allergic reaction. Signs of an allergic reaction happen within minutes or hours after vaccination and include hives, swelling of the face and throat, difficulty breathing, a fast heartbeat, dizziness, or weakness. If you experience these or any other life-threatening symptoms, see a doctor right away. Shingrix causes a strong response in your immune system, so it may produce short-term side effects more intense than you are used to from other vaccines. These side effects can be uncomfortable, but they are expected and usually go  away on their own in 2 or 3 days. Top of Page  How Can I Pay For Shingrix? There are several ways shingles vaccine may be paid for: Medicare . Medicare Part D plans cover the shingles vaccine, but there may be a cost to you depending on your plan. There may be a copay for the vaccine, or you may need to pay in full then get reimbursed for a certain amount.  . Medicare Part B does not cover the shingles vaccine. Medicaid . Medicaid may or may not cover the vaccine. Contact your insurer to find out. Private health insurance . Many private health insurance plans will cover the vaccine, but there may be a cost to you depending on your plan. Contact your insurer to find out. Vaccine assistance programs . Some pharmaceutical companies provide vaccines to eligible adults who cannot afford them. You may want to check with the vaccine manufacturer, [employer], about Shingrix. If you do not currently have health insurance, learn more about affordable health coverage optionsExternal. To find doctor's offices or pharmacies near you that offer the vaccine, visit HealthMap Vaccine FinderExternal.  

## 2020-08-23 ENCOUNTER — Telehealth: Payer: Self-pay | Admitting: *Deleted

## 2020-08-23 NOTE — Telephone Encounter (Signed)
Left a message to inform the patient her labs were mailed to her on 08/23/2020 by Rosita Fire.

## 2020-09-18 ENCOUNTER — Other Ambulatory Visit: Payer: Self-pay

## 2020-09-18 ENCOUNTER — Ambulatory Visit (INDEPENDENT_AMBULATORY_CARE_PROVIDER_SITE_OTHER): Admitting: *Deleted

## 2020-09-18 VITALS — Temp 97.2°F

## 2020-09-18 DIAGNOSIS — Z23 Encounter for immunization: Secondary | ICD-10-CM | POA: Diagnosis not present

## 2020-10-01 ENCOUNTER — Other Ambulatory Visit: Payer: Self-pay

## 2020-10-01 MED ORDER — TELMISARTAN 80 MG PO TABS: ORAL_TABLET | ORAL | 3 refills | Status: DC

## 2020-10-30 ENCOUNTER — Encounter: Admitting: Physician Assistant

## 2020-11-05 ENCOUNTER — Other Ambulatory Visit: Payer: Self-pay | Admitting: Internal Medicine

## 2020-11-05 DIAGNOSIS — E785 Hyperlipidemia, unspecified: Secondary | ICD-10-CM

## 2020-11-12 ENCOUNTER — Ambulatory Visit: Admitting: Adult Health Nurse Practitioner

## 2020-11-18 ENCOUNTER — Ambulatory Visit (INDEPENDENT_AMBULATORY_CARE_PROVIDER_SITE_OTHER): Admitting: Adult Health Nurse Practitioner

## 2020-11-18 ENCOUNTER — Other Ambulatory Visit: Payer: Self-pay

## 2020-11-18 ENCOUNTER — Encounter: Payer: Self-pay | Admitting: Adult Health Nurse Practitioner

## 2020-11-18 VITALS — BP 140/80 | HR 83 | Temp 97.3°F | Ht 65.0 in | Wt 206.6 lb

## 2020-11-18 DIAGNOSIS — F3341 Major depressive disorder, recurrent, in partial remission: Secondary | ICD-10-CM

## 2020-11-18 DIAGNOSIS — J4520 Mild intermittent asthma, uncomplicated: Secondary | ICD-10-CM

## 2020-11-18 DIAGNOSIS — E785 Hyperlipidemia, unspecified: Secondary | ICD-10-CM

## 2020-11-18 DIAGNOSIS — R7309 Other abnormal glucose: Secondary | ICD-10-CM

## 2020-11-18 DIAGNOSIS — I10 Essential (primary) hypertension: Secondary | ICD-10-CM

## 2020-11-18 DIAGNOSIS — E611 Iron deficiency: Secondary | ICD-10-CM

## 2020-11-18 DIAGNOSIS — Z79899 Other long term (current) drug therapy: Secondary | ICD-10-CM

## 2020-11-18 DIAGNOSIS — E559 Vitamin D deficiency, unspecified: Secondary | ICD-10-CM

## 2020-11-18 NOTE — Progress Notes (Signed)
Assessment and Plan:   Sonya Peterson was seen today for follow-up.  Diagnoses and all orders for this visit:  Essential hypertension Continue current medications: Telmisartan 80mg  daily Monitor blood pressure at home; call if consistently over 130/80 Continue DASH diet.   Reminder to go to the ER if any CP, SOB, nausea, dizziness, severe HA, changes vision/speech, left arm numbness and tingling and jaw pain. -     CBC with Differential/Platelet -     COMPLETE METABOLIC PANEL WITH GFR  Hyperlipidemia, unspecified hyperlipidemia type Continue medications:simvastatin 40mg  nightly Discussed dietary and exercise modifications Low fat diet -     Lipid panel  Abnormal glucose Obesity (BMI 30.0-34.9) BMI 34.1 Discussed dietary and exercise modifications  Mild intermittent asthma in adult without complication Well controlled Encouraged daily Advair use Has in date rescue inhaler Continue singular daily  Recurrent major depressive disorder, in partial remission ([place]) Continue medications: fluoxetine 20mg  daily Doing well at this time Discussed stress management techniques  Discussed, increase water,intake & good sleep hygiene  Discussed increasing exercise & vegetables in diet   Vitamin D Def - check level and continue medications.   Medication Management Continued  Continue diet and meds as discussed. Further disposition pending results of labs. Future Appointments  Date Time Provider [place]  02/17/2021  2:00 PM Garnet Sierras, NP [facility] None    HPI 64 y.o. female  presents for 3 month follow up with HTN, HLD, intermittent asthma, depression, prediabetes, weight management and vitamin D deficiency  She reports overall she is doing well.  She does not have any health or medications concerns today.  She is drinking about 2 bottles of water a day, reports she knows she should be drinking more.  She has history of allergies, asthma, on allergy pill. Has congestion  morning and at night. Some wheezing, SOB. She is not on the advair regularly and has used her rescue inhaler one in the past month. CXR 06/2019  Her mom passed 04-04-20, died at 92. [name] graduated the next Friday from pharmacy school.   Her blood pressure has not been checked at home, today their BP is BP: 140/80  BP Readings from Last 5 Encounters:  11/18/20 140/80  08/13/20 130/70  04/30/20 128/74  12/18/19 128/88  10/23/19 (!) 165/90   She does not workout.  She denies chest pain, shortness of breath, dizziness.  She is on cholesterol medication.  sinvastatin 40mg   and denies myalgias. Her cholesterol is at goal. The cholesterol last visit was:   Lab Results  Component Value Date   CHOL 152 08/13/2020   HDL 36 (L) 08/13/2020   LDLCALC 88 08/13/2020   TRIG 182 (H) 08/13/2020   CHOLHDL 4.2 08/13/2020   She has been working on diet and exercise for prediabetes, and denies paresthesia of the feet, polydipsia, polyuria and visual disturbances. Last A1C in the office was:  Lab Results  Component Value Date   HGBA1C 5.6 04/30/2020   Patient is on Vitamin D supplement.   Lab Results  Component Value Date   VD25OH 62 04/30/2020     BMI is Body mass index is 34.38 kg/m., she is struggling with weight loss due to time limitations. Has been continuing to work on this.  Wt Readings from Last 3 Encounters:  11/18/20 206 lb 9.6 oz (93.7 kg)  08/13/20 205 lb (93 kg)  04/30/20 202 lb (91.6 kg)    Current Medications:    Current Outpatient Medications (Cardiovascular):  .  simvastatin (  ZOCOR) 40 MG tablet, TAKE 1 TABLET AT BEDTIME FOR CHOLESTEROL .  telmisartan (MICARDIS) 80 MG tablet, Take 1 tablet Daily for BP  Current Outpatient Medications (Respiratory):  .  albuterol (VENTOLIN HFA) 108 (90 Base) MCG/ACT inhaler, Inhale 2 puffs into the lungs every 4 (four) hours as needed for wheezing or shortness of breath. .  fluticasone-salmeterol (ADVAIR HFA) 115-21 MCG/ACT  inhaler, Inhale 2 puffs into the lungs 2 (two) times daily. (Patient taking differently: Inhale 2 puffs into the lungs 2 (two) times daily as needed.) .  loratadine (CLARITIN) 10 MG tablet, TAKE 1 TABLET DAILY FOR ALLERGIES .  montelukast (SINGULAIR) 10 MG tablet, Take 1 tablet (10 mg total) by mouth daily.  Current Outpatient Medications (Analgesics):  .  meloxicam (MOBIC) 7.5 MG tablet, Take 1 tablet Daily with Food for Pain & Inflammation   Current Outpatient Medications (Other):  Marland Kitchen  Cholecalciferol (VITAMIN D3) 5000 units TABS, Take 5,000 Units by mouth at bedtime. Marland Kitchen  FLUoxetine (PROZAC) 20 MG capsule, TAKE 3 CAPSULES DAILY FOR MOOD .  tamsulosin (FLOMAX) 0.4 MG CAPS capsule, Take 0.4 mg by mouth. .  urea (CARMOL) 40 % CREA, Apply 1 application topically daily. Apply to feet daily for 2 weeks then use pumice stone (Patient not taking: Reported on 11/18/2020)  Medical History:  Past Medical History:  Diagnosis Date  . Allergy   . Arthritis   . Bronchitis   . Depression    OCD  . History of kidney stones   . Hyperlipidemia   . Hypertension   . Obesity (BMI 30.0-34.9)   . Prediabetes 11/09/2013  . Vitamin D deficiency    Allergies: No Known Allergies   Review of Systems  Constitutional: Negative for chills, diaphoresis, fever, malaise/fatigue and weight loss.  HENT: Negative for congestion, ear discharge, ear pain, hearing loss, nosebleeds, sinus pain, sore throat and tinnitus.   Eyes: Negative for blurred vision, double vision, photophobia, pain, discharge and redness.  Respiratory: Negative for cough, hemoptysis, sputum production, shortness of breath, wheezing and stridor.   Cardiovascular: Negative for chest pain, palpitations, orthopnea, claudication, leg swelling and PND.  Gastrointestinal: Negative for abdominal pain, blood in stool, constipation, diarrhea, heartburn, melena, nausea and vomiting.  Genitourinary: Negative for dysuria, flank pain, frequency, hematuria and  urgency.  Musculoskeletal: Negative for back pain, falls, joint pain, myalgias and neck pain.  Skin: Negative for itching and rash.  Neurological: Negative for dizziness, tingling, tremors, sensory change, speech change, focal weakness, seizures, loss of consciousness, weakness and headaches.  Endo/Heme/Allergies: Negative for environmental allergies and polydipsia. Does not bruise/bleed easily.  Psychiatric/Behavioral: Negative for depression, hallucinations, memory loss, substance abuse and suicidal ideas. The patient is not nervous/anxious and does not have insomnia.     Names of Other Physician/Practitioners you currently use: 1. [facility] here for primary care 2. Eye doctor, Due for 2021 3. Dental Exam: Due for 2021 Patient Care Team: Unk Pinto, MD as PCP - General (Internal Medicine) Newt Minion, MD as Consulting Physician (Orthopedic Surgery) Richmond Campbell, MD as Consulting Physician (Gastroenterology) Festus Aloe, MD as Consulting Physician (Urology)    Screening Tests: Immunization History  Administered Date(s) Administered  . Influenza Inj Mdck Quad With Preservative 08/16/2017, 09/20/2018, 09/13/2019, 09/18/2020  . Influenza Split 08/31/2013, 10/04/2014, 10/09/2015  . Influenza, Seasonal, Injecte, Preservative Fre 09/21/2016  . PFIZER SARS-COV-2 Vaccination 02/17/2020, 03/12/2020, 10/28/2020  . Tdap 03/28/2015    Preventative care: Last colonoscopy: 01/2010, Due discussed with patient Last mammogram: Due  2021, discussed with patient Last pap smear/pelvic exam: Pap 01/2018, due 2024 DEXA: Due at age 58   Vaccinations: TD or Tdap: 2016  Influenza:09/2020 Pneumococcal: Due at age 46 Shingles/Zostavax: Due discussed with patient   Family history- Review and unchanged Social history- Review and unchanged  Physical Exam: BP 140/80   Pulse 83   Temp (!) 97.3 F (36.3 C)   Ht 5\' 5"  (1.651 m)   Wt 206 lb  9.6 oz (93.7 kg)   SpO2 94%   BMI 34.38 kg/m  Wt Readings from Last 3 Encounters:  11/18/20 206 lb 9.6 oz (93.7 kg)  08/13/20 205 lb (93 kg)  04/30/20 202 lb (91.6 kg)   General Appearance: Well nourished, in no apparent distress. Eyes: PERRLA, EOMs, conjunctiva no swelling or erythema Sinuses: No Frontal/maxillary tenderness ENT/Mouth: Ext aud canals clear, TMs without erythema, bulging. No erythema, swelling, or exudate on post pharynx.  Tonsils not swollen or erythematous. Hearing normal.  Neck: Supple, thyroid normal.  Respiratory: Respiratory effort normal, BS equal bilaterally with wheezing without rales, rhonchi, or stridor.  Cardio: RRR with no MRGs. Brisk peripheral pulses without edema.  Abdomen: Soft, + BS.  Non tender, no guarding, rebound, hernias, masses. Lymphatics: Non tender without lymphadenopathy.  Musculoskeletal: Full ROM, 5/5 strength, normal gait.  Skin:  Warm, dry without rashes, lesions, ecchymosis.  Neuro: Cranial nerves intact. Normal muscle tone, no cerebellar symptoms. Sensation intact.  Psych: Awake and oriented X 3, normal affect, Insight and Judgment appropriate.     Garnet Sierras, Laqueta Jean, DNP [facility] 11/18/2020  12:36 PM

## 2020-11-18 NOTE — Patient Instructions (Addendum)
We will contact you in 1-3 days with your lab results via telephone.  You are due for Colonoscopy, contact their office to schedule this.  HOW TO SCHEDULE Mammogram  [facility]  7 a.m.-6:30 p.m., Monday 7 a.m.-5 p.m., Tuesday-Friday Schedule an appointment by calling [phone].  There are other locations to get your bone density, DEXA scan.  If you have a preference please let our office know.   Increase the amount of water you drink from 2 to at least 3 or 4 bottles a day.  Use the Advair inhaler every morning.  This will help with the wheezing in your lungs.    GENERAL HEALTH GOALS  Know what a healthy weight is for you (roughly BMI <25) and aim to maintain this  Aim for 7+ servings of fruits and vegetables daily  70-80+ fluid ounces of water or unsweet tea for healthy kidneys  Limit to max 1 drink of alcohol per day; avoid smoking/tobacco  Limit animal fats in diet for cholesterol and heart health - choose grass fed whenever available  Avoid highly processed foods, and foods high in saturated/trans fats  Aim for low stress - take time to unwind and care for your mental health  Aim for 150 min of moderate intensity exercise weekly for heart health, and weights twice weekly for bone health  Aim for 7-9 hours of sleep daily

## 2020-11-19 LAB — LIPID PANEL
Cholesterol: 156 mg/dL (ref <200)
HDL: 34 mg/dL — ABNORMAL LOW (ref >=50)
LDL Cholesterol (Calc): 90 mg/dL (calc)
Non-HDL Cholesterol (Calc): 122 mg/dL (calc) (ref <130)
Total CHOL/HDL Ratio: 4.6 (calc) (ref <5.0)
Triglycerides: 230 mg/dL — ABNORMAL HIGH (ref <150)

## 2020-11-19 LAB — COMPLETE METABOLIC PANEL WITH GFR
AG Ratio: 1.4 (calc) (ref 1.0–2.5)
ALT: 12 U/L (ref 6–29)
AST: 12 U/L (ref 10–35)
Albumin: 4.2 g/dL (ref 3.6–5.1)
Alkaline phosphatase (APISO): 102 U/L (ref 37–153)
BUN: 18 mg/dL (ref 7–25)
CO2: 27 mmol/L (ref 20–32)
Calcium: 9.3 mg/dL (ref 8.6–10.4)
Chloride: 104 mmol/L (ref 98–110)
Creat: 0.78 mg/dL (ref 0.50–0.99)
GFR, Est African American: 94 mL/min/{1.73_m2} (ref >=60)
GFR, Est Non African American: 81 mL/min/{1.73_m2} (ref >=60)
Globulin: 3 g/dL (calc) (ref 1.9–3.7)
Glucose, Bld: 92 mg/dL (ref 65–99)
Potassium: 4.3 mmol/L (ref 3.5–5.3)
Sodium: 139 mmol/L (ref 135–146)
Total Bilirubin: 0.3 mg/dL (ref 0.2–1.2)
Total Protein: 7.2 g/dL (ref 6.1–8.1)

## 2020-11-19 LAB — CBC WITH DIFFERENTIAL/PLATELET
Absolute Monocytes: 650 cells/uL (ref 200–950)
Basophils Absolute: 62 cells/uL (ref 0–200)
Basophils Relative: 0.7 %
Eosinophils Absolute: 490 cells/uL (ref 15–500)
Eosinophils Relative: 5.5 %
HCT: 43.2 % (ref 35.0–45.0)
Hemoglobin: 14.6 g/dL (ref 11.7–15.5)
Lymphs Abs: 2332 cells/uL (ref 850–3900)
MCH: 30 pg (ref 27.0–33.0)
MCHC: 33.8 g/dL (ref 32.0–36.0)
MCV: 88.7 fL (ref 80.0–100.0)
MPV: 9.7 fL (ref 7.5–12.5)
Monocytes Relative: 7.3 %
Neutro Abs: 5367 cells/uL (ref 1500–7800)
Neutrophils Relative %: 60.3 %
Platelets: 316 10*3/uL (ref 140–400)
RBC: 4.87 10*6/uL (ref 3.80–5.10)
RDW: 12.2 % (ref 11.0–15.0)
Total Lymphocyte: 26.2 %
WBC: 8.9 10*3/uL (ref 3.8–10.8)

## 2020-12-10 ENCOUNTER — Other Ambulatory Visit: Payer: Self-pay | Admitting: Internal Medicine

## 2020-12-10 DIAGNOSIS — Z1231 Encounter for screening mammogram for malignant neoplasm of breast: Secondary | ICD-10-CM

## 2020-12-18 ENCOUNTER — Encounter: Admitting: Adult Health Nurse Practitioner

## 2020-12-30 ENCOUNTER — Other Ambulatory Visit: Payer: Self-pay

## 2020-12-30 ENCOUNTER — Ambulatory Visit
Admission: RE | Admit: 2020-12-30 | Discharge: 2020-12-30 | Disposition: A | Discharge status: Home / Self Care | Source: Ambulatory Visit | Attending: Internal Medicine | Admitting: Internal Medicine

## 2020-12-30 DIAGNOSIS — Z1231 Encounter for screening mammogram for malignant neoplasm of breast: Secondary | ICD-10-CM

## 2021-02-17 ENCOUNTER — Other Ambulatory Visit: Payer: Self-pay

## 2021-02-17 ENCOUNTER — Ambulatory Visit (INDEPENDENT_AMBULATORY_CARE_PROVIDER_SITE_OTHER): Admitting: Adult Health Nurse Practitioner

## 2021-02-17 ENCOUNTER — Encounter: Payer: Self-pay | Admitting: Adult Health Nurse Practitioner

## 2021-02-17 VITALS — BP 116/84 | HR 79 | Temp 97.7°F | Resp 16 | Ht 65.0 in | Wt 206.8 lb

## 2021-02-17 DIAGNOSIS — E559 Vitamin D deficiency, unspecified: Secondary | ICD-10-CM

## 2021-02-17 DIAGNOSIS — E785 Hyperlipidemia, unspecified: Secondary | ICD-10-CM

## 2021-02-17 DIAGNOSIS — Z1321 Encounter for screening for nutritional disorder: Secondary | ICD-10-CM

## 2021-02-17 DIAGNOSIS — Z136 Encounter for screening for cardiovascular disorders: Secondary | ICD-10-CM | POA: Diagnosis not present

## 2021-02-17 DIAGNOSIS — Z130 Encounter for screening for diseases of the blood and blood-forming organs and certain disorders involving the immune mechanism: Secondary | ICD-10-CM

## 2021-02-17 DIAGNOSIS — J4520 Mild intermittent asthma, uncomplicated: Secondary | ICD-10-CM

## 2021-02-17 DIAGNOSIS — Z0000 Encounter for general adult medical examination without abnormal findings: Secondary | ICD-10-CM | POA: Diagnosis not present

## 2021-02-17 DIAGNOSIS — Z13220 Encounter for screening for lipoid disorders: Secondary | ICD-10-CM

## 2021-02-17 DIAGNOSIS — R7309 Other abnormal glucose: Secondary | ICD-10-CM

## 2021-02-17 DIAGNOSIS — Z1389 Encounter for screening for other disorder: Secondary | ICD-10-CM | POA: Diagnosis not present

## 2021-02-17 DIAGNOSIS — I10 Essential (primary) hypertension: Secondary | ICD-10-CM

## 2021-02-17 DIAGNOSIS — Z79899 Other long term (current) drug therapy: Secondary | ICD-10-CM

## 2021-02-17 DIAGNOSIS — J4521 Mild intermittent asthma with (acute) exacerbation: Secondary | ICD-10-CM

## 2021-02-17 DIAGNOSIS — Z0001 Encounter for general adult medical examination with abnormal findings: Secondary | ICD-10-CM

## 2021-02-17 DIAGNOSIS — Z1329 Encounter for screening for other suspected endocrine disorder: Secondary | ICD-10-CM

## 2021-02-17 DIAGNOSIS — F3341 Major depressive disorder, recurrent, in partial remission: Secondary | ICD-10-CM

## 2021-02-17 DIAGNOSIS — Z131 Encounter for screening for diabetes mellitus: Secondary | ICD-10-CM

## 2021-02-17 MED ORDER — ADVAIR HFA 115-21 MCG/ACT IN AERO: 2.0000 | INHALATION_SPRAY | Freq: Two times a day (BID) | RESPIRATORY_TRACT | 12 refills | Status: DC

## 2021-02-17 NOTE — Patient Instructions (Addendum)
   You are due for Colonoscopy. Last was in 2011.  Contact GI office to schedule this.   We will call you in 1-3 days with your lab results.  Sooner if urgent.   GENERAL HEALTH GOALS  Know what a healthy weight is for you (roughly BMI <25) and aim to maintain this  Aim for 7+ servings of fruits and vegetables daily  70-80+ fluid ounces of water or unsweet tea for healthy kidneys  Limit to max 1 drink of alcohol per day; avoid smoking/tobacco  Limit animal fats in diet for cholesterol and heart health - choose grass fed whenever available  Avoid highly processed foods, and foods high in saturated/trans fats  Aim for low stress - take time to unwind and care for your mental health  Aim for 150 min of moderate intensity exercise weekly for heart health, and weights twice weekly for bone health  Aim for 7-9 hours of sleep daily

## 2021-02-17 NOTE — Progress Notes (Signed)
COMPLETE PHYSICAL  Assessment and Plan:   Sonya Peterson was seen today for follow-up.  Diagnoses and all orders for this visit:  Encounter for routine medical examination with abnormal findings Yearly  Essential hypertension Continue current medications: Telmisartan 80mg  daily Monitor blood pressure at home; call if consistently over 130/80 Continue DASH diet.   Reminder to go to the ER if any CP, SOB, nausea, dizziness, severe HA, changes vision/speech, left arm numbness and tingling and jaw pain. -     CBC with Differential/Platelet -     COMPLETE METABOLIC PANEL WITH GFR  Hyperlipidemia, unspecified hyperlipidemia type Continue medications:simvastatin 40mg  nightly Discussed dietary and exercise modifications Low fat diet -     Lipid panel  Abnormal glucose Obesity (BMI 30.0-34.9) BMI 34.1 Discussed dietary and exercise modifications  Mild intermittent asthma in adult without complication Well controlled Encouraged daily Advair use Has in date rescue inhaler Continue singular daily  Recurrent major depressive disorder, in partial remission ([place]) Continue medications: fluoxetine 20mg  daily Doing well at this time Discussed stress management techniques  Discussed, increase water,intake & good sleep hygiene  Discussed increasing exercise & vegetables in diet   Vitamin D Def Continue supplementation to maintain goal of 70-100 Taking Vitamin D 5,000 IU daily -vitamin D level  Medication Management Continued  Screening, ischemic heart disease -     EKG 12-Lead  Screening for thyroid disorder -TSH  Encounter for vitamin deficiency screening -     Vitamin B12  Screening for blood or protein in urine -     Urinalysis w microscopic + reflex cultur -     Urine Culture  Mild intermittent asthma with acute exacerbation -     fluticasone-salmeterol (ADVAIR HFA) 115-21 MCG/ACT inhaler; Inhale 2 puffs into the lungs 2 (two) times daily.    Continue diet and meds as  discussed. Further disposition pending results of labs.   Further disposition pending results if labs check today. Discussed med's effects and SE's.   Over 30 minutes of face to face interview, exam, counseling, chart review, and critical decision making was performed.   Future Appointments  Date Time Provider [place]  02/17/2022  2:00 PM Mansour Balboa, Danton Sewer, NP [facility] None    HPI 65 y.o. female  presents for 3 month follow up with HTN, HLD, intermittent asthma, depression, prediabetes, weight management and vitamin D deficiency  She reports overall she is doing well.  She does not have any health or medications concerns today.  She is drinking about 2 bottles of water a day, reports she knows she should be drinking more.  She has history of allergies, asthma, on allergy pill. Has congestion morning and at night. Some wheezing, SOB. She is not on the advair regularly and has used her rescue inhaler one in the past month. CXR 06/2019 She is using advair once a day.  Discussed using this BID.  Patient rinses mouth after use.  Her mom passed Apr 03, 2020, died at 16. Daughter, Sonya Peterson works at [employer]  Her blood pressure has not been checked at home, today their BP is BP: 116/84  BP Readings from Last 5 Encounters:  02/17/21 116/84  11/18/20 140/80  08/13/20 130/70  04/30/20 128/74  12/18/19 128/88   She does not workout.  She denies chest pain, shortness of breath, dizziness.  She is on cholesterol medication.  simvastatin 40mg  nightly  and denies myalgias. Her cholesterol is at goal. The cholesterol last visit was:   Lab Results  Component Value Date  CHOL 156 11/18/2020   HDL 34 (L) 11/18/2020   LDLCALC 90 11/18/2020   TRIG 230 (H) 11/18/2020   CHOLHDL 4.6 11/18/2020   She has been working on diet and exercise for prediabetes, and denies paresthesia of the feet, polydipsia, polyuria and visual disturbances. Last A1C in the office was:  Lab Results   Component Value Date   HGBA1C 5.6 04/30/2020   Patient is on Vitamin D supplement.   Lab Results  Component Value Date   VD25OH 62 04/30/2020     BMI is Body mass index is 34.41 kg/m., she is struggling with weight loss due to time limitations. Has been continuing to work on this.  Wt Readings from Last 3 Encounters:  02/17/21 206 lb 12.8 oz (93.8 kg)  11/18/20 206 lb 9.6 oz (93.7 kg)  08/13/20 205 lb (93 kg)    Current Medications:    Current Outpatient Medications (Cardiovascular):  .  simvastatin (ZOCOR) 40 MG tablet, TAKE 1 TABLET AT BEDTIME FOR CHOLESTEROL .  telmisartan (MICARDIS) 80 MG tablet, Take 1 tablet Daily for BP  Current Outpatient Medications (Respiratory):  .  albuterol (VENTOLIN HFA) 108 (90 Base) MCG/ACT inhaler, Inhale 2 puffs into the lungs every 4 (four) hours as needed for wheezing or shortness of breath. .  loratadine (CLARITIN) 10 MG tablet, TAKE 1 TABLET DAILY FOR ALLERGIES .  montelukast (SINGULAIR) 10 MG tablet, Take 1 tablet (10 mg total) by mouth daily. .  fluticasone-salmeterol (ADVAIR HFA) 115-21 MCG/ACT inhaler, Inhale 2 puffs into the lungs 2 (two) times daily.  Current Outpatient Medications (Analgesics):  .  meloxicam (MOBIC) 7.5 MG tablet, Take 1 tablet Daily with Food for Pain & Inflammation   Current Outpatient Medications (Other):  Marland Kitchen  Cholecalciferol (VITAMIN D3) 5000 units TABS, Take 5,000 Units by mouth at bedtime. Marland Kitchen  FLUoxetine (PROZAC) 20 MG capsule, TAKE 3 CAPSULES DAILY FOR MOOD .  urea (CARMOL) 40 % CREA, Apply 1 application topically daily. Apply to feet daily for 2 weeks then use pumice stone  Medical History:  Past Medical History:  Diagnosis Date  . Allergy   . Arthritis   . Bronchitis   . Depression    OCD  . History of kidney stones   . Hyperlipidemia   . Hypertension   . Obesity (BMI 30.0-34.9)   . Prediabetes 11/09/2013  . Vitamin D deficiency    Allergies: No Known Allergies   Review of Systems   Constitutional: Negative for chills, diaphoresis, fever, malaise/fatigue and weight loss.  HENT: Negative for congestion, ear discharge, ear pain, hearing loss, nosebleeds, sinus pain, sore throat and tinnitus.   Eyes: Negative for blurred vision, double vision, photophobia, pain, discharge and redness.  Respiratory: Negative for cough, hemoptysis, sputum production, shortness of breath, wheezing and stridor.   Cardiovascular: Negative for chest pain, palpitations, orthopnea, claudication, leg swelling and PND.  Gastrointestinal: Negative for abdominal pain, blood in stool, constipation, diarrhea, heartburn, melena, nausea and vomiting.  Genitourinary: Negative for dysuria, flank pain, frequency, hematuria and urgency.  Musculoskeletal: Negative for back pain, falls, joint pain, myalgias and neck pain.  Skin: Negative for itching and rash.  Neurological: Negative for dizziness, tingling, tremors, sensory change, speech change, focal weakness, seizures, loss of consciousness, weakness and headaches.  Endo/Heme/Allergies: Negative for environmental allergies and polydipsia. Does not bruise/bleed easily.  Psychiatric/Behavioral: Negative for depression, hallucinations, memory loss, substance abuse and suicidal ideas. The patient is not nervous/anxious and does not have insomnia.     Names of Other  Physician/Practitioners you currently use: 1. [facility] here for primary care 2. Eye doctor,  [phone] 3. Dental Exam: Due for 2022 Patient Care Team: Unk Pinto, MD as PCP - General (Internal Medicine) Newt Minion, MD as Consulting Physician (Orthopedic Surgery) Richmond Campbell, MD as Consulting Physician (Gastroenterology) Festus Aloe, MD as Consulting Physician (Urology)    Screening Tests: Immunization History  Administered Date(s) Administered  . Influenza Inj Mdck Quad With Preservative 08/16/2017, 09/20/2018, 09/13/2019, 09/18/2020  .  Influenza Split 08/31/2013, 10/04/2014, 10/09/2015  . Influenza, Seasonal, Injecte, Preservative Fre 09/21/2016  . PFIZER(Purple Top)SARS-COV-2 Vaccination 02/17/2020, 03/12/2020, 10/28/2020  . Tdap 03/28/2015    Preventative care: Last colonoscopy: 01/2010, Due discussed with patient to call Last mammogram: 12/2020  Last pap smear/pelvic exam: Pap 01/2018, due 2024 DEXA: Due at age 44   Vaccinations: TD or Tdap: 2016  Influenza:09/2020 Pneumococcal: Due at age 76 Shingles/Zostavax: Shingrix: 01/2021, second scheduled, [facility]   Family history- Review and unchanged Social history- Review and unchanged  Physical Exam: BP 116/84   Pulse 79   Temp 97.7 F (36.5 C)   Resp 16   Ht 5\' 5"  (1.651 m)   Wt 206 lb 12.8 oz (93.8 kg)   SpO2 98%   BMI 34.41 kg/m  Wt Readings from Last 3 Encounters:  02/17/21 206 lb 12.8 oz (93.8 kg)  11/18/20 206 lb 9.6 oz (93.7 kg)  08/13/20 205 lb (93 kg)   General Appearance: Well nourished, in no apparent distress. Eyes: PERRLA, EOMs, conjunctiva no swelling or erythema Sinuses: No Frontal/maxillary tenderness ENT/Mouth: Ext aud canals clear, TMs without erythema, bulging. No erythema, swelling, or exudate on post pharynx.  Tonsils not swollen or erythematous. Hearing normal.  Neck: Supple, thyroid normal.  Respiratory: Respiratory effort normal, BS equal bilaterally without rales, rhonchi, wheezing or stridor.  Cardio: RRR with no MRGs. Brisk peripheral pulses without edema.  Abdomen: Soft, + BS.  Non tender, no guarding, rebound, hernias, masses. Lymphatics: Non tender without lymphadenopathy.  Musculoskeletal: Full ROM, 5/5 strength, Normal gait Skin: Warm, dry without rashes, lesions, ecchymosis.  Neuro: Cranial nerves intact. No cerebellar symptoms.  Psych: Awake and oriented X 3, normal affect, Insight and Judgment appropriate.   EKG: No ST changes   Bayard Males, DNP [facility] 02/17/2021  3:41 PM

## 2021-02-19 LAB — URINALYSIS W MICROSCOPIC + REFLEX CULTURE
Bacteria, UA: NONE SEEN /HPF
Bilirubin Urine: NEGATIVE
Glucose, UA: NEGATIVE
Hgb urine dipstick: NEGATIVE
Hyaline Cast: NONE SEEN /LPF
Ketones, ur: NEGATIVE
Nitrites, Initial: NEGATIVE
Protein, ur: NEGATIVE
Specific Gravity, Urine: 1.026 (ref 1.001–1.03)
pH: <=5 (ref 5.0–8.0)

## 2021-02-19 LAB — URINE CULTURE
MICRO NUMBER:: 11647249
SPECIMEN QUALITY:: ADEQUATE

## 2021-02-19 LAB — COMPLETE METABOLIC PANEL WITH GFR
AG Ratio: 1.5 (calc) (ref 1.0–2.5)
ALT: 11 U/L (ref 6–29)
AST: 12 U/L (ref 10–35)
Albumin: 4.5 g/dL (ref 3.6–5.1)
Alkaline phosphatase (APISO): 101 U/L (ref 37–153)
BUN: 17 mg/dL (ref 7–25)
CO2: 27 mmol/L (ref 20–32)
Calcium: 9.5 mg/dL (ref 8.6–10.4)
Chloride: 104 mmol/L (ref 98–110)
Creat: 0.62 mg/dL (ref 0.50–0.99)
GFR, Est African American: 112 mL/min/{1.73_m2} (ref >=60)
GFR, Est Non African American: 97 mL/min/{1.73_m2} (ref >=60)
Globulin: 3 g/dL (calc) (ref 1.9–3.7)
Glucose, Bld: 76 mg/dL (ref 65–99)
Potassium: 4.1 mmol/L (ref 3.5–5.3)
Sodium: 140 mmol/L (ref 135–146)
Total Bilirubin: 0.3 mg/dL (ref 0.2–1.2)
Total Protein: 7.5 g/dL (ref 6.1–8.1)

## 2021-02-19 LAB — CBC WITH DIFFERENTIAL/PLATELET
Absolute Monocytes: 733 cells/uL (ref 200–950)
Basophils Absolute: 85 cells/uL (ref 0–200)
Basophils Relative: 0.9 %
Eosinophils Absolute: 508 cells/uL — ABNORMAL HIGH (ref 15–500)
Eosinophils Relative: 5.4 %
HCT: 43.8 % (ref 35.0–45.0)
Hemoglobin: 14.6 g/dL (ref 11.7–15.5)
Lymphs Abs: 2453 cells/uL (ref 850–3900)
MCH: 29.9 pg (ref 27.0–33.0)
MCHC: 33.3 g/dL (ref 32.0–36.0)
MCV: 89.8 fL (ref 80.0–100.0)
MPV: 9.5 fL (ref 7.5–12.5)
Monocytes Relative: 7.8 %
Neutro Abs: 5621 cells/uL (ref 1500–7800)
Neutrophils Relative %: 59.8 %
Platelets: 329 10*3/uL (ref 140–400)
RBC: 4.88 10*6/uL (ref 3.80–5.10)
RDW: 12.4 % (ref 11.0–15.0)
Total Lymphocyte: 26.1 %
WBC: 9.4 10*3/uL (ref 3.8–10.8)

## 2021-02-19 LAB — LIPID PANEL
Cholesterol: 159 mg/dL (ref <200)
HDL: 36 mg/dL — ABNORMAL LOW (ref >=50)
LDL Cholesterol (Calc): 99 mg/dL (calc)
Non-HDL Cholesterol (Calc): 123 mg/dL (calc) (ref <130)
Total CHOL/HDL Ratio: 4.4 (calc) (ref <5.0)
Triglycerides: 144 mg/dL (ref <150)

## 2021-02-19 LAB — HEMOGLOBIN A1C
Hgb A1c MFr Bld: 6 % of total Hgb — ABNORMAL HIGH (ref <5.7)
Mean Plasma Glucose: 126 mg/dL
eAG (mmol/L): 7 mmol/L

## 2021-02-19 LAB — VITAMIN D 25 HYDROXY (VIT D DEFICIENCY, FRACTURES): Vit D, 25-Hydroxy: 71 ng/mL (ref 30–100)

## 2021-02-19 LAB — CULTURE INDICATED

## 2021-02-19 LAB — TSH: TSH: 0.85 mIU/L (ref 0.40–4.50)

## 2021-02-19 LAB — VITAMIN B12: Vitamin B-12: 334 pg/mL (ref 200–1100)

## 2021-02-19 LAB — MAGNESIUM: Magnesium: 2.2 mg/dL (ref 1.5–2.5)

## 2021-04-18 ENCOUNTER — Other Ambulatory Visit: Payer: Self-pay | Admitting: *Deleted

## 2021-04-18 DIAGNOSIS — T7840XA Allergy, unspecified, initial encounter: Secondary | ICD-10-CM

## 2021-04-18 MED ORDER — MONTELUKAST SODIUM 10 MG PO TABS: 10.0000 mg | ORAL_TABLET | Freq: Every day | ORAL | 0 refills | Status: DC

## 2021-04-18 MED ORDER — MONTELUKAST SODIUM 10 MG PO TABS: 10.0000 mg | ORAL_TABLET | Freq: Every day | ORAL | 2 refills | Status: DC

## 2021-04-19 ENCOUNTER — Other Ambulatory Visit: Payer: Self-pay | Admitting: Internal Medicine

## 2021-04-19 DIAGNOSIS — T7840XA Allergy, unspecified, initial encounter: Secondary | ICD-10-CM

## 2021-04-19 MED ORDER — MONTELUKAST SODIUM 10 MG PO TABS: ORAL_TABLET | ORAL | 3 refills | Status: DC

## 2021-06-03 ENCOUNTER — Other Ambulatory Visit: Payer: Self-pay | Admitting: Adult Health

## 2021-06-03 DIAGNOSIS — F3341 Major depressive disorder, recurrent, in partial remission: Secondary | ICD-10-CM

## 2021-06-23 ENCOUNTER — Other Ambulatory Visit: Payer: Self-pay | Admitting: Internal Medicine

## 2021-06-23 DIAGNOSIS — T7840XA Allergy, unspecified, initial encounter: Secondary | ICD-10-CM

## 2021-09-16 ENCOUNTER — Ambulatory Visit (INDEPENDENT_AMBULATORY_CARE_PROVIDER_SITE_OTHER)

## 2021-09-16 ENCOUNTER — Other Ambulatory Visit: Payer: Self-pay

## 2021-09-16 VITALS — Temp 97.7°F

## 2021-09-16 DIAGNOSIS — Z23 Encounter for immunization: Secondary | ICD-10-CM | POA: Diagnosis not present

## 2021-09-24 ENCOUNTER — Other Ambulatory Visit: Payer: Self-pay | Admitting: Internal Medicine

## 2021-09-26 ENCOUNTER — Other Ambulatory Visit: Payer: Self-pay | Admitting: Adult Health

## 2021-10-28 ENCOUNTER — Other Ambulatory Visit: Payer: Self-pay | Admitting: Adult Health

## 2021-10-28 DIAGNOSIS — E785 Hyperlipidemia, unspecified: Secondary | ICD-10-CM

## 2022-01-09 ENCOUNTER — Other Ambulatory Visit: Payer: Self-pay | Admitting: Internal Medicine

## 2022-01-09 DIAGNOSIS — Z1231 Encounter for screening mammogram for malignant neoplasm of breast: Secondary | ICD-10-CM

## 2022-01-27 ENCOUNTER — Ambulatory Visit
Admission: RE | Admit: 2022-01-27 | Discharge: 2022-01-27 | Disposition: A | Discharge status: Home / Self Care | Source: Ambulatory Visit | Attending: Internal Medicine | Admitting: Internal Medicine

## 2022-01-27 DIAGNOSIS — Z1231 Encounter for screening mammogram for malignant neoplasm of breast: Secondary | ICD-10-CM

## 2022-02-16 NOTE — Progress Notes (Unsigned)
COMPLETE PHYSICAL  Assessment and Plan:   Sonya Peterson was seen today for follow-up.  Diagnoses and all orders for this visit:  Encounter for routine medical examination with abnormal findings Yearly  Essential hypertension Continue current medications: Telmisartan '80mg'$  daily Monitor blood pressure at home; call if consistently over 130/80 Continue DASH diet.   Reminder to go to the ER if any CP, SOB, nausea, dizziness, severe HA, changes vision/speech, left arm numbness and tingling and jaw pain. -     CBC with Differential/Platelet -     COMPLETE METABOLIC PANEL WITH GFR  Hyperlipidemia, unspecified hyperlipidemia type Continue medications:simvastatin '40mg'$  nightly Discussed dietary and exercise modifications Low fat diet -     Lipid panel  Abnormal glucose Obesity (BMI 30.0-34.9) BMI 34.1 Discussed dietary and exercise modifications  Mild intermittent asthma in adult without complication Well controlled Encouraged daily Advair use Has in date rescue inhaler Continue singular daily  Recurrent major depressive disorder, in partial remission ([place]) Continue medications: fluoxetine '20mg'$  daily Doing well at this time Discussed stress management techniques  Discussed, increase water,intake & good sleep hygiene  Discussed increasing exercise & vegetables in diet   Vitamin D Def Continue supplementation to maintain goal of 70-100 Taking Vitamin D 5,000 IU daily -vitamin D level  Medication Management Continued  Screening, ischemic heart disease -     EKG 12-Lead  Screening for thyroid disorder -TSH  Encounter for vitamin deficiency screening -     Vitamin B12  Screening for blood or protein in urine -     Urinalysis w microscopic + reflex cultur -     Urine Culture  Mild intermittent asthma with acute exacerbation -     fluticasone-salmeterol (ADVAIR HFA) 115-21 MCG/ACT inhaler; Inhale 2 puffs into the lungs 2 (two) times daily.    Continue diet and meds as  discussed. Further disposition pending results of labs.   Further disposition pending results if labs check today. Discussed med's effects and SE's.   Over 30 minutes of face to face interview, exam, counseling, chart review, and critical decision making was performed.   Future Appointments  Date Time Provider [place]  02/17/2022  2:00 PM Magda Bernheim, NP [facility] None    HPI 66 y.o. female  presents for 3 month follow up with HTN, HLD, intermittent asthma, depression, prediabetes, weight management and vitamin D deficiency  She reports overall she is doing well.  She does not have any health or medications concerns today.  She is drinking about 2 bottles of water a day, reports she knows she should be drinking more.  She has history of allergies, asthma, on allergy pill. Has congestion morning and at night. Some wheezing, SOB. She is not on the advair regularly and has used her rescue inhaler one in the past month. CXR 06/2019 She is using advair once a day.  Discussed using this BID.  Patient rinses mouth after use.  Her mom passed Apr 13, 2020, died at 34. Daughter, Sonya Peterson works at [employer]  Her blood pressure has not been checked at home, today their BP is    BP Readings from Last 5 Encounters:  02/17/21 116/84  11/18/20 140/80  08/13/20 130/70  04/30/20 128/74  12/18/19 128/88   She does not workout.  She denies chest pain, shortness of breath, dizziness.  She is on cholesterol medication.  simvastatin '40mg'$  nightly  and denies myalgias. Her cholesterol is at goal. The cholesterol last visit was:   Lab Results  Component Value  Date   CHOL 159 02/17/2021   HDL 36 (L) 02/17/2021   LDLCALC 99 02/17/2021   TRIG 144 02/17/2021   CHOLHDL 4.4 02/17/2021   She has been working on diet and exercise for prediabetes, and denies paresthesia of the feet, polydipsia, polyuria and visual disturbances. Last A1C in the office was:  Lab Results  Component Value Date    HGBA1C 6.0 (H) 02/17/2021   Patient is on Vitamin D supplement.   Lab Results  Component Value Date   VD25OH 71 02/17/2021     BMI is There is no height or weight on file to calculate BMI., she is struggling with weight loss due to time limitations. Has been continuing to work on this.  Wt Readings from Last 3 Encounters:  02/17/21 206 lb 12.8 oz (93.8 kg)  11/18/20 206 lb 9.6 oz (93.7 kg)  08/13/20 205 lb (93 kg)    Current Medications:    Current Outpatient Medications (Cardiovascular):    simvastatin (ZOCOR) 40 MG tablet, TAKE 1 TABLET AT BEDTIME FOR CHOLESTEROL   telmisartan (MICARDIS) 80 MG tablet, TAKE 1 TABLET DAILY FOR BLOOD PRESSURE  Current Outpatient Medications (Respiratory):    albuterol (VENTOLIN HFA) 108 (90 Base) MCG/ACT inhaler, Inhale 2 puffs into the lungs every 4 (four) hours as needed for wheezing or shortness of breath.   fluticasone-salmeterol (ADVAIR HFA) 115-21 MCG/ACT inhaler, Inhale 2 puffs into the lungs 2 (two) times daily.   loratadine (CLARITIN) 10 MG tablet, TAKE 1 TABLET DAILY FOR ALLERGIES   montelukast (SINGULAIR) 10 MG tablet, Take  1 tablet  Daily  for Allergies  Current Outpatient Medications (Analgesics):    meloxicam (MOBIC) 7.5 MG tablet, Take 1 tablet Daily with Food for Pain & Inflammation   Current Outpatient Medications (Other):    Cholecalciferol (VITAMIN D3) 5000 units TABS, Take 5,000 Units by mouth at bedtime.   FLUoxetine (PROZAC) 20 MG capsule, TAKE 3 CAPSULES DAILY FOR MOOD   urea (CARMOL) 40 % CREA, Apply 1 application topically daily. Apply to feet daily for 2 weeks then use pumice stone  Medical History:  Past Medical History:  Diagnosis Date   Allergy    Arthritis    Bronchitis    Depression    OCD   History of kidney stones    Hyperlipidemia    Hypertension    Obesity (BMI 30.0-34.9)    Prediabetes 11/09/2013   Vitamin D deficiency    Allergies: No Known Allergies   Review of Systems  Constitutional:   Negative for chills and fever.  HENT:  Negative for congestion, hearing loss, sinus pain, sore throat and tinnitus.   Eyes:  Negative for blurred vision and double vision.  Respiratory:  Negative for cough, hemoptysis, sputum production, shortness of breath and wheezing.   Cardiovascular:  Negative for chest pain, palpitations and leg swelling.  Gastrointestinal:  Negative for abdominal pain, constipation, diarrhea, heartburn, nausea and vomiting.  Genitourinary:  Negative for dysuria and urgency.  Musculoskeletal:  Negative for back pain, falls, joint pain, myalgias and neck pain.  Skin:  Negative for rash.  Neurological:  Negative for dizziness, tingling, tremors, weakness and headaches.  Endo/Heme/Allergies:  Does not bruise/bleed easily.  Psychiatric/Behavioral:  Negative for depression and suicidal ideas. The patient is not nervous/anxious and does not have insomnia.    Names of Other Physician/Practitioners you currently use: 1. [facility] here for primary care 2. Eye doctor,  [phone] 3. Dental Exam: Due for 2022 Patient Care  Team: Unk Pinto, MD as PCP - General (Internal Medicine) Newt Minion, MD as Consulting Physician (Orthopedic Surgery) Richmond Campbell, MD as Consulting Physician (Gastroenterology) Festus Aloe, MD as Consulting Physician (Urology)    Screening Tests: Immunization History  Administered Date(s) Administered   Influenza Inj Mdck Quad With Preservative 08/16/2017, 09/20/2018, 09/13/2019, 09/18/2020   Influenza Split 08/31/2013, 10/04/2014, 10/09/2015   Influenza, Seasonal, Injecte, Preservative Fre 09/21/2016   Influenza,inj,Quad PF,6+ Mos 09/16/2021   PFIZER(Purple Top)SARS-COV-2 Vaccination 02/17/2020, 03/12/2020, 10/28/2020   Tdap 03/28/2015    Preventative care: Last colonoscopy: 01/2010, Due discussed with patient to call Last mammogram: 12/2020  Last pap smear/pelvic exam: Pap 01/2018, due  2024 DEXA: Due at age 44   Vaccinations: TD or Tdap: 2016  Influenza:09/2020 Pneumococcal: Due at age 72 Shingles/Zostavax: Shingrix: 01/2021, second scheduled, [facility]   Family history- Review and unchanged Social history- Review and unchanged  Physical Exam: There were no vitals taken for this visit. Wt Readings from Last 3 Encounters:  02/17/21 206 lb 12.8 oz (93.8 kg)  11/18/20 206 lb 9.6 oz (93.7 kg)  08/13/20 205 lb (93 kg)   General Appearance: Well nourished, in no apparent distress. Eyes: PERRLA, EOMs, conjunctiva no swelling or erythema Sinuses: No Frontal/maxillary tenderness ENT/Mouth: Ext aud canals clear, TMs without erythema, bulging. No erythema, swelling, or exudate on post pharynx.  Tonsils not swollen or erythematous. Hearing normal.  Neck: Supple, thyroid normal.  Respiratory: Respiratory effort normal, BS equal bilaterally without rales, rhonchi, wheezing or stridor.  Cardio: RRR with no MRGs. Brisk peripheral pulses without edema.  Abdomen: Soft, + BS.  Non tender, no guarding, rebound, hernias, masses. Lymphatics: Non tender without lymphadenopathy.  Musculoskeletal: Full ROM, 5/5 strength, Normal gait Skin: Warm, dry without rashes, lesions, ecchymosis.  Neuro: Cranial nerves intact. No cerebellar symptoms.  Psych: Awake and oriented X 3, normal affect, Insight and Judgment appropriate.   EKG: No ST changes   Bayard Males, DNP [facility] 02/16/2022  12:25 PM

## 2022-02-17 ENCOUNTER — Other Ambulatory Visit: Payer: Self-pay

## 2022-02-17 ENCOUNTER — Encounter: Payer: Self-pay | Admitting: Nurse Practitioner

## 2022-02-17 ENCOUNTER — Ambulatory Visit (INDEPENDENT_AMBULATORY_CARE_PROVIDER_SITE_OTHER): Admitting: Nurse Practitioner

## 2022-02-17 VITALS — BP 124/80 | HR 100 | Temp 97.5°F | Ht 65.0 in | Wt 198.0 lb

## 2022-02-17 DIAGNOSIS — R7309 Other abnormal glucose: Secondary | ICD-10-CM

## 2022-02-17 DIAGNOSIS — F3341 Major depressive disorder, recurrent, in partial remission: Secondary | ICD-10-CM

## 2022-02-17 DIAGNOSIS — I10 Essential (primary) hypertension: Secondary | ICD-10-CM | POA: Diagnosis not present

## 2022-02-17 DIAGNOSIS — Z0000 Encounter for general adult medical examination without abnormal findings: Secondary | ICD-10-CM | POA: Diagnosis not present

## 2022-02-17 DIAGNOSIS — Z1389 Encounter for screening for other disorder: Secondary | ICD-10-CM | POA: Diagnosis not present

## 2022-02-17 DIAGNOSIS — Z13220 Encounter for screening for lipoid disorders: Secondary | ICD-10-CM

## 2022-02-17 DIAGNOSIS — E669 Obesity, unspecified: Secondary | ICD-10-CM

## 2022-02-17 DIAGNOSIS — Z79899 Other long term (current) drug therapy: Secondary | ICD-10-CM | POA: Diagnosis not present

## 2022-02-17 DIAGNOSIS — Z1329 Encounter for screening for other suspected endocrine disorder: Secondary | ICD-10-CM

## 2022-02-17 DIAGNOSIS — Z131 Encounter for screening for diabetes mellitus: Secondary | ICD-10-CM

## 2022-02-17 DIAGNOSIS — T7840XD Allergy, unspecified, subsequent encounter: Secondary | ICD-10-CM

## 2022-02-17 DIAGNOSIS — E785 Hyperlipidemia, unspecified: Secondary | ICD-10-CM

## 2022-02-17 DIAGNOSIS — E559 Vitamin D deficiency, unspecified: Secondary | ICD-10-CM

## 2022-02-17 DIAGNOSIS — Z136 Encounter for screening for cardiovascular disorders: Secondary | ICD-10-CM

## 2022-02-17 DIAGNOSIS — E66811 Obesity, class 1: Secondary | ICD-10-CM

## 2022-02-17 DIAGNOSIS — Z0001 Encounter for general adult medical examination with abnormal findings: Secondary | ICD-10-CM

## 2022-02-17 DIAGNOSIS — J4520 Mild intermittent asthma, uncomplicated: Secondary | ICD-10-CM

## 2022-02-17 DIAGNOSIS — Z1211 Encounter for screening for malignant neoplasm of colon: Secondary | ICD-10-CM

## 2022-02-17 NOTE — Patient Instructions (Signed)
Referral is placed to Dr. Liliane Channel office for colonoscopy ? ?GENERAL HEALTH GOALS ?  ?Know what a healthy weight is for you (roughly BMI <25) and aim to maintain this ?  ?Aim for 7+ servings of fruits and vegetables daily ?  ?70-80+ fluid ounces of water or unsweet tea for healthy kidneys ?  ?Limit to max 1 drink of alcohol per day; avoid smoking/tobacco ?  ?Limit animal fats in diet for cholesterol and heart health - choose grass fed whenever available ?  ?Avoid highly processed foods, and foods high in saturated/trans fats ?  ?Aim for low stress - take time to unwind and care for your mental health ?  ?Aim for 150 min of moderate intensity exercise weekly for heart health, and weights twice weekly for bone health ?  ?Aim for 7-9 hours of sleep daily  ?

## 2022-02-18 LAB — COMPLETE METABOLIC PANEL WITH GFR
AG Ratio: 1.4 (calc) (ref 1.0–2.5)
ALT: 13 U/L (ref 6–29)
AST: 14 U/L (ref 10–35)
Albumin: 4.5 g/dL (ref 3.6–5.1)
Alkaline phosphatase (APISO): 100 U/L (ref 37–153)
BUN: 20 mg/dL (ref 7–25)
CO2: 28 mmol/L (ref 20–32)
Calcium: 9.7 mg/dL (ref 8.6–10.4)
Chloride: 103 mmol/L (ref 98–110)
Creat: 0.93 mg/dL (ref 0.50–1.05)
Globulin: 3.2 g/dL (calc) (ref 1.9–3.7)
Glucose, Bld: 81 mg/dL (ref 65–99)
Potassium: 4.4 mmol/L (ref 3.5–5.3)
Sodium: 141 mmol/L (ref 135–146)
Total Bilirubin: 0.3 mg/dL (ref 0.2–1.2)
Total Protein: 7.7 g/dL (ref 6.1–8.1)
eGFR: 69 mL/min/{1.73_m2} (ref >=60)

## 2022-02-18 LAB — URINALYSIS, ROUTINE W REFLEX MICROSCOPIC
Bacteria, UA: NONE SEEN /HPF
Bilirubin Urine: NEGATIVE
Glucose, UA: NEGATIVE
Hgb urine dipstick: NEGATIVE
Nitrite: NEGATIVE
Specific Gravity, Urine: 1.028 (ref 1.001–1.035)
pH: <=5 (ref 5.0–8.0)

## 2022-02-18 LAB — MICROSCOPIC MESSAGE

## 2022-02-18 LAB — CBC WITH DIFFERENTIAL/PLATELET
Absolute Monocytes: 752 cells/uL (ref 200–950)
Basophils Absolute: 124 cells/uL (ref 0–200)
Basophils Relative: 1.2 %
Eosinophils Absolute: 783 cells/uL — ABNORMAL HIGH (ref 15–500)
Eosinophils Relative: 7.6 %
HCT: 46.1 % — ABNORMAL HIGH (ref 35.0–45.0)
Hemoglobin: 15.3 g/dL (ref 11.7–15.5)
Lymphs Abs: 2369 cells/uL (ref 850–3900)
MCH: 29.4 pg (ref 27.0–33.0)
MCHC: 33.2 g/dL (ref 32.0–36.0)
MCV: 88.7 fL (ref 80.0–100.0)
MPV: 9.7 fL (ref 7.5–12.5)
Monocytes Relative: 7.3 %
Neutro Abs: 6273 cells/uL (ref 1500–7800)
Neutrophils Relative %: 60.9 %
Platelets: 334 10*3/uL (ref 140–400)
RBC: 5.2 10*6/uL — ABNORMAL HIGH (ref 3.80–5.10)
RDW: 12.8 % (ref 11.0–15.0)
Total Lymphocyte: 23 %
WBC: 10.3 10*3/uL (ref 3.8–10.8)

## 2022-02-18 LAB — LIPID PANEL
Cholesterol: 167 mg/dL (ref <200)
HDL: 38 mg/dL — ABNORMAL LOW (ref >=50)
LDL Cholesterol (Calc): 98 mg/dL (calc)
Non-HDL Cholesterol (Calc): 129 mg/dL (calc) (ref <130)
Total CHOL/HDL Ratio: 4.4 (calc) (ref <5.0)
Triglycerides: 217 mg/dL — ABNORMAL HIGH (ref <150)

## 2022-02-18 LAB — VITAMIN D 25 HYDROXY (VIT D DEFICIENCY, FRACTURES): Vit D, 25-Hydroxy: 62 ng/mL (ref 30–100)

## 2022-02-18 LAB — MICROALBUMIN / CREATININE URINE RATIO
Creatinine, Urine: 280 mg/dL — ABNORMAL HIGH (ref 20–275)
Microalb Creat Ratio: 8 mcg/mg creat (ref <30)
Microalb, Ur: 2.3 mg/dL

## 2022-02-18 LAB — HEMOGLOBIN A1C
Hgb A1c MFr Bld: 5.8 % of total Hgb — ABNORMAL HIGH (ref <5.7)
Mean Plasma Glucose: 120 mg/dL
eAG (mmol/L): 6.6 mmol/L

## 2022-02-18 LAB — TSH: TSH: 0.79 mIU/L (ref 0.40–4.50)

## 2022-02-18 LAB — MAGNESIUM: Magnesium: 2.1 mg/dL (ref 1.5–2.5)

## 2022-02-25 ENCOUNTER — Other Ambulatory Visit: Payer: Self-pay | Admitting: Nurse Practitioner

## 2022-02-25 ENCOUNTER — Telehealth: Payer: Self-pay | Admitting: Nurse Practitioner

## 2022-02-25 DIAGNOSIS — T7840XD Allergy, unspecified, subsequent encounter: Secondary | ICD-10-CM

## 2022-02-25 MED ORDER — CETIRIZINE HCL 10 MG PO TABS
10.0000 mg | ORAL_TABLET | Freq: Every day | ORAL | 3 refills | Status: DC
Start: 2022-02-25 — End: 2023-02-18

## 2022-02-25 NOTE — Telephone Encounter (Signed)
I sent the prescription in to the pharmacy and I did make the referral

## 2022-02-25 NOTE — Telephone Encounter (Signed)
Patient is requesting Zyrtec be sent in to [facility]. Also wants to know if ref. Was sent to Dr. Earlean Shawl for her Colonoscopy. -e welch ?

## 2022-05-29 ENCOUNTER — Other Ambulatory Visit: Payer: Self-pay | Admitting: Internal Medicine

## 2022-05-29 DIAGNOSIS — F3341 Major depressive disorder, recurrent, in partial remission: Secondary | ICD-10-CM

## 2022-06-12 ENCOUNTER — Encounter: Payer: Self-pay | Admitting: Gastroenterology

## 2022-06-29 ENCOUNTER — Other Ambulatory Visit: Payer: Self-pay | Admitting: Internal Medicine

## 2022-06-29 DIAGNOSIS — T7840XA Allergy, unspecified, initial encounter: Secondary | ICD-10-CM

## 2022-07-16 ENCOUNTER — Encounter: Payer: Self-pay | Admitting: Gastroenterology

## 2022-07-16 ENCOUNTER — Ambulatory Visit (INDEPENDENT_AMBULATORY_CARE_PROVIDER_SITE_OTHER): Payer: PRIVATE HEALTH INSURANCE | Admitting: Gastroenterology

## 2022-07-16 VITALS — BP 130/76 | HR 79 | Ht 66.5 in | Wt 195.0 lb

## 2022-07-16 DIAGNOSIS — Z1211 Encounter for screening for malignant neoplasm of colon: Secondary | ICD-10-CM

## 2022-07-16 MED ORDER — CLENPIQ 10-3.5-12 MG-GM -GM/175ML PO SOLN: 1.0000 | Freq: Once | ORAL | 0 refills | Status: AC

## 2022-07-16 NOTE — Progress Notes (Signed)
Chief Complaint: Colon cancer screening   Referring Provider:     Unk Pinto, MD    HPI:     Sonya Peterson is a 66 y.o. female with history of HTN, hyperlipidemia, mild asthma, depression, referred to the Gastroenterology Clinic for evaluation of colon cancer screening.  Previously followed with Dr. Earlean Shawl.  Last colonoscopy was 01/16/2010 and notable for hemorrhoids per patient.  She does not think there were any polyps removed.  No report available for review in EMR.  She is otherwise without any active GI symptoms.  No change in bowel habits, hematochezia, melena.  Does not take any anticoagulation or antiplatelet therapy. No issue tolerating sedation in the past.   PGM with colon cancer. Otherwise, no known family history of CRC, GI malignancy, liver disease, pancreatic disease, or IBD.    -10/28/2018: CT A/P: Normal appearing GI tract, liver, pancreas, spleen.  Pelvic floor laxity with cystocele.     Latest Ref Rng & Units 02/17/2022    2:35 PM 02/17/2021    3:19 PM 11/18/2020   12:47 PM  CBC  WBC 3.8 - 10.8 Thousand/uL 10.3  9.4  8.9   Hemoglobin 11.7 - 15.5 g/dL 15.3  14.6  14.6   Hematocrit 35.0 - 45.0 % 46.1  43.8  43.2   Platelets 140 - 400 Thousand/uL 334  329  316       Latest Ref Rng & Units 02/17/2022    2:35 PM 02/17/2021    3:19 PM 11/18/2020   12:47 PM  CMP  Glucose 65 - 99 mg/dL 81  76  92   BUN 7 - 25 mg/dL '20  17  18   '$ Creatinine 0.50 - 1.05 mg/dL 0.93  0.62  0.78   Sodium 135 - 146 mmol/L 141  140  139   Potassium 3.5 - 5.3 mmol/L 4.4  4.1  4.3   Chloride 98 - 110 mmol/L 103  104  104   CO2 20 - 32 mmol/L '28  27  27   '$ Calcium 8.6 - 10.4 mg/dL 9.7  9.5  9.3   Total Protein 6.1 - 8.1 g/dL 7.7  7.5  7.2   Total Bilirubin 0.2 - 1.2 mg/dL 0.3  0.3  0.3   AST 10 - 35 U/L '14  12  12   '$ ALT 6 - 29 U/L '13  11  12       '$ Past Medical History:  Diagnosis Date   Allergy    Anxiety    Arthritis    Asthma    Bronchitis     Depression    OCD   History of hepatitis B    History of kidney stones    Hyperlipidemia    Hypertension    Obesity (BMI 30.0-34.9)    Prediabetes 11/09/2013   Vitamin D deficiency      Past Surgical History:  Procedure Laterality Date   ACHILLES TENDON SURGERY     BACK SURGERY     BREAST SURGERY Left    lump removed left   DILATION AND CURETTAGE OF UTERUS     EXTRACORPOREAL SHOCK WAVE LITHOTRIPSY Left 10/23/2019   Procedure: EXTRACORPOREAL SHOCK WAVE LITHOTRIPSY (ESWL);  Surgeon: Festus Aloe, MD;  Location: [facility];  Service: Urology;  Laterality: Left;   HEEL SPUR SURGERY Left 2015   I & D EXTREMITY Right 08/06/2017   Procedure: IRRIGATION AND DEBRIDEMENT RIGHT ACHILLES;  Surgeon: Sharol Given,  Illene Regulus, MD;  Location: [facility];  Service: Orthopedics;  Laterality: Right;   LUMBAR DISC SURGERY  2004   TUBAL LIGATION     Family History  Problem Relation Age of Onset   Cancer Mother 86       breast   Hypertension Mother    Diabetes Mother    Heart disease Mother    Cancer Father 68       prostate   Heart disease Maternal Grandfather        heart failure   Colon cancer Paternal Grandmother    Crohn's disease Daughter    Social History   Tobacco Use   Smoking status: Never   Smokeless tobacco: Never  Vaping Use   Vaping Use: Never used  Substance Use Topics   Alcohol use: No    Comment: ocassionaly   Drug use: No   Current Outpatient Medications  Medication Sig Dispense Refill   cetirizine (ZYRTEC ALLERGY) 10 MG tablet Take 1 tablet (10 mg total) by mouth daily. 90 tablet 3   Cholecalciferol (VITAMIN D3) 5000 units TABS Take 5,000 Units by mouth at bedtime.     FLUoxetine (PROZAC) 20 MG capsule TAKE 3 CAPSULES DAILY FOR MOOD (Patient taking differently: Take 60 mg by mouth daily. Take 3 capsules Daily for Mood) 270 capsule 3   montelukast (SINGULAIR) 10 MG tablet TAKE 1 TABLET DAILY FOR ALLERGIES 90 tablet 3   simvastatin (ZOCOR) 40 MG tablet TAKE 1 TABLET AT BEDTIME  FOR CHOLESTEROL 90 tablet 3   telmisartan (MICARDIS) 80 MG tablet TAKE 1 TABLET DAILY FOR BLOOD PRESSURE (Patient taking differently: Take 40 mg by mouth daily. TAKE 0.5 TABLET DAILY FOR BLOOD PRESSURE) 90 tablet 3   albuterol (VENTOLIN HFA) 108 (90 Base) MCG/ACT inhaler Inhale 2 puffs into the lungs every 4 (four) hours as needed for wheezing or shortness of breath. (Patient not taking: Reported on 07/16/2022) 1 Inhaler 0   fluticasone-salmeterol (ADVAIR HFA) 115-21 MCG/ACT inhaler Inhale 2 puffs into the lungs 2 (two) times daily. (Patient not taking: Reported on 07/16/2022) 1 each 12   No current facility-administered medications for this visit.   No Known Allergies   Review of Systems: All systems reviewed and negative except where noted in HPI.     Physical Exam:    Wt Readings from Last 3 Encounters:  07/16/22 195 lb (88.5 kg)  02/17/22 198 lb (89.8 kg)  02/17/21 206 lb 12.8 oz (93.8 kg)    BP 130/76   Pulse 79   Ht 5' 6.5" (1.689 m)   Wt 195 lb (88.5 kg)   BMI 31.00 kg/m  Constitutional:  Pleasant, in no acute distress. Psychiatric: Normal mood and affect. Behavior is normal. Cardiovascular: Normal rate, regular rhythm. No edema Pulmonary/chest: Effort normal and breath sounds normal. No wheezing, rales or rhonchi. Abdominal: Soft, nondistended, nontender. Bowel sounds active throughout. There are no masses palpable. No hepatomegaly. Neurological: Alert and oriented to person place and time. Skin: Skin is warm and dry. No rashes noted.   ASSESSMENT AND PLAN;   1) Colon Cancer Screening  Sonya Peterson is a 66 y.o. female presenting to the Gastroenterology Clinic for ongoing CRC screening. Family history with remote CRC in PGM, but o/w no FDRs with CRC. She is w/o any active GI sxs.   - Proceed with colonoscopy for ongoing CRC screening - NPO at [facility] prior to procedure  - Bowel prep ordered with plan for instruction with GI clinical staff - All  questions  answered  2) HTN 3) Mild asthma Stable chronic conditions. Ok to proceed with procedure in outpatient [place]  The indications, risks, and benefits of colonoscopy were explained to the patient in detail. Risks include but are not limited to bleeding, perforation, adverse reaction to medications, and cardiopulmonary compromise. Sequelae include but are not limited to the possibility of surgery, hospitalization, and mortality. The patient verbalized understanding and wished to proceed. All questions answered, referred for scheduling and bowel prep ordered. Further recommendations pending results of the exam.      Lavena Bullion, DO, FACG  07/16/2022, 2:15 PM   Unk Pinto, MD

## 2022-07-16 NOTE — Patient Instructions (Addendum)
If you are age 64 or older, your body mass index should be between 23-30. Your Body mass index is 31 kg/m. If this is out of the aforementioned range listed, please consider follow up with your Primary Care Provider.  __________________________________________________________  The [facility] providers would like to encourage you to use [facility] to communicate with providers for non-urgent requests or questions.  Due to long hold times on the telephone, sending your provider a message by [facility] may be a faster and more efficient way to get a response.  Please allow 48 business hours for a response.  Please remember that this is for non-urgent requests.   Due to recent changes in healthcare laws, you may see the results of your imaging and laboratory studies on MyChart before your provider has had a chance to review them.  We understand that in some cases there may be results that are confusing or concerning to you. Not all laboratory results come back in the same time frame and the provider may be waiting for multiple results in order to interpret others.  Please give [place] 48 hours in order for your provider to thoroughly review all the results before contacting the office for clarification of your results.    We have sent the following medications to your pharmacy for you to pick up at your convenience: [email or web address]  You have been scheduled for a colonoscopy. Please follow written instructions given to you at your visit today.  Please pick up your prep supplies at the pharmacy within the next 1-3 days. If you use inhalers (even only as needed), please bring them with you on the day of your procedure.    Thank you for choosing me and [facility].  Vito Cirigliano, D.O.

## 2022-08-25 ENCOUNTER — Encounter: Payer: Self-pay | Admitting: Gastroenterology

## 2022-08-25 ENCOUNTER — Ambulatory Visit (AMBULATORY_SURGERY_CENTER): Admitting: Gastroenterology

## 2022-08-25 VITALS — BP 155/84 | HR 69 | Temp 96.0°F | Resp 18 | Ht 66.0 in | Wt 195.0 lb

## 2022-08-25 DIAGNOSIS — D128 Benign neoplasm of rectum: Secondary | ICD-10-CM | POA: Diagnosis not present

## 2022-08-25 DIAGNOSIS — Z1211 Encounter for screening for malignant neoplasm of colon: Secondary | ICD-10-CM

## 2022-08-25 DIAGNOSIS — D123 Benign neoplasm of transverse colon: Secondary | ICD-10-CM

## 2022-08-25 DIAGNOSIS — D127 Benign neoplasm of rectosigmoid junction: Secondary | ICD-10-CM

## 2022-08-25 DIAGNOSIS — D125 Benign neoplasm of sigmoid colon: Secondary | ICD-10-CM

## 2022-08-25 DIAGNOSIS — K635 Polyp of colon: Secondary | ICD-10-CM

## 2022-08-25 MED ORDER — SODIUM CHLORIDE 0.9 % IV SOLN: 500.0000 mL | Freq: Once | INTRAVENOUS | Status: DC

## 2022-08-25 NOTE — Op Note (Signed)
[place] Patient Name: Sonya Peterson Procedure Date: 08/25/2022 8:26 AM MRN: [id number] Endoscopist: Gerrit Heck , MD Age: 66 Referring MD:  Date of Birth: 09/14/1956 Gender: Female Account #: [id number] Procedure:                Colonoscopy Indications:              Screening for colorectal malignant neoplas. Last                            colonoscopy was more than 10 years ago (2011) and                            no polyps per patient. Medicines:                Monitored Anesthesia Care Procedure:                Pre-Anesthesia Assessment:                           - Prior to the procedure, a History and Physical                            was performed, and patient medications and                            allergies were reviewed. The patient's tolerance of                            previous anesthesia was also reviewed. The risks                            and benefits of the procedure and the sedation                            options and risks were discussed with the patient.                            All questions were answered, and informed consent                            was obtained. Prior Anticoagulants: The patient has                            taken no previous anticoagulant or antiplatelet                            agents. ASA Grade Assessment: II - A patient with                            mild systemic disease. After reviewing the risks                            and benefits, the patient was deemed in  satisfactory condition to undergo the procedure.                           After obtaining informed consent, the colonoscope                            was passed under direct vision. Throughout the                            procedure, the patient's blood pressure, pulse, and                            oxygen saturations were monitored continuously. The                            Olympus CF-HQ190L ([id number])  Colonoscope was                            introduced through the anus and advanced to the the                            terminal ileum. The colonoscopy was performed                            without difficulty. The patient tolerated the                            procedure well. The quality of the bowel                            preparation was good. The terminal ileum, ileocecal                            valve, appendiceal orifice, and rectum were                            photographed. Scope In: 8:37:44 AM Scope Out: 8:57:37 AM Scope Withdrawal Time: 0 hours 16 minutes 22 seconds  Total Procedure Duration: 0 hours 19 minutes 53 seconds  Findings:                 Skin tags were found on perianal exam.                           A 6 mm polyp was found in the transverse colon. The                            polyp was sessile with adherent mucus cap. The                            polyp was removed with a cold snare. Resection and                            retrieval were complete. Estimated blood loss was  minimal.                           A 8 mm polyp was found in the sigmoid colon. The                            polyp was sessile. The polyp was removed with a                            cold snare. Resection and retrieval were complete.                            Estimated blood loss was minimal.                           Three sessile polyps were found in the rectum and                            sigmoid colon. The polyps were 3 to 6 mm in size.                            These polyps were removed with a cold snare.                            Resection and retrieval were complete. Estimated                            blood loss was minimal.                           The retroflexed view of the distal rectum and anal                            verge was normal and showed no anal or rectal                            abnormalities.                            The terminal ileum appeared normal. Complications:            No immediate complications. Estimated Blood Loss:     Estimated blood loss was minimal. Impression:               - Perianal skin tags found on perianal exam.                           - One 6 mm polyp in the transverse colon, removed                            with a cold snare. Resected and retrieved.                           - One 8 mm polyp in the sigmoid colon, removed with  a cold snare. Resected and retrieved.                           - Three 3 to 6 mm polyps in the rectum and in the                            sigmoid colon, removed with a cold snare. Resected                            and retrieved.                           - The distal rectum and anal verge are normal on                            retroflexion view.                           - The examined portion of the ileum was normal. Recommendation:           - Patient has a contact number available for                            emergencies. The signs and symptoms of potential                            delayed complications were discussed with the                            patient. Return to normal activities tomorrow.                            Written discharge instructions were provided to the                            patient.                           - Resume previous diet.                           - Continue present medications.                           - Await pathology results.                           - Repeat colonoscopy for surveillance based on                            pathology results.                           - Return to GI office PRN. Gerrit Heck, MD 08/25/2022 9:03:56 AM

## 2022-08-25 NOTE — Progress Notes (Signed)
Pt's states no medical or surgical changes since previsit or office visit. 

## 2022-08-25 NOTE — Patient Instructions (Signed)
YOU HAD AN ENDOSCOPIC PROCEDURE TODAY AT [facility]:   Refer to the procedure report that was given to you for any specific questions about what was found during the examination.  If the procedure report does not answer your questions, please call your gastroenterologist to clarify.  If you requested that your care partner not be given the details of your procedure findings, then the procedure report has been included in a sealed envelope for you to review at your convenience later.  YOU SHOULD EXPECT: Some feelings of bloating in the abdomen. Passage of more gas than usual.  Walking can help get rid of the air that was put into your GI tract during the procedure and reduce the bloating. If you had a lower endoscopy (such as a colonoscopy or flexible sigmoidoscopy) you may notice spotting of blood in your stool or on the toilet paper. If you underwent a bowel prep for your procedure, you may not have a normal bowel movement for a few days.  Please Note:  You might notice some irritation and congestion in your nose or some drainage.  This is from the oxygen used during your procedure.  There is no need for concern and it should clear up in a day or so.  SYMPTOMS TO REPORT IMMEDIATELY:  Following lower endoscopy (colonoscopy or flexible sigmoidoscopy):  Excessive amounts of blood in the stool  Significant tenderness or worsening of abdominal pains  Swelling of the abdomen that is new, acute  Fever of 100F or higher   For urgent or emergent issues, a gastroenterologist can be reached at any hour by calling [phone]. Do not use MyChart messaging for urgent concerns.    DIET:  We do recommend a small meal at first, but then you may proceed to your regular diet.  Drink plenty of fluids but you should avoid alcoholic beverages for 24 hours.  MEDICATIONS: Continue present medications.  Return to the GI office as needed.  Please see handouts given to you by your recovery  nurse.  Thank you for allowing [place] to provide for your healthcare needs today.  ACTIVITY:  You should plan to take it easy for the rest of today and you should NOT DRIVE or use heavy machinery until tomorrow (because of the sedation medicines used during the test).    FOLLOW UP: Our staff will call the number listed on your records the next business day following your procedure.  We will call around 7:15- 8:00 am to check on you and address any questions or concerns that you may have regarding the information given to you following your procedure. If we do not reach you, we will leave a message.     If any biopsies were taken you will be contacted by phone or by letter within the next 1-3 weeks.  Please call us at [phone] if you have not heard about the biopsies in 3 weeks.    SIGNATURES/CONFIDENTIALITY: You and/or your care partner have signed paperwork which will be entered into your electronic medical record.  These signatures attest to the fact that that the information above on your After Visit Summary has been reviewed and is understood.  Full responsibility of the confidentiality of this discharge information lies with you and/or your care-partner.

## 2022-08-25 NOTE — Progress Notes (Signed)
GASTROENTEROLOGY PROCEDURE H&P NOTE   Primary Care Physician: Unk Pinto, MD    Reason for Procedure:  Colon Cancer screening  Plan:    Colonoscopy  Patient is appropriate for endoscopic procedure(s) in the ambulatory ([place]) setting.  The nature of the procedure, as well as the risks, benefits, and alternatives were carefully and thoroughly reviewed with the patient. Ample time for discussion and questions allowed. The patient understood, was satisfied, and agreed to proceed.     HPI: Sonya Peterson is a 66 y.o. female who presents for colonoscopy for contiued Colon Cancer screening.  No active GI symptoms.    Last colonoscopy was 01/16/2010 and notable for hemorrhoids per patient.  She does not think there were any polyps removed.  No report available for review in EMR.   PGM with colon cancer. Otherwise, no known family history of CRC, GI malignancy, liver disease, pancreatic disease, or IBD.   Past Medical History:  Diagnosis Date   Allergy    Anxiety    Arthritis    Asthma    Bronchitis    Depression    OCD   History of hepatitis B    History of kidney stones    Hyperlipidemia    Hypertension    Obesity (BMI 30.0-34.9)    Prediabetes 11/09/2013   Vitamin D deficiency     Past Surgical History:  Procedure Laterality Date   ACHILLES TENDON SURGERY     BACK SURGERY     BREAST SURGERY Left    lump removed left   DILATION AND CURETTAGE OF UTERUS     EXTRACORPOREAL SHOCK WAVE LITHOTRIPSY Left 10/23/2019   Procedure: EXTRACORPOREAL SHOCK WAVE LITHOTRIPSY (ESWL);  Surgeon: Festus Aloe, MD;  Location: [facility];  Service: Urology;  Laterality: Left;   HEEL SPUR SURGERY Left 2015   I & D EXTREMITY Right 08/06/2017   Procedure: IRRIGATION AND DEBRIDEMENT RIGHT ACHILLES;  Surgeon: Newt Minion, MD;  Location: [facility];  Service: Orthopedics;  Laterality: Right;   LUMBAR DISC SURGERY  2004   TUBAL LIGATION      Prior to Admission medications   Medication  Sig Start Date End Date Taking? Authorizing Provider  cetirizine (ZYRTEC ALLERGY) 10 MG tablet Take 1 tablet (10 mg total) by mouth daily. 02/25/22 02/25/23 Yes Alycia Rossetti, NP  Cholecalciferol (VITAMIN D3) 5000 units TABS Take 5,000 Units by mouth at bedtime.   Yes [provider]  FLUoxetine (PROZAC) 20 MG capsule TAKE 3 CAPSULES DAILY FOR MOOD Patient taking differently: Take 60 mg by mouth daily. Take 3 capsules Daily for Mood 05/29/22  Yes Unk Pinto, MD  montelukast (SINGULAIR) 10 MG tablet TAKE 1 TABLET DAILY FOR ALLERGIES 06/29/22  Yes Alycia Rossetti, NP  simvastatin (ZOCOR) 40 MG tablet TAKE 1 TABLET AT BEDTIME FOR CHOLESTEROL 10/28/21  Yes Liane Comber, NP  telmisartan (MICARDIS) 80 MG tablet TAKE 1 TABLET DAILY FOR BLOOD PRESSURE Patient taking differently: Take 40 mg by mouth daily. TAKE 0.5 TABLET DAILY FOR BLOOD PRESSURE 09/26/21  Yes Liane Comber, NP  albuterol (VENTOLIN HFA) 108 (90 Base) MCG/ACT inhaler Inhale 2 puffs into the lungs every 4 (four) hours as needed for wheezing or shortness of breath. Patient not taking: Reported on 07/16/2022 04/25/19   Vladimir Crofts, PA-C  fluticasone-salmeterol (ADVAIR [facility]) [phone] MCG/ACT inhaler Inhale 2 puffs into the lungs 2 (two) times daily. Patient not taking: Reported on 07/16/2022 02/17/21   Garnet Sierras, NP    Current Outpatient Medications  Medication Sig Dispense Refill   cetirizine (ZYRTEC ALLERGY) 10 MG tablet Take 1 tablet (10 mg total) by mouth daily. 90 tablet 3   Cholecalciferol (VITAMIN D3) 5000 units TABS Take 5,000 Units by mouth at bedtime.     FLUoxetine (PROZAC) 20 MG capsule TAKE 3 CAPSULES DAILY FOR MOOD (Patient taking differently: Take 60 mg by mouth daily. Take 3 capsules Daily for Mood) 270 capsule 3   montelukast (SINGULAIR) 10 MG tablet TAKE 1 TABLET DAILY FOR ALLERGIES 90 tablet 3   simvastatin (ZOCOR) 40 MG tablet TAKE 1 TABLET AT BEDTIME FOR CHOLESTEROL 90 tablet 3   telmisartan  (MICARDIS) 80 MG tablet TAKE 1 TABLET DAILY FOR BLOOD PRESSURE (Patient taking differently: Take 40 mg by mouth daily. TAKE 0.5 TABLET DAILY FOR BLOOD PRESSURE) 90 tablet 3   albuterol (VENTOLIN HFA) 108 (90 Base) MCG/ACT inhaler Inhale 2 puffs into the lungs every 4 (four) hours as needed for wheezing or shortness of breath. (Patient not taking: Reported on 07/16/2022) 1 Inhaler 0   fluticasone-salmeterol (ADVAIR HFA) 115-21 MCG/ACT inhaler Inhale 2 puffs into the lungs 2 (two) times daily. (Patient not taking: Reported on 07/16/2022) 1 each 12   Current Facility-Administered Medications  Medication Dose Route Frequency Provider Last Rate Last Admin   0.9 %  sodium chloride infusion  500 mL Intravenous Once Raygen Dahm V, DO        Allergies as of 08/25/2022   (No Known Allergies)    Family History  Problem Relation Age of Onset   Cancer Mother 5       breast   Hypertension Mother    Diabetes Mother    Heart disease Mother    Cancer Father 93       prostate   Heart disease Maternal Grandfather        heart failure   Colon cancer Paternal Grandmother    Crohn's disease Daughter     Social History   Socioeconomic History   Marital status: Married    Spouse name: Not on file   Number of children: Not on file   Years of education: Not on file   Highest education level: Not on file  Occupational History   Not on file  Tobacco Use   Smoking status: Never   Smokeless tobacco: Never  Vaping Use   Vaping Use: Never used  Substance and Sexual Activity   Alcohol use: No    Comment: ocassionaly   Drug use: No   Sexual activity: Not on file  Other Topics Concern   Not on file  Social History Narrative   Not on file   Social Determinants of Health   Financial Resource Strain: Not on file  Food Insecurity: Not on file  Transportation Needs: Not on file  Physical Activity: Not on file  Stress: Not on file  Social Connections: Not on file  Intimate Partner Violence:  Not on file    Physical Exam: Vital signs in last 24 hours: '@BP'$  131/73   Pulse 89   Temp (!) 96 F (35.6 C)   Ht '5\' 6"'$  (1.676 m)   Wt 195 lb (88.5 kg)   SpO2 93%   BMI 31.47 kg/m  GEN: NAD EYE: Sclerae anicteric ENT: MMM CV: Non-tachycardic Pulm: CTA b/l GI: Soft, NT/ND NEURO:  Alert & Oriented x 3   Gerrit Heck, DO [facility]   08/25/2022 8:31 AM

## 2022-08-25 NOTE — Progress Notes (Signed)
Called to room to assist during endoscopic procedure.  Patient ID and intended procedure confirmed with present staff. Received instructions for my participation in the procedure from the performing physician.  

## 2022-08-25 NOTE — Progress Notes (Signed)
PT taken to PACU. Monitors in place. VSS. Report given to RN. 

## 2022-08-26 ENCOUNTER — Telehealth: Payer: Self-pay | Admitting: *Deleted

## 2022-08-26 NOTE — Telephone Encounter (Signed)
Attempted f/u phone call. No answer. Left message. °

## 2022-08-26 NOTE — Progress Notes (Unsigned)
Assessment and Plan:   Sonya Peterson was seen today for follow-up.  Diagnoses and all orders for this visit:  Essential hypertension Continue current medications: Telmisartan '80mg'$  daily Monitor blood pressure at home; call if consistently over 130/80 Continue DASH diet.   Reminder to go to the ER if any CP, SOB, nausea, dizziness, severe HA, changes vision/speech, left arm numbness and tingling and jaw pain. -     CBC with Differential/Platelet -     COMPLETE METABOLIC PANEL WITH GFR  Hyperlipidemia, unspecified hyperlipidemia type Continue medications:simvastatin '40mg'$  nightly Discussed dietary and exercise modifications Low fat diet -     Lipid panel  Abnormal glucose Obesity (BMI 30.0-34.9) BMI 34.1 Discussed dietary and exercise modifications  Mild intermittent asthma in adult without complication Well controlled Encouraged daily Advair use Has in date rescue inhaler Continue singular daily  Recurrent major depressive disorder, in partial remission ([place]) Continue medications: fluoxetine '20mg'$  daily Doing well at this time Discussed stress management techniques  Discussed, increase water,intake & good sleep hygiene  Discussed increasing exercise & vegetables in diet   Vitamin D Def - check level and continue medications.   Medication Management Continued  Continue diet and meds as discussed. Further disposition pending results of labs. Future Appointments  Date Time Provider [place]  08/27/2022 11:00 AM Alycia Rossetti, NP [facility] None  02/18/2023  2:00 PM Alycia Rossetti, NP [facility] None    HPI 67 y.o. female  presents for 3 month follow up with HTN, HLD, intermittent asthma, depression, prediabetes, weight management and vitamin D deficiency  She reports overall she is doing well.  She does not have any health or medications concerns today.  She is drinking about 2 bottles of water a day, reports she knows she should be drinking more.  She has  history of allergies, asthma, on allergy pill. Has congestion morning and at night. Some wheezing, SOB. She is not on the advair regularly and has used her rescue inhaler one in the past month. CXR 06/2019  Her mom passed 04-23-20, died at 67. 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   Her blood pressure has not been checked at home, today their BP is    BP Readings from Last 5 Encounters:  08/25/22 (!) 155/84  07/16/22 130/76  02/17/22 124/80  02/17/21 116/84  11/18/20 140/80   She does not workout.  She denies chest pain, shortness of breath, dizziness.  She is on cholesterol medication.  sinvastatin '40mg'$   and denies myalgias. Her cholesterol is at goal. The cholesterol last visit was:   Lab Results  Component Value Date   CHOL 167 02/17/2022   HDL 38 (L) 02/17/2022   LDLCALC 98 02/17/2022   TRIG 217 (H) 02/17/2022   CHOLHDL 4.4 02/17/2022   She has been working on diet and exercise for prediabetes, and denies paresthesia of the feet, polydipsia, polyuria and visual disturbances. Last A1C in the office was:  Lab Results  Component Value Date   HGBA1C 5.8 (H) 02/17/2022   Patient is on Vitamin D supplement.   Lab Results  Component Value Date   VD25OH 62 02/17/2022     BMI is There is no height or weight on file to calculate BMI., she is struggling with weight loss due to time limitations. Has been continuing to work on this.  Wt Readings from Last 3 Encounters:  08/25/22 195 lb (88.5 kg)  07/16/22 195 lb (88.5 kg)  02/17/22 198 lb (89.8 kg)  Current Medications:     Current Outpatient Medications (Cardiovascular):    simvastatin (ZOCOR) 40 MG tablet, TAKE 1 TABLET AT BEDTIME FOR CHOLESTEROL   telmisartan (MICARDIS) 80 MG tablet, TAKE 1 TABLET DAILY FOR BLOOD PRESSURE (Patient taking differently: Take 40 mg by mouth daily. TAKE 0.5 TABLET DAILY FOR BLOOD PRESSURE)   Current Outpatient Medications (Respiratory):    albuterol (VENTOLIN HFA) 108  (90 Base) MCG/ACT inhaler, Inhale 2 puffs into the lungs every 4 (four) hours as needed for wheezing or shortness of breath. (Patient not taking: Reported on 07/16/2022)   cetirizine (ZYRTEC ALLERGY) 10 MG tablet, Take 1 tablet (10 mg total) by mouth daily.   fluticasone-salmeterol (ADVAIR HFA) 115-21 MCG/ACT inhaler, Inhale 2 puffs into the lungs 2 (two) times daily. (Patient not taking: Reported on 07/16/2022)   montelukast (SINGULAIR) 10 MG tablet, TAKE 1 TABLET DAILY FOR ALLERGIES       Current Outpatient Medications (Other):    Cholecalciferol (VITAMIN D3) 5000 units TABS, Take 5,000 Units by mouth at bedtime.   FLUoxetine (PROZAC) 20 MG capsule, TAKE 3 CAPSULES DAILY FOR MOOD (Patient taking differently: Take 60 mg by mouth daily. Take 3 capsules Daily for Mood)  Current Facility-Administered Medications (Other):    0.9 %  sodium chloride infusion  Medical History:  Past Medical History:  Diagnosis Date   Allergy    Anxiety    Arthritis    Asthma    Bronchitis    Depression    OCD   History of hepatitis B    History of kidney stones    Hyperlipidemia    Hypertension    Obesity (BMI 30.0-34.9)    Prediabetes 11/09/2013   Vitamin D deficiency    Allergies: No Known Allergies   Review of Systems  Constitutional:  Negative for chills, diaphoresis, fever, malaise/fatigue and weight loss.  HENT:  Negative for congestion, ear discharge, ear pain, hearing loss, nosebleeds, sinus pain, sore throat and tinnitus.   Eyes:  Negative for blurred vision, double vision, photophobia, pain, discharge and redness.  Respiratory:  Negative for cough, hemoptysis, sputum production, shortness of breath, wheezing and stridor.   Cardiovascular:  Negative for chest pain, palpitations, orthopnea, claudication, leg swelling and PND.  Gastrointestinal:  Negative for abdominal pain, blood in stool, constipation, diarrhea, heartburn, melena, nausea and vomiting.  Genitourinary:  Negative for  dysuria, flank pain, frequency, hematuria and urgency.  Musculoskeletal:  Negative for back pain, falls, joint pain, myalgias and neck pain.  Skin:  Negative for itching and rash.  Neurological:  Negative for dizziness, tingling, tremors, sensory change, speech change, focal weakness, seizures, loss of consciousness, weakness and headaches.  Endo/Heme/Allergies:  Negative for environmental allergies and polydipsia. Does not bruise/bleed easily.  Psychiatric/Behavioral:  Negative for depression, hallucinations, memory loss, substance abuse and suicidal ideas. The patient is not nervous/anxious and does not have insomnia.     Names of Other Physician/Practitioners you currently use: 1. [facility] here for primary care 2. Eye doctor, Due for 2021 3. Dental Exam: Due for 2021 Patient Care Team: Unk Pinto, MD as PCP - General (Internal Medicine) Newt Minion, MD as Consulting Physician (Orthopedic Surgery) Richmond Campbell, MD as Consulting Physician (Gastroenterology) Festus Aloe, MD as Consulting Physician (Urology)    Screening Tests: Immunization History  Administered Date(s) Administered   Influenza Inj Mdck Quad With Preservative 08/16/2017, 09/20/2018, 09/13/2019, 09/18/2020   Influenza Split 08/31/2013, 10/04/2014, 10/09/2015   Influenza, Seasonal, Injecte, Preservative Fre 09/21/2016  Influenza,inj,Quad PF,6+ Mos 09/16/2021   PFIZER(Purple Top)SARS-COV-2 Vaccination 02/17/2020, 03/12/2020, 10/28/2020   Tdap 03/28/2015    Preventative care: Last colonoscopy: 01/2010, Due discussed with patient Last mammogram: Due 2021, discussed with patient Last pap smear/pelvic exam: Pap 01/2018, due 2024 DEXA: Due at age 71   Vaccinations: TD or Tdap: 2016  Influenza:09/2020 Pneumococcal: Due at age 28 Shingles/Zostavax: Due discussed with patient   Family history- Review and unchanged Social history- Review and  unchanged  Physical Exam: There were no vitals taken for this visit. Wt Readings from Last 3 Encounters:  08/25/22 195 lb (88.5 kg)  07/16/22 195 lb (88.5 kg)  02/17/22 198 lb (89.8 kg)   General Appearance: Well nourished, in no apparent distress. Eyes: PERRLA, EOMs, conjunctiva no swelling or erythema Sinuses: No Frontal/maxillary tenderness ENT/Mouth: Ext aud canals clear, TMs without erythema, bulging. No erythema, swelling, or exudate on post pharynx.  Tonsils not swollen or erythematous. Hearing normal.  Neck: Supple, thyroid normal.  Respiratory: Respiratory effort normal, BS equal bilaterally with wheezing without rales, rhonchi, or stridor.  Cardio: RRR with no MRGs. Brisk peripheral pulses without edema.  Abdomen: Soft, + BS.  Non tender, no guarding, rebound, hernias, masses. Lymphatics: Non tender without lymphadenopathy.  Musculoskeletal: Full ROM, 5/5 strength, normal gait.  Skin:  Warm, dry without rashes, lesions, ecchymosis.  Neuro: Cranial nerves intact. Normal muscle tone, no cerebellar symptoms. Sensation intact.  Psych: Awake and oriented X 3, normal affect, Insight and Judgment appropriate.     Garnet Sierras, Laqueta Jean, DNP [facility] 08/26/2022  9:26 AM

## 2022-08-27 ENCOUNTER — Encounter: Payer: Self-pay | Admitting: Nurse Practitioner

## 2022-08-27 ENCOUNTER — Ambulatory Visit (INDEPENDENT_AMBULATORY_CARE_PROVIDER_SITE_OTHER): Admitting: Nurse Practitioner

## 2022-08-27 VITALS — BP 134/86 | HR 70 | Temp 97.9°F | Ht 66.0 in | Wt 192.4 lb

## 2022-08-27 DIAGNOSIS — E785 Hyperlipidemia, unspecified: Secondary | ICD-10-CM

## 2022-08-27 DIAGNOSIS — E669 Obesity, unspecified: Secondary | ICD-10-CM

## 2022-08-27 DIAGNOSIS — E559 Vitamin D deficiency, unspecified: Secondary | ICD-10-CM

## 2022-08-27 DIAGNOSIS — J4531 Mild persistent asthma with (acute) exacerbation: Secondary | ICD-10-CM

## 2022-08-27 DIAGNOSIS — R7981 Abnormal blood-gas level: Secondary | ICD-10-CM

## 2022-08-27 DIAGNOSIS — I10 Essential (primary) hypertension: Secondary | ICD-10-CM

## 2022-08-27 DIAGNOSIS — R7309 Other abnormal glucose: Secondary | ICD-10-CM | POA: Diagnosis not present

## 2022-08-27 DIAGNOSIS — F3341 Major depressive disorder, recurrent, in partial remission: Secondary | ICD-10-CM

## 2022-08-27 DIAGNOSIS — Z79899 Other long term (current) drug therapy: Secondary | ICD-10-CM

## 2022-08-27 DIAGNOSIS — J4520 Mild intermittent asthma, uncomplicated: Secondary | ICD-10-CM

## 2022-08-27 MED ORDER — LEVOFLOXACIN 750 MG PO TABS: 750.0000 mg | ORAL_TABLET | Freq: Every day | ORAL | 0 refills | Status: AC

## 2022-08-27 MED ORDER — ALBUTEROL SULFATE HFA 108 (90 BASE) MCG/ACT IN AERS: 2.0000 | INHALATION_SPRAY | Freq: Four times a day (QID) | RESPIRATORY_TRACT | 2 refills | Status: AC | PRN

## 2022-08-27 MED ORDER — PREDNISONE 20 MG PO TABS: ORAL_TABLET | ORAL | 0 refills | Status: AC

## 2022-08-27 MED ORDER — BREZTRI AEROSPHERE 160-9-4.8 MCG/ACT IN AERO: 2.0000 | INHALATION_SPRAY | Freq: Two times a day (BID) | RESPIRATORY_TRACT | 3 refills | Status: DC

## 2022-08-27 NOTE — Patient Instructions (Signed)
Stop Advair and start Breztri 2 puffs twice a day Use Albuterol inhaler 2 puffs every 6 hours as needed for wheezing/shortness of breath Prednisone taper Monitor oxygen saturations and if less than 88 go to the ER  If get shortness of breath and feel you cannot get a deep breath please go to ER  Asthma, Adult  Asthma is a long-term (chronic) condition that causes recurrent episodes in which the lower airways in the lungs become tight and narrow. The narrowing is caused by inflammation and tightening of the smooth muscle around the lower airways. Asthma episodes, also called asthma attacks or asthma flares, may cause coughing, making high-pitched whistling sounds when you breathe, most often when you breathe out (wheezing), shortness of breath, and chest pain. The airways may produce extra mucus caused by the inflammation and irritation. During an attack, it can be difficult to breathe. Asthma attacks can range from minor to life-threatening. Asthma cannot be cured, but medicines and lifestyle changes can help control it and treat acute attacks. It is important to keep your asthma well controlled so the condition does not interfere with your daily life. What are the causes? This condition is believed to be caused by inherited (genetic) and environmental factors, but its exact cause is not known. What can trigger an asthma attack? Many things can bring on an asthma attack or make symptoms worse. These triggers are different for every person. Common triggers include: Allergens and irritants like mold, dust, pet dander, cockroaches, pollen, air pollution, and chemical odors. Cigarette smoke. Weather changes and cold air. Stress and strong emotional responses such as crying or laughing hard. Certain medications such as aspirin or beta blockers. Infections and inflammatory conditions, such as the flu, a cold, pneumonia, or inflammation of the nasal membranes (rhinitis). Gastroesophageal reflux  disease (GERD). What are the signs or symptoms? Symptoms may occur right after exposure to an asthma trigger or hours later and can vary by person. Common signs and symptoms include: Wheezing. Trouble breathing (shortness of breath). Excessive nighttime or early morning coughing. Chest tightness. Tiredness (fatigue) with minimal activity. Difficulty talking in complete sentences. Poor exercise tolerance. How is this diagnosed? This condition is diagnosed based on: A physical exam and your medical history. Tests, which may include: Lung function studies to evaluate the flow of air in your lungs. Allergy tests. Imaging tests, such as X-rays. How is this treated? There is no cure, but symptoms can be controlled with proper treatment. Treatment usually involves: Identifying and avoiding your asthma triggers. Inhaled medicines. Two types are commonly used to treat asthma, depending on severity: Controller medicines. These help prevent asthma symptoms from occurring. They are taken every day. Fast-acting reliever or rescue medicines. These quickly relieve asthma symptoms. They are used as needed and provide short-term relief. Using other medicines, such as: Allergy medicines, such as antihistamines, if your asthma attacks are triggered by allergens. Immune medicines (immunomodulators). These are medicines that help control the immune system. Using supplemental oxygen. This is only needed during a severe episode. Creating an asthma action plan. An asthma action plan is a written plan for managing and treating your asthma attacks. This plan includes: A list of your asthma triggers and how to avoid them. Information about when medicines should be taken and when their dosage should be changed. Instructions about using a device called a peak flow meter. A peak flow meter measures how well the lungs are working and the severity of your asthma. It helps you monitor your condition.  Follow these  instructions at home: Take over-the-counter and prescription medicines only as told by your health care provider. Stay up to date on all vaccinations as recommended by your healthcare provider, including vaccines for the flu and pneumonia. Use a peak flow meter and keep track of your peak flow readings. Understand and use your asthma action plan to address any asthma flares. Do not smoke or allow anyone to smoke in your home. Contact a health care provider if: You have wheezing, shortness of breath, or a cough that is not responding to medicines. Your medicines are causing side effects, such as a rash, itching, swelling, or trouble breathing. You need to use a reliever medicine more than 2-3 times a week. Your peak flow reading is still at 50-79% of your personal best after following your action plan for 1 hour. You have a fever and shortness of breath. Get help right away if: You are getting worse and do not respond to treatment during an asthma attack. You are short of breath when at rest or when doing very little physical activity. You have difficulty eating, drinking, or talking. You have chest pain or tightness. You develop a fast heartbeat or palpitations. You have a bluish color to your lips or fingernails. You are light-headed or dizzy, or you faint. Your peak flow reading is less than 50% of your personal best. You feel too tired to breathe normally. These symptoms may be an emergency. Get help right away. Call 911. Do not wait to see if the symptoms will go away. Do not drive yourself to the hospital. Summary Asthma is a long-term (chronic) condition that causes recurrent episodes in which the airways become tight and narrow. Asthma episodes, also called asthma attacks or asthma flares, can cause coughing, wheezing, shortness of breath, and chest pain. Asthma cannot be cured, but medicines and lifestyle changes can help keep it well controlled and prevent asthma flares. Make  sure you understand how to avoid triggers and how and when to use your medicines. Asthma attacks can range from minor to life-threatening. Get help right away if you have an asthma attack and do not respond to treatment with your usual rescue medicines. This information is not intended to replace advice given to you by your health care provider. Make sure you discuss any questions you have with your health care provider. Document Revised: 09/10/2021 Document Reviewed: 09/01/2021 Elsevier Patient Education  [place].

## 2022-08-28 LAB — COMPLETE METABOLIC PANEL WITH GFR
AG Ratio: 1.5 (calc) (ref 1.0–2.5)
ALT: 15 U/L (ref 6–29)
AST: 17 U/L (ref 10–35)
Albumin: 4.5 g/dL (ref 3.6–5.1)
Alkaline phosphatase (APISO): 96 U/L (ref 37–153)
BUN: 16 mg/dL (ref 7–25)
CO2: 27 mmol/L (ref 20–32)
Calcium: 9.3 mg/dL (ref 8.6–10.4)
Chloride: 101 mmol/L (ref 98–110)
Creat: 0.7 mg/dL (ref 0.50–1.05)
Globulin: 3 g/dL (calc) (ref 1.9–3.7)
Glucose, Bld: 84 mg/dL (ref 65–99)
Potassium: 4.4 mmol/L (ref 3.5–5.3)
Sodium: 138 mmol/L (ref 135–146)
Total Bilirubin: 0.4 mg/dL (ref 0.2–1.2)
Total Protein: 7.5 g/dL (ref 6.1–8.1)
eGFR: 97 mL/min/{1.73_m2} (ref >=60)

## 2022-08-28 LAB — LIPID PANEL
Cholesterol: 153 mg/dL (ref <200)
HDL: 38 mg/dL — ABNORMAL LOW (ref >=50)
LDL Cholesterol (Calc): 92 mg/dL (calc)
Non-HDL Cholesterol (Calc): 115 mg/dL (calc) (ref <130)
Total CHOL/HDL Ratio: 4 (calc) (ref <5.0)
Triglycerides: 132 mg/dL (ref <150)

## 2022-08-28 LAB — CBC WITH DIFFERENTIAL/PLATELET
Absolute Monocytes: 698 cells/uL (ref 200–950)
Basophils Absolute: 112 cells/uL (ref 0–200)
Basophils Relative: 1.2 %
Eosinophils Absolute: 651 cells/uL — ABNORMAL HIGH (ref 15–500)
Eosinophils Relative: 7 %
HCT: 45.3 % — ABNORMAL HIGH (ref 35.0–45.0)
Hemoglobin: 15.1 g/dL (ref 11.7–15.5)
Lymphs Abs: 2995 cells/uL (ref 850–3900)
MCH: 29.9 pg (ref 27.0–33.0)
MCHC: 33.3 g/dL (ref 32.0–36.0)
MCV: 89.7 fL (ref 80.0–100.0)
MPV: 9.6 fL (ref 7.5–12.5)
Monocytes Relative: 7.5 %
Neutro Abs: 4845 cells/uL (ref 1500–7800)
Neutrophils Relative %: 52.1 %
Platelets: 333 10*3/uL (ref 140–400)
RBC: 5.05 10*6/uL (ref 3.80–5.10)
RDW: 12.8 % (ref 11.0–15.0)
Total Lymphocyte: 32.2 %
WBC: 9.3 10*3/uL (ref 3.8–10.8)

## 2022-08-28 LAB — HEMOGLOBIN A1C
Hgb A1c MFr Bld: 5.7 % of total Hgb — ABNORMAL HIGH (ref <5.7)
Mean Plasma Glucose: 117 mg/dL
eAG (mmol/L): 6.5 mmol/L

## 2022-09-01 ENCOUNTER — Encounter: Payer: Self-pay | Admitting: Gastroenterology

## 2022-09-17 ENCOUNTER — Ambulatory Visit (INDEPENDENT_AMBULATORY_CARE_PROVIDER_SITE_OTHER): Admitting: Internal Medicine

## 2022-09-17 ENCOUNTER — Encounter: Payer: Self-pay | Admitting: Internal Medicine

## 2022-09-17 VITALS — BP 108/80 | HR 90 | Temp 98.7°F | Ht 66.5 in | Wt 200.0 lb

## 2022-09-17 DIAGNOSIS — J4531 Mild persistent asthma with (acute) exacerbation: Secondary | ICD-10-CM

## 2022-09-17 DIAGNOSIS — Z23 Encounter for immunization: Secondary | ICD-10-CM

## 2022-09-17 DIAGNOSIS — J4540 Moderate persistent asthma, uncomplicated: Secondary | ICD-10-CM

## 2022-09-17 MED ORDER — BREZTRI AEROSPHERE 160-9-4.8 MCG/ACT IN AERO: 2.0000 | INHALATION_SPRAY | Freq: Two times a day (BID) | RESPIRATORY_TRACT | 3 refills | Status: DC

## 2022-09-17 NOTE — Patient Instructions (Signed)
Please schedule follow up scheduled with APP in 3 months.  During your next visit I would like you to have: Spirometry/Feno to better evaluate your lung function.   Since you feel your asthma is well controlled right now we will continue breztri 2 puffs twice a day with albuterol as needed.  Continue singulair for allergies and asthma. We can always do allergy testing in the future if necessary.   We will give you RSV vaccine and flu vaccine today.

## 2022-09-17 NOTE — Addendum Note (Signed)
Addended by: Vanessa Barbara on: 09/17/2022 04:46 PM   Modules accepted: Orders

## 2022-09-17 NOTE — Progress Notes (Deleted)
Sonya Peterson    [id number]    1956/09/09  Primary Care Physician:McKeown, Gwyndolyn Saxon, MD  Referring Physician: Alycia Rossetti, NP [place] [address] [place],  [place] [address] Reason for Consultation: asthma Date of Consultation: 09/17/2022  Chief complaint:   Chief Complaint  Patient presents with   Consult    Has always had allergies.  Has had asthma about 10 years.  She say saw pcp a couple of weeks ago for a cough with some mucous.  She was put on an antibiotic and steroid and recovered from that.  Her sats dropped into the 80's during her colonoscopy.     HPI: Sonya Peterson is a 66 y.o. woman with history of asthma and allergies. Has had asthma for about ten years. Has been on advair in the past and did ok on that.  Never had PFTs.  Had recent asthma exacerbation treated with steroids and abx by PCP.   Had a colonoscopy and after waking up for anesthesia she had saturations of 89% and apparently need more oxygen.   Current Regimen: breztri 2 puffs twice day, albuterol prn (never used.) Asthma Triggers: environmental allergies, seasonal changes Exacerbations in the last year: yes, treated with steroids last month by PCP.  History of hospitalization or intubation: never Allergy Testing: never had.  GERD: denies Allergic Rhinitis: yes on singulair. Allergic to cats.  ACT:  Asthma Control Test ACT Total Score  09/17/2022  2:14 PM 23   FeNO:  Denies snoring. Denies excessive daytime sleepiness.    Social history:  Occupation: retired, worked as a Programme researcher, broadcasting/film/video.  Exposures: lives at home with husband. Has pet dogs. Smoking history: social smoker. Passive smoke exposure in childhood and occupation.   Social History   Occupational History   Not on file  Tobacco Use   Smoking status: Never    Passive exposure: Past   Smokeless tobacco: Never  Vaping Use   Vaping Use: Never used  Substance and Sexual Activity   Alcohol use: No    Comment:  ocassionaly   Drug use: No   Sexual activity: Not on file    Relevant family history:  Family History  Problem Relation Age of Onset   Cancer Mother 19       breast   Hypertension Mother    Diabetes Mother    Heart disease Mother    Cancer Father 20       prostate   Heart disease Maternal Grandfather        heart failure   Colon cancer Paternal Grandmother    Crohn's disease Daughter     Past Medical History:  Diagnosis Date   Allergy    Anxiety    Arthritis    Asthma    Bronchitis    Depression    OCD   History of hepatitis B    History of kidney stones    Hyperlipidemia    Hypertension    Obesity (BMI 30.0-34.9)    Prediabetes 11/09/2013   Vitamin D deficiency     Past Surgical History:  Procedure Laterality Date   ACHILLES TENDON SURGERY     BACK SURGERY     BREAST SURGERY Left    lump removed left   DILATION AND CURETTAGE OF UTERUS     EXTRACORPOREAL SHOCK WAVE LITHOTRIPSY Left 10/23/2019   Procedure: EXTRACORPOREAL SHOCK WAVE LITHOTRIPSY (ESWL);  Surgeon: Festus Aloe, MD;  Location: [facility];  Service: Urology;  Laterality: Left;   HEEL SPUR SURGERY Left 2015   I & D EXTREMITY Right 08/06/2017   Procedure: IRRIGATION AND DEBRIDEMENT RIGHT ACHILLES;  Surgeon: Newt Minion, MD;  Location: [facility];  Service: Orthopedics;  Laterality: Right;   LUMBAR DISC SURGERY  2004   TUBAL LIGATION       Physical Exam: Blood pressure 108/80, pulse 90, temperature 98.7 F (37.1 C), temperature source Oral, height 5' 6.5" (1.689 m), weight 200 lb (90.7 kg), SpO2 96 %. Gen:      No acute distress ENT:  ***no nasal polyps, mucus membranes moist Lungs:    No increased respiratory effort, symmetric chest wall excursion, clear to auscultation bilaterally, ***no wheezes or crackles CV:         Regular rate and rhythm; no murmurs, rubs, or gallops.  No pedal edema Abd:      + bowel sounds; soft, non-tender; no distension MSK: no acute synovitis of DIP or PIP joints, no  mechanics hands.  Skin:      Warm and dry; no rashes*** Neuro: normal speech, no focal facial asymmetry Psych: alert and oriented x3, normal mood and affect   Data Reviewed/Medical Decision Making:  Independent interpretation of tests: Imaging:  Review of patient's *** images revealed ***. The patient's images have been independently reviewed by me.    PFTs: I have personally reviewed the patient's PFTs and ***     No data to display          Labs: ***  Immunization status:  Immunization History  Administered Date(s) Administered   Influenza Inj Mdck Quad With Preservative 08/16/2017, 09/20/2018, 09/13/2019, 09/18/2020   Influenza Split 08/31/2013, 10/04/2014, 10/09/2015   Influenza, Seasonal, Injecte, Preservative Fre 09/21/2016   Influenza,inj,Quad PF,6+ Mos 09/16/2021   PFIZER(Purple Top)SARS-COV-2 Vaccination 02/17/2020, 03/12/2020, 10/28/2020   Tdap 03/28/2015     I reviewed prior external note(s) from PCP  I reviewed the result(s) of the labs and imaging as noted above.   I have ordered spiro and feno  Discussion of management or test interpretation with another colleague ***.   Assessment:  Moderate persistent asthma, not well controlled  Plan/Recommendations:   We discussed disease management and progression at length today.   I spent *** minutes in the care of this patient today including pre-charting, chart review, review of results, face-to-face care, coordination of care and communication with consultants etc.).  [id number]  15-29 minutes or Straightforward MDM  [id number] 30-44 minutes or Low level MDM  [id number] 45-59 minutes or Moderate level MDM  [id number] 60-74 minutes or High level MDM   Return to Care: No follow-ups on file.  Lenice Llamas, MD [facility] [place]  CC: Alycia Rossetti, NP

## 2022-09-17 NOTE — Progress Notes (Signed)
Sonya Peterson    [id number]    01-Dec-1956  Primary Care Physician:McKeown, Gwyndolyn Saxon, MD  Referring Physician: Alycia Rossetti, NP [place] [place],  [place] [address] Reason for Consultation: asthma Date of Consultation: 09/17/2022  Chief complaint:   Chief Complaint  Patient presents with   Consult    Has always had allergies.  Has had asthma about 10 years.  She say saw pcp a couple of weeks ago for a cough with some mucous.  She was put on an antibiotic and steroid and recovered from that.  Her sats dropped into the 80's during her colonoscopy.     HPI: Sonya Peterson is a 66 y.o. woman who presents for new patient evaluation for shortness of breath and asthma.   Has had asthma for about ten years. Has been on advair in the past and did ok on that.  Never had PFTs.  Had recent asthma exacerbation treated with steroids and abx by PCP.    Had a colonoscopy and after waking up for anesthesia she had saturations of 89% and apparently need more oxygen briefly while recovering.   Current Regimen: breztri 2 puffs twice day, albuterol prn (never used.) Asthma Triggers: environmental allergies, seasonal changes Exacerbations in the last year: yes, treated with steroids last month by PCP.  History of hospitalization or intubation: never Allergy Testing: never had.  GERD: denies Allergic Rhinitis: yes on singulair. Allergic to cats.  ACT:  Asthma Control Test ACT Total Score  09/17/2022  2:14 PM 23   FeNO:  Denies snoring. Denies excessive daytime sleepiness.    Social history:    Occupation: retired, worked as a Programme researcher, broadcasting/film/video.  Exposures: lives at home with husband. Has pet dogs. Smoking history: social smoker. Passive smoke exposure in childhood and occupation.     Social History   Occupational History   Not on file  Tobacco Use   Smoking status: Never    Passive exposure: Past   Smokeless tobacco: Never  Vaping Use   Vaping Use: Never  used  Substance and Sexual Activity   Alcohol use: No    Comment: ocassionaly   Drug use: No   Sexual activity: Not on file    Relevant family history:  Family History  Problem Relation Age of Onset   Cancer Mother 89       breast   Hypertension Mother    Diabetes Mother    Heart disease Mother    Cancer Father 7       prostate   Heart disease Maternal Grandfather        heart failure   Colon cancer Paternal Grandmother    Crohn's disease Daughter     Past Medical History:  Diagnosis Date   Allergy    Anxiety    Arthritis    Asthma    Bronchitis    Depression    OCD   History of hepatitis B    History of kidney stones    Hyperlipidemia    Hypertension    Obesity (BMI 30.0-34.9)    Prediabetes 11/09/2013   Vitamin D deficiency     Past Surgical History:  Procedure Laterality Date   ACHILLES TENDON SURGERY     BACK SURGERY     BREAST SURGERY Left    lump removed left   DILATION AND CURETTAGE OF UTERUS     EXTRACORPOREAL SHOCK WAVE LITHOTRIPSY Left 10/23/2019   Procedure: EXTRACORPOREAL SHOCK  WAVE LITHOTRIPSY (ESWL);  Surgeon: Festus Aloe, MD;  Location: [facility];  Service: Urology;  Laterality: Left;   HEEL SPUR SURGERY Left 2015   I & D EXTREMITY Right 08/06/2017   Procedure: IRRIGATION AND DEBRIDEMENT RIGHT ACHILLES;  Surgeon: Newt Minion, MD;  Location: [facility];  Service: Orthopedics;  Laterality: Right;   LUMBAR DISC SURGERY  2004   TUBAL LIGATION       Physical Exam: Blood pressure 108/80, pulse 90, temperature 98.7 F (37.1 C), temperature source Oral, height 5' 6.5" (1.689 m), weight 200 lb (90.7 kg), SpO2 96 %. Gen:      No acute distress ENT:  no nasal polyps, mucus membranes moist Lungs:    No increased respiratory effort, symmetric chest wall excursion, clear to auscultation bilaterally, no wheezes or crackles CV:         Regular rate and rhythm; no murmurs, rubs, or gallops.  No pedal edema Abd:      + bowel sounds; soft, non-tender;  no distension MSK: no acute synovitis of DIP or PIP joints, no mechanics hands.  Skin:      Warm and dry; no rashes Neuro: normal speech, no focal facial asymmetry Psych: alert and oriented x3, normal mood and affect   Data Reviewed/Medical Decision Making:  Independent interpretation of tests: Imaging:  Review of patient's chest xray July 2020 images revealed no acute process. The patient's images have been independently reviewed by me.    PFTs:  Labs:  Lab Results  Component Value Date   WBC 9.3 08/27/2022   HGB 15.1 08/27/2022   HCT 45.3 (H) 08/27/2022   MCV 89.7 08/27/2022   PLT 333 08/27/2022   Lab Results  Component Value Date   NA 138 08/27/2022   K 4.4 08/27/2022   CL 101 08/27/2022   CO2 27 08/27/2022      Immunization status:  Immunization History  Administered Date(s) Administered   Influenza Inj Mdck Quad With Preservative 08/16/2017, 09/20/2018, 09/13/2019, 09/18/2020   Influenza Split 08/31/2013, 10/04/2014, 10/09/2015   Influenza, Seasonal, Injecte, Preservative Fre 09/21/2016   Influenza,inj,Quad PF,6+ Mos 09/16/2021   PFIZER(Purple Top)SARS-COV-2 Vaccination 02/17/2020, 03/12/2020, 10/28/2020   Tdap 03/28/2015     I reviewed prior external note(s) from PCP  I reviewed the result(s) of the labs and imaging as noted above.   I have ordered spiro and feno   Assessment:  Moderate persistent asthma with recent exacerbation Allergic rhinitis   Plan/Recommendations: Since you feel your asthma is well controlled right now we will continue breztri 2 puffs twice a day with albuterol as needed.  Continue singulair for allergies and asthma. We can always do allergy testing in the future if necessary.   We will give you RSV vaccine and flu vaccine today.  Spirometry/Feno to better evaluate your lung function at next visit. IF not well controlled consider changing to higher dose ICS with ICS-LABA.   We discussed disease management and progression at  length today.    Return to Care: Return in about 3 months (around 12/18/2022). With APP I will see her back after that in spring.   Lenice Llamas, MD [facility]  CC: Alycia Rossetti, NP

## 2022-12-18 ENCOUNTER — Encounter: Payer: Self-pay | Admitting: Primary Care

## 2022-12-18 ENCOUNTER — Ambulatory Visit (INDEPENDENT_AMBULATORY_CARE_PROVIDER_SITE_OTHER): Admitting: Primary Care

## 2022-12-18 VITALS — BP 134/94 | HR 77 | Ht 66.5 in | Wt 200.2 lb

## 2022-12-18 DIAGNOSIS — J4540 Moderate persistent asthma, uncomplicated: Secondary | ICD-10-CM | POA: Diagnosis not present

## 2022-12-18 DIAGNOSIS — J45909 Unspecified asthma, uncomplicated: Secondary | ICD-10-CM | POA: Insufficient documentation

## 2022-12-18 NOTE — Assessment & Plan Note (Signed)
-  Stable; No recent exacerbations or prednisone use. Currently dealing with some chest congestion. No significant cough or mucus production. She is compliant with [employer]. SABA < 1/week. ACT score 22. Advised she take mucinex-dm twice daily for 1-2 weeks. Recommend she remove birds from her bedroom. We will get baseline PFTs prior to next visit. If diffusion capacity is decreased would get chest imaging. FU in 6 months or sooner if needed.

## 2022-12-18 NOTE — Patient Instructions (Signed)
Recommendations: - Continue Breztri Aerosphere - take two puffs morning and evening - Use Albuterol 2 puffs every 4-6 hours every 4-6 hours for shortness of breath and wheezing  - Continue Singulair '10mg'$  at bedtime - Continue Zyrtec '10mg'$  daily - Start Mucinex - DM '600mg'$  twice daily x 1-2 weeks  - If cough not better or noticed purulence notify office   Orders: - PFTs in 6 months   Follow-up: - 6 months with Dr. Shearon Stalls (1 hour PFT prior)

## 2022-12-18 NOTE — Progress Notes (Signed)
$'@Patient'U$  ID: Sonya Peterson, female    DOB: [birth year], 67 y.o.   MRN: [id number]  No chief complaint on file.   Referring provider: Unk Pinto, MD  HPI: 67 year old female, never smoked  PMH HTN, asthma, hyperlipidemia, vit D deficiency, depression, obesity Patient of Dr. Shearon Stalls, last seen in October 2023   Previous [facility] pulmonary encounter: 09/17/22 YIDES SAIDI is a 67 y.o. woman who presents for new patient evaluation for shortness of breath and asthma.   Has had asthma for about ten years. Has been on advair in the past and did ok on that.  Never had PFTs.  Had recent asthma exacerbation treated with steroids and abx by PCP.    Had a colonoscopy and after waking up for anesthesia she had saturations of 89% and apparently need more oxygen briefly while recovering.   Current Regimen: breztri 2 puffs twice day, albuterol prn (never used.) Asthma Triggers: environmental allergies, seasonal changes Exacerbations in the last year: yes, treated with steroids last month by PCP.  History of hospitalization or intubation: never Allergy Testing: never had.  GERD: denies Allergic Rhinitis: yes on singulair. Allergic to cats.  ACT: 23   12/18/2022 Patient presents today for 3 month follow-up asthma. She is currently dealing with some chest congestion. She does not feel acutely ill. She has an occasional cough in the morning but otherwise does not cough. She has dogs and birds at home. She is compliant with Breztri. Uses albuterol < 1/week. Not currently taking anything over the counter for chest congestion/cough. She declined CXR today. No PFTs on file. Breathing is ok. Denies chest tightness, wheezing, or shortness of breathing.   Current Regimen: Breztri 2 puffs twice day SABA: Albuterol HFA <1/week Asthma Triggers: Environmental allergies, seasonal changes Exacerbations in the last year: None  History of hospitalization or intubation: Never Allergy Testing: never had   GERD: denies Allergic Rhinitis: yes on singulair. Allergic to cats.  ACT: 22  No Known Allergies  Immunization History  Administered Date(s) Administered   Influenza Inj Mdck Quad With Preservative 08/16/2017, 09/20/2018, 09/13/2019, 09/18/2020   Influenza Split 08/31/2013, 10/04/2014, 10/09/2015   Influenza, Seasonal, Injecte, Preservative Fre 09/21/2016   Influenza,inj,Quad PF,6+ Mos 09/16/2021, 09/17/2022   PFIZER(Purple Top)SARS-COV-2 Vaccination 02/17/2020, 03/12/2020, 10/28/2020   RSV IGIV 09/17/2022   Tdap 03/28/2015    Past Medical History:  Diagnosis Date   Allergy    Anxiety    Arthritis    Asthma    Bronchitis    Depression    OCD   History of hepatitis B    History of kidney stones    Hyperlipidemia    Hypertension    Obesity (BMI 30.0-34.9)    Prediabetes 11/09/2013   Vitamin D deficiency     Tobacco History: Social History   Tobacco Use  Smoking Status Never   Passive exposure: Past  Smokeless Tobacco Never   Counseling given: Not Answered   Outpatient Medications Prior to Visit  Medication Sig Dispense Refill   albuterol (VENTOLIN HFA) 108 (90 Base) MCG/ACT inhaler Inhale 2 puffs into the lungs every 6 (six) hours as needed for wheezing or shortness of breath. 8 g 2   Budeson-Glycopyrrol-Formoterol (BREZTRI AEROSPHERE) 160-9-4.8 MCG/ACT AERO Inhale 2 puffs into the lungs in the morning and at bedtime. 3 each 3   cetirizine (ZYRTEC ALLERGY) 10 MG tablet Take 1 tablet (10 mg total) by mouth daily. 90 tablet 3   Cholecalciferol (VITAMIN D3) 5000 units TABS Take 5,000  Units by mouth at bedtime.     FLUoxetine (PROZAC) 20 MG capsule TAKE 3 CAPSULES DAILY FOR MOOD (Patient taking differently: Take 60 mg by mouth daily. Take 3 capsules Daily for Mood) 270 capsule 3   montelukast (SINGULAIR) 10 MG tablet TAKE 1 TABLET DAILY FOR ALLERGIES 90 tablet 3   simvastatin (ZOCOR) 40 MG tablet TAKE 1 TABLET AT BEDTIME FOR CHOLESTEROL 90 tablet 3   telmisartan  (MICARDIS) 80 MG tablet TAKE 1 TABLET DAILY FOR BLOOD PRESSURE (Patient taking differently: Take 40 mg by mouth daily. TAKE 0.5 TABLET DAILY FOR BLOOD PRESSURE) 90 tablet 3   Facility-Administered Medications Prior to Visit  Medication Dose Route Frequency Provider Last Rate Last Admin   0.9 %  sodium chloride infusion  500 mL Intravenous Once Cirigliano, Vito V, DO       Review of Systems  Review of Systems  Constitutional: Negative.   HENT: Negative.    Respiratory:  Positive for cough. Negative for chest tightness, shortness of breath and wheezing.   Cardiovascular: Negative.     Physical Exam  BP (!) 134/94 (BP Location: Right Arm, Cuff Size: Normal)   Pulse 77   Ht 5' 6.5" (1.689 m)   Wt 200 lb 3.2 oz (90.8 kg)   SpO2 93%   BMI 31.83 kg/m  Physical Exam Constitutional:      Appearance: Normal appearance.  HENT:     Head: Normocephalic and atraumatic.     Mouth/Throat:     Mouth: Mucous membranes are moist.     Pharynx: Oropharynx is clear.  Cardiovascular:     Rate and Rhythm: Normal rate and regular rhythm.  Pulmonary:     Effort: Pulmonary effort is normal.     Breath sounds: Normal breath sounds.  Musculoskeletal:        General: Normal range of motion.  Skin:    General: Skin is warm and dry.  Neurological:     General: No focal deficit present.     Mental Status: She is alert and oriented to person, place, and time. Mental status is at baseline.  Psychiatric:        Mood and Affect: Mood normal.        Behavior: Behavior normal.        Thought Content: Thought content normal.        Judgment: Judgment normal.      Lab Results:  CBC    Component Value Date/Time   WBC 9.3 08/27/2022 1154   RBC 5.05 08/27/2022 1154   HGB 15.1 08/27/2022 1154   HCT 45.3 (H) 08/27/2022 1154   PLT 333 08/27/2022 1154   MCV 89.7 08/27/2022 1154   MCH 29.9 08/27/2022 1154   MCHC 33.3 08/27/2022 1154   RDW 12.8 08/27/2022 1154   LYMPHSABS 2,995 08/27/2022 1154    MONOABS 496 03/16/2017 1634   EOSABS 651 (H) 08/27/2022 1154   BASOSABS 112 08/27/2022 1154    BMET    Component Value Date/Time   NA 138 08/27/2022 1154   K 4.4 08/27/2022 1154   CL 101 08/27/2022 1154   CO2 27 08/27/2022 1154   GLUCOSE 84 08/27/2022 1154   BUN 16 08/27/2022 1154   CREATININE 0.70 08/27/2022 1154   CALCIUM 9.3 08/27/2022 1154   GFRNONAA 97 02/17/2021 1519   GFRAA 112 02/17/2021 1519    BNP No results found for: "BNP"  ProBNP No results found for: "PROBNP"  Imaging: No results found.   Assessment & Plan:  Asthma - Stable; No recent exacerbations or prednisone use. Currently dealing with some chest congestion. No significant cough or mucus production. She is compliant with [employer]. SABA < 1/week. ACT score 22. Advised she take mucinex-dm twice daily for 1-2 weeks. Recommend she remove birds from her bedroom. We will get baseline PFTs prior to next visit. If diffusion capacity is decreased would get chest imaging. FU in 6 months or sooner if needed.    Martyn Ehrich, NP 12/18/2022

## 2023-02-17 NOTE — Progress Notes (Unsigned)
COMPLETE PHYSICAL  Assessment and Plan:   Sonya Peterson was seen today for follow-up.  Diagnoses and all orders for this visit:  Encounter for routine medical examination with abnormal findings Yearly  Essential hypertension Continue current medications: Telmisartan '80mg'$  daily Monitor blood pressure at home; call if consistently over 130/80 Continue DASH diet.   Reminder to go to the ER if any CP, SOB, nausea, dizziness, severe HA, changes vision/speech, left arm numbness and tingling and jaw pain. -     CBC with Differential/Platelet -     COMPLETE METABOLIC PANEL WITH GFR  Hyperlipidemia, unspecified hyperlipidemia type Continue medications:simvastatin '40mg'$  nightly Discussed dietary and exercise modifications Low fat diet -     Lipid panel  Abnormal glucose Continue diet and exercise - A1c  Obesity (BMI 30.0-34.9) BMI 34.1 Long discussion about weight loss, diet, and exercise Recommended diet heavy in fruits and veggies and low in animal meats, cheeses, and dairy products, appropriate calorie intake Patient will work on decreasing saturated fats, simple carbs and increase lean proteins and exercise Follow up at next visit   Mild intermittent asthma in adult without complication Well controlled Encouraged daily Advair use Has in date rescue inhaler Continue singular daily  Recurrent major depressive disorder, in partial remission ([place]) Continue medications: fluoxetine '20mg'$  daily Doing well at this time Discussed stress management techniques  Discussed, increase water,intake & good sleep hygiene  Discussed increasing exercise & vegetables in diet   Vitamin D Def Continue supplementation to maintain goal of 70-100 Taking Vitamin D 5,000 IU daily -vitamin D level  Allergy, subsequent encounter Currently using loratadine, advised  to change to generic Zyrtec or Allegra Continue Singulair and Advair daily with Albuterol as needed Mucinex as needed for  congestion   Medication Management Magnesium  Screening, ischemic heart disease -     EKG 12-Lead  Screening for thyroid disorder       - TSH   Screening for blood or protein in urine -     Urinalysis routine with reflex microscopic -     Microalbumin/creatinine urine ratio  Screening for colon cancer Referral made to Dr. Earlean Shawl office for colonoscopy Previous Colonoscopy was 2011    Continue diet and meds as discussed. Further disposition pending results of labs.   Further disposition pending results if labs check today. Discussed med's effects and SE's.   Over 30 minutes of face to face interview, exam, counseling, chart review, and critical decision making was performed.   Future Appointments  Date Time Provider [place]  02/18/2023  2:00 PM Alycia Rossetti, NP [facility] None  02/21/2024  2:00 PM Alycia Rossetti, NP [facility] None    HPI 67 y.o. female  presents for 3 month follow up with HTN, HLD, intermittent asthma, depression, prediabetes, weight management and vitamin D deficiency  She reports overall she is doing well.   She has history of allergies, asthma, on allergy pill. Has congestion morning and at night.  She has noticed her allergies have been more severe over the past . She has itching eye and runny nose of clear mucus.  Has also noted some sneezing. She is currently on Claritin ,Singulair, Advair daily and Albuterol as needed. CXR 06/2019   Her mom passed 04-22-20, died at 67. Daughter, Sonya Peterson works at [employer]  Her blood pressure has not been checked at home, today their BP is    BP Readings from Last 5 Encounters:  12/18/22 (!) 134/94  09/17/22 108/80  08/27/22 134/86  08/25/22 (!) 155/84  07/16/22 130/76   She does not workout.  She denies chest pain, shortness of breath, dizziness.  She is on cholesterol medication.  simvastatin '40mg'$  nightly  and denies myalgias. Her cholesterol is at goal. The cholesterol last  visit was:   Lab Results  Component Value Date   CHOL 153 08/27/2022   HDL 38 (L) 08/27/2022   LDLCALC 92 08/27/2022   TRIG 132 08/27/2022   CHOLHDL 4.0 08/27/2022   She has been working on diet and exercise for prediabetes, and denies paresthesia of the feet, polydipsia, polyuria and visual disturbances. Last A1C in the office was:  Lab Results  Component Value Date   HGBA1C 5.7 (H) 08/27/2022   Patient is on Vitamin D supplement.   Lab Results  Component Value Date   VD25OH 62 02/17/2022     BMI is There is no height or weight on file to calculate BMI., she is walking for exercise. She is trying to eat more fresh fruits and vegetable.  Limiting red meat  Has increased water intake. Has been continuing to work on this.  Wt Readings from Last 3 Encounters:  12/18/22 200 lb 3.2 oz (90.8 kg)  09/17/22 200 lb (90.7 kg)  08/27/22 192 lb 6.4 oz (87.3 kg)    Current Medications:     Current Outpatient Medications (Cardiovascular):    simvastatin (ZOCOR) 40 MG tablet, TAKE 1 TABLET AT BEDTIME FOR CHOLESTEROL   telmisartan (MICARDIS) 80 MG tablet, TAKE 1 TABLET DAILY FOR BLOOD PRESSURE (Patient taking differently: Take 40 mg by mouth daily. TAKE 0.5 TABLET DAILY FOR BLOOD PRESSURE)   Current Outpatient Medications (Respiratory):    albuterol (VENTOLIN HFA) 108 (90 Base) MCG/ACT inhaler, Inhale 2 puffs into the lungs every 6 (six) hours as needed for wheezing or shortness of breath.   Budeson-Glycopyrrol-Formoterol (BREZTRI AEROSPHERE) 160-9-4.8 MCG/ACT AERO, Inhale 2 puffs into the lungs in the morning and at bedtime.   cetirizine (ZYRTEC ALLERGY) 10 MG tablet, Take 1 tablet (10 mg total) by mouth daily.   montelukast (SINGULAIR) 10 MG tablet, TAKE 1 TABLET DAILY FOR ALLERGIES       Current Outpatient Medications (Other):    Cholecalciferol (VITAMIN D3) 5000 units TABS, Take 5,000 Units by mouth at bedtime.   FLUoxetine (PROZAC) 20 MG capsule, TAKE 3 CAPSULES DAILY FOR MOOD  (Patient taking differently: Take 60 mg by mouth daily. Take 3 capsules Daily for Mood)  Current Facility-Administered Medications (Other):    0.9 %  sodium chloride infusion  Medical History:  Past Medical History:  Diagnosis Date   Allergy    Anxiety    Arthritis    Asthma    Bronchitis    Depression    OCD   History of hepatitis B    History of kidney stones    Hyperlipidemia    Hypertension    Obesity (BMI 30.0-34.9)    Prediabetes 11/09/2013   Vitamin D deficiency    Allergies: No Known Allergies   Review of Systems  Constitutional:  Negative for chills and fever.  HENT:  Positive for congestion. Negative for hearing loss, sinus pain, sore throat and tinnitus.   Eyes:  Negative for blurred vision and double vision.  Respiratory:  Positive for wheezing (intermittent, resolves with Albuterol). Negative for cough, hemoptysis, sputum production and shortness of breath.   Cardiovascular:  Negative for chest pain, palpitations and leg swelling.  Gastrointestinal:  Negative for abdominal pain, constipation, diarrhea, heartburn, nausea and vomiting.  Genitourinary:  Negative for dysuria and urgency.  Musculoskeletal:  Negative for back pain, falls, joint pain, myalgias and neck pain.  Skin:  Negative for rash.  Neurological:  Negative for dizziness, tingling, tremors, weakness and headaches.  Endo/Heme/Allergies:  Does not bruise/bleed easily.  Psychiatric/Behavioral:  Negative for depression and suicidal ideas. The patient is not nervous/anxious and does not have insomnia.     Names of Other Physician/Practitioners you currently use: 1. [facility] here for primary care 2. Eye doctor,  [phone] 3. Dental Exam: Due for 2022 Patient Care Team: Unk Pinto, MD as PCP - General (Internal Medicine) Newt Minion, MD as Consulting Physician (Orthopedic Surgery) Richmond Campbell, MD as Consulting Physician (Gastroenterology) Festus Aloe, MD as Consulting Physician (Urology)    Screening Tests: Immunization History  Administered Date(s) Administered   Influenza Inj Mdck Quad With Preservative 08/16/2017, 09/20/2018, 09/13/2019, 09/18/2020   Influenza Split 08/31/2013, 10/04/2014, 10/09/2015   Influenza, Seasonal, Injecte, Preservative Fre 09/21/2016   Influenza,inj,Quad PF,6+ Mos 09/16/2021, 09/17/2022   PFIZER(Purple Top)SARS-COV-2 Vaccination 02/17/2020, 03/12/2020, 10/28/2020   RSV IGIV 09/17/2022   Tdap 03/28/2015    Preventative care: Last colonoscopy: previously had with Dr. Earlean Shawl Last mammogram: 01/27/22 Last pap smear/pelvic exam: Pap 01/2018, due 2024 DEXA: Due at age 67   Vaccinations: TD or Tdap: 2016  Influenza:09/2020 Pneumococcal: Due at age 50 Shingles/Zostavax: Shingrix: 01/2021, second scheduled, [facility]   Family history- Review and unchanged Social history- Review and unchanged  Physical Exam: There were no vitals taken for this visit. Wt Readings from Last 3 Encounters:  12/18/22 200 lb 3.2 oz (90.8 kg)  09/17/22 200 lb (90.7 kg)  08/27/22 192 lb 6.4 oz (87.3 kg)   General Appearance: Well nourished, in no apparent distress. Eyes: PERRLA, EOMs, conjunctiva no swelling or erythema Sinuses: No Frontal/maxillary tenderness ENT/Mouth: Ext aud canals clear, TMs without erythema, bulging. Right TM fluid noted No erythema, swelling, or exudate on post pharynx.  Tonsils not swollen or erythematous. Hearing normal. Pale nasal mucosa Neck: Supple, thyroid normal.  Respiratory: Respiratory effort normal, BS equal bilaterally without rales, rhonchi, wheezing or stridor.  Cardio: RRR with no MRGs. Brisk peripheral pulses without edema.  Abdomen: Soft, + BS.  Non tender, no guarding, rebound, hernias, masses. Lymphatics: Non tender without lymphadenopathy.  Musculoskeletal: Full ROM, 5/5 strength, Normal gait Skin: Warm, dry without rashes, lesions, ecchymosis.  Neuro: Cranial nerves  intact. No cerebellar symptoms.  Psych: Awake and oriented X 3, normal affect, Insight and Judgment appropriate.   EKG: IRBBB, no ST changes   [facility]  02/17/2023

## 2023-02-18 ENCOUNTER — Ambulatory Visit (INDEPENDENT_AMBULATORY_CARE_PROVIDER_SITE_OTHER): Admitting: Nurse Practitioner

## 2023-02-18 ENCOUNTER — Encounter: Payer: Self-pay | Admitting: Nurse Practitioner

## 2023-02-18 VITALS — BP 132/82 | HR 69 | Temp 97.9°F | Ht 64.5 in | Wt 197.8 lb

## 2023-02-18 DIAGNOSIS — E785 Hyperlipidemia, unspecified: Secondary | ICD-10-CM

## 2023-02-18 DIAGNOSIS — F3341 Major depressive disorder, recurrent, in partial remission: Secondary | ICD-10-CM

## 2023-02-18 DIAGNOSIS — Z131 Encounter for screening for diabetes mellitus: Secondary | ICD-10-CM | POA: Diagnosis not present

## 2023-02-18 DIAGNOSIS — E559 Vitamin D deficiency, unspecified: Secondary | ICD-10-CM

## 2023-02-18 DIAGNOSIS — Z0001 Encounter for general adult medical examination with abnormal findings: Secondary | ICD-10-CM

## 2023-02-18 DIAGNOSIS — I700 Atherosclerosis of aorta: Secondary | ICD-10-CM | POA: Diagnosis not present

## 2023-02-18 DIAGNOSIS — T7840XD Allergy, unspecified, subsequent encounter: Secondary | ICD-10-CM

## 2023-02-18 DIAGNOSIS — Z13220 Encounter for screening for lipoid disorders: Secondary | ICD-10-CM

## 2023-02-18 DIAGNOSIS — Z0000 Encounter for general adult medical examination without abnormal findings: Secondary | ICD-10-CM

## 2023-02-18 DIAGNOSIS — E669 Obesity, unspecified: Secondary | ICD-10-CM

## 2023-02-18 DIAGNOSIS — Z136 Encounter for screening for cardiovascular disorders: Secondary | ICD-10-CM | POA: Diagnosis not present

## 2023-02-18 DIAGNOSIS — J4531 Mild persistent asthma with (acute) exacerbation: Secondary | ICD-10-CM

## 2023-02-18 DIAGNOSIS — Z79899 Other long term (current) drug therapy: Secondary | ICD-10-CM

## 2023-02-18 DIAGNOSIS — I10 Essential (primary) hypertension: Secondary | ICD-10-CM | POA: Diagnosis not present

## 2023-02-18 DIAGNOSIS — Z1389 Encounter for screening for other disorder: Secondary | ICD-10-CM

## 2023-02-18 DIAGNOSIS — R7309 Other abnormal glucose: Secondary | ICD-10-CM

## 2023-02-18 DIAGNOSIS — Z1329 Encounter for screening for other suspected endocrine disorder: Secondary | ICD-10-CM

## 2023-02-18 DIAGNOSIS — E66811 Obesity, class 1: Secondary | ICD-10-CM

## 2023-02-18 MED ORDER — LORATADINE 10 MG PO TABS: 10.0000 mg | ORAL_TABLET | Freq: Every day | ORAL | 2 refills | Status: DC

## 2023-02-18 NOTE — Patient Instructions (Addendum)
[employer] life protein shakes Eat more frequently - try not to go more than 6 hours without protein Aim for 90 grams of protein a day- 30 breakfast/30 lunch 30 dinner Try to keep net carbs less than 50 Net Carbs=Total Carbs-fiber- sugar alcohols Exercise heartrate 120-140(fat burning zone)- walking 20-30 minutes 4 days a week   GENERAL HEALTH GOALS   Know what a healthy weight is for you (roughly BMI <25) and aim to maintain this   Aim for 7+ servings of fruits and vegetables daily   70-80+ fluid ounces of water or unsweet tea for healthy kidneys   Limit to max 1 drink of alcohol per day; avoid smoking/tobacco   Limit animal fats in diet for cholesterol and heart health - choose grass fed whenever available   Avoid highly processed foods, and foods high in saturated/trans fats   Aim for low stress - take time to unwind and care for your mental health   Aim for 150 min of moderate intensity exercise weekly for heart health, and weights twice weekly for bone health   Aim for 7-9 hours of sleep daily  

## 2023-02-19 LAB — MICROALBUMIN / CREATININE URINE RATIO
Creatinine, Urine: 148 mg/dL (ref 20–275)
Microalb Creat Ratio: 5 mcg/mg creat (ref <30)
Microalb, Ur: 0.8 mg/dL

## 2023-02-19 LAB — URINALYSIS, ROUTINE W REFLEX MICROSCOPIC
Bacteria, UA: NONE SEEN /HPF
Bilirubin Urine: NEGATIVE
Glucose, UA: NEGATIVE
Hgb urine dipstick: NEGATIVE
Hyaline Cast: NONE SEEN /LPF
Ketones, ur: NEGATIVE
Nitrite: NEGATIVE
Protein, ur: NEGATIVE
RBC / HPF: NONE SEEN /HPF (ref 0–2)
Specific Gravity, Urine: 1.025 (ref 1.001–1.035)
Squamous Epithelial / HPF: NONE SEEN /HPF (ref <=5)
WBC, UA: NONE SEEN /HPF (ref 0–5)
pH: <=5 (ref 5.0–8.0)

## 2023-02-19 LAB — CBC WITH DIFFERENTIAL/PLATELET
Absolute Monocytes: 673 cells/uL (ref 200–950)
Basophils Absolute: 102 cells/uL (ref 0–200)
Basophils Relative: 1 %
Eosinophils Absolute: 755 cells/uL — ABNORMAL HIGH (ref 15–500)
Eosinophils Relative: 7.4 %
HCT: 44.5 % (ref 35.0–45.0)
Hemoglobin: 15 g/dL (ref 11.7–15.5)
Lymphs Abs: 2846 cells/uL (ref 850–3900)
MCH: 29.9 pg (ref 27.0–33.0)
MCHC: 33.7 g/dL (ref 32.0–36.0)
MCV: 88.6 fL (ref 80.0–100.0)
MPV: 9.5 fL (ref 7.5–12.5)
Monocytes Relative: 6.6 %
Neutro Abs: 5824 cells/uL (ref 1500–7800)
Neutrophils Relative %: 57.1 %
Platelets: 341 10*3/uL (ref 140–400)
RBC: 5.02 10*6/uL (ref 3.80–5.10)
RDW: 12.5 % (ref 11.0–15.0)
Total Lymphocyte: 27.9 %
WBC: 10.2 10*3/uL (ref 3.8–10.8)

## 2023-02-19 LAB — LIPID PANEL
Cholesterol: 160 mg/dL (ref <200)
HDL: 38 mg/dL — ABNORMAL LOW (ref >=50)
LDL Cholesterol (Calc): 100 mg/dL (calc) — ABNORMAL HIGH
Non-HDL Cholesterol (Calc): 122 mg/dL (calc) (ref <130)
Total CHOL/HDL Ratio: 4.2 (calc) (ref <5.0)
Triglycerides: 121 mg/dL (ref <150)

## 2023-02-19 LAB — COMPLETE METABOLIC PANEL WITH GFR
AG Ratio: 1.4 (calc) (ref 1.0–2.5)
ALT: 10 U/L (ref 6–29)
AST: 12 U/L (ref 10–35)
Albumin: 4.5 g/dL (ref 3.6–5.1)
Alkaline phosphatase (APISO): 98 U/L (ref 37–153)
BUN: 19 mg/dL (ref 7–25)
CO2: 25 mmol/L (ref 20–32)
Calcium: 9.6 mg/dL (ref 8.6–10.4)
Chloride: 103 mmol/L (ref 98–110)
Creat: 0.74 mg/dL (ref 0.50–1.05)
Globulin: 3.3 g/dL (calc) (ref 1.9–3.7)
Glucose, Bld: 86 mg/dL (ref 65–99)
Potassium: 4.4 mmol/L (ref 3.5–5.3)
Sodium: 139 mmol/L (ref 135–146)
Total Bilirubin: 0.3 mg/dL (ref 0.2–1.2)
Total Protein: 7.8 g/dL (ref 6.1–8.1)
eGFR: 90 mL/min/{1.73_m2} (ref >=60)

## 2023-02-19 LAB — HEMOGLOBIN A1C
Hgb A1c MFr Bld: 5.9 % of total Hgb — ABNORMAL HIGH (ref <5.7)
Mean Plasma Glucose: 123 mg/dL
eAG (mmol/L): 6.8 mmol/L

## 2023-02-19 LAB — TSH: TSH: 1.13 mIU/L (ref 0.40–4.50)

## 2023-02-19 LAB — MICROSCOPIC MESSAGE

## 2023-02-19 LAB — VITAMIN D 25 HYDROXY (VIT D DEFICIENCY, FRACTURES): Vit D, 25-Hydroxy: 70 ng/mL (ref 30–100)

## 2023-02-19 LAB — MAGNESIUM: Magnesium: 2.1 mg/dL (ref 1.5–2.5)

## 2023-03-10 ENCOUNTER — Other Ambulatory Visit: Payer: Self-pay | Admitting: Internal Medicine

## 2023-03-10 DIAGNOSIS — Z1231 Encounter for screening mammogram for malignant neoplasm of breast: Secondary | ICD-10-CM

## 2023-03-15 ENCOUNTER — Ambulatory Visit
Admission: RE | Admit: 2023-03-15 | Discharge: 2023-03-15 | Disposition: A | Discharge status: Home / Self Care | Source: Ambulatory Visit | Attending: Internal Medicine | Admitting: Internal Medicine

## 2023-03-15 DIAGNOSIS — Z1231 Encounter for screening mammogram for malignant neoplasm of breast: Secondary | ICD-10-CM

## 2023-03-17 ENCOUNTER — Other Ambulatory Visit: Payer: Self-pay | Admitting: Internal Medicine

## 2023-03-17 DIAGNOSIS — R928 Other abnormal and inconclusive findings on diagnostic imaging of breast: Secondary | ICD-10-CM

## 2023-03-19 ENCOUNTER — Ambulatory Visit
Admission: RE | Admit: 2023-03-19 | Discharge: 2023-03-19 | Disposition: A | Discharge status: Home / Self Care | Source: Ambulatory Visit | Attending: Internal Medicine | Admitting: Internal Medicine

## 2023-03-19 DIAGNOSIS — R928 Other abnormal and inconclusive findings on diagnostic imaging of breast: Secondary | ICD-10-CM

## 2023-04-26 ENCOUNTER — Other Ambulatory Visit: Payer: Self-pay | Admitting: Nurse Practitioner

## 2023-04-26 ENCOUNTER — Telehealth: Payer: Self-pay | Admitting: Nurse Practitioner

## 2023-04-26 DIAGNOSIS — E785 Hyperlipidemia, unspecified: Secondary | ICD-10-CM

## 2023-04-26 MED ORDER — SIMVASTATIN 40 MG PO TABS
ORAL_TABLET | ORAL | 3 refills | Status: DC
Start: 2023-04-26 — End: 2024-07-17

## 2023-04-26 NOTE — Telephone Encounter (Signed)
Patient is requesting a refill on Simvastatin to [facility].

## 2023-07-05 ENCOUNTER — Other Ambulatory Visit: Payer: Self-pay | Admitting: Nurse Practitioner

## 2023-07-05 DIAGNOSIS — T7840XA Allergy, unspecified, initial encounter: Secondary | ICD-10-CM

## 2023-07-08 ENCOUNTER — Encounter: Payer: Self-pay | Admitting: Nurse Practitioner

## 2023-07-08 ENCOUNTER — Ambulatory Visit (INDEPENDENT_AMBULATORY_CARE_PROVIDER_SITE_OTHER): Admitting: Nurse Practitioner

## 2023-07-08 VITALS — BP 130/88 | HR 75 | Temp 97.7°F | Ht 66.0 in | Wt 194.4 lb

## 2023-07-08 DIAGNOSIS — R102 Pelvic and perineal pain: Secondary | ICD-10-CM

## 2023-07-08 DIAGNOSIS — R350 Frequency of micturition: Secondary | ICD-10-CM | POA: Diagnosis not present

## 2023-07-08 DIAGNOSIS — B3731 Acute candidiasis of vulva and vagina: Secondary | ICD-10-CM

## 2023-07-08 DIAGNOSIS — R300 Dysuria: Secondary | ICD-10-CM

## 2023-07-08 DIAGNOSIS — R3915 Urgency of urination: Secondary | ICD-10-CM

## 2023-07-08 MED ORDER — FLUCONAZOLE 150 MG PO TABS
ORAL_TABLET | ORAL | 0 refills | Status: DC
Start: 2023-07-08 — End: 2023-08-23

## 2023-07-08 MED ORDER — CIPROFLOXACIN HCL 250 MG PO TABS
ORAL_TABLET | ORAL | 0 refills | Status: DC
Start: 2023-07-08 — End: 2023-08-23

## 2023-07-08 NOTE — Patient Instructions (Signed)
Urinary Tract Infection, Adult A urinary tract infection (UTI) is an infection of any part of the urinary tract. The urinary tract includes: The kidneys. The ureters. The bladder. The urethra. These organs make, store, and get rid of pee (urine) in the body. What are the causes? This infection is caused by germs (bacteria) in your genital area. These germs grow and cause swelling (inflammation) of your urinary tract. What increases the risk? The following factors may make you more likely to develop this condition: Using a small, thin tube (catheter) to drain pee. Not being able to control when you pee or poop (incontinence). Being female. If you are female, these things can increase the risk: Using these methods to prevent pregnancy: A medicine that kills sperm (spermicide). A device that blocks sperm (diaphragm). Having low levels of a female hormone (estrogen). Being pregnant. You are more likely to develop this condition if: You have genes that add to your risk. You are sexually active. You take antibiotic medicines. You have trouble peeing because of: A prostate that is bigger than normal, if you are female. A blockage in the part of your body that drains pee from the bladder. A kidney stone. A nerve condition that affects your bladder. Not getting enough to drink. Not peeing often enough. You have other conditions, such as: Diabetes. A weak disease-fighting system (immune system). Sickle cell disease. Gout. Injury of the spine. What are the signs or symptoms? Symptoms of this condition include: Needing to pee right away. Peeing small amounts often. Pain or burning when peeing. Blood in the pee. Pee that smells bad or not like normal. Trouble peeing. Pee that is cloudy. Fluid coming from the vagina, if you are female. Pain in the belly or lower back. Other symptoms include: Vomiting. Not feeling hungry. Feeling mixed up (confused). This may be the first symptom in  older adults. Being tired and grouchy (irritable). A fever. Watery poop (diarrhea). How is this treated? Taking antibiotic medicine. Taking other medicines. Drinking enough water. In some cases, you may need to see a specialist. Follow these instructions at home:  Medicines Take over-the-counter and prescription medicines only as told by your doctor. If you were prescribed an antibiotic medicine, take it as told by your doctor. Do not stop taking it even if you start to feel better. General instructions Make sure you: Pee until your bladder is empty. Do not hold pee for a long time. Empty your bladder after sex. Wipe from front to back after peeing or pooping if you are a female. Use each tissue one time when you wipe. Drink enough fluid to keep your pee pale yellow. Keep all follow-up visits. Contact a doctor if: You do not get better after 1-2 days. Your symptoms go away and then come back. Get help right away if: You have very bad back pain. You have very bad pain in your lower belly. You have a fever. You have chills. You feeling like you will vomit or you vomit. Summary A urinary tract infection (UTI) is an infection of any part of the urinary tract. This condition is caused by germs in your genital area. There are many risk factors for a UTI. Treatment includes antibiotic medicines. Drink enough fluid to keep your pee pale yellow. This information is not intended to replace advice given to you by your health care provider. Make sure you discuss any questions you have with your health care provider. Document Revised: 06/30/2020 Document Reviewed: 07/05/2020 Elsevier Patient Education    2024 [employer]  

## 2023-07-08 NOTE — Progress Notes (Signed)
Assessment and Plan:  Sonya Peterson was seen today for an episodic visit.  Diagnoses and all order for this visit:  Dysuria Stay well hydrated to keep urinary system well flushed Consider daily cranberry juice or oral supplement to help any bacteria from adhering to bladder wall causing increase for infection. Monitor for any increase in fever, chills, N/V, abdominal pain, hematuria.   Contact office or report to ER for further evaluation if s/s fail to improve or any sign of worsening infection as noted above.  - Urinalysis, Routine w reflex microscopic - Urine Culture - ciprofloxacin (CIPRO) 250 MG tablet; Take 1 tablet 2 x /day with Food for Infection  Dispense: 14 tablet; Refill: 0  Micturition frequency/urgency  - Urinalysis, Routine w reflex microscopic  Suprapubic pain OTC Tylenol PRN as directed  - Urinalysis, Routine w reflex microscopic  Vaginal candida Start Diflcan as needed for secondary vaginal candida related to tmt of UTI with abx.  - fluconazole (DIFLUCAN) 150 MG tablet; Take 1 tablet (150 mg) at the sign of symptoms.  If not resolved after 72 hours may take additional 150 mg dosage.  Dispense: 3 tablet; Refill: 0  Notify office for further evaluation and treatment, questions or concerns if s/s fail to improve. The risks and benefits of my recommendations, as well as other treatment options were discussed with the patient today. Questions were answered.  Further disposition pending results of labs. Discussed med's effects and SE's.    Over 15 minutes of exam, counseling, chart review, and critical decision making was performed.   Future Appointments  Date Time Provider Department Center  08/23/2023  2:30 PM Raynelle Dick, NP [facility] None  02/21/2024  2:00 PM Raynelle Dick, NP [facility] None    ------------------------------------------------------------------------------------------------------------------   HPI BP 130/88   Pulse 75   Temp  97.7 F (36.5 C)   Ht 5\' 6"  (1.676 m)   Wt 194 lb 6.4 oz (88.2 kg)   SpO2 93%   BMI 31.38 kg/m   Patient complains of dysuria, frequency, and urgency, and suprapubic pain. She has had symptoms for 1 week. Patient also complains of vaginal discharge. Patient denies back pain, fever, and stomach ache. Patient does not have a history of recurrent UTI. Patient does not have a history of pyelonephritis. She has not taken any medication for tmt.  Past Medical History:  Diagnosis Date   Allergy    Anxiety    Arthritis    Asthma    Bronchitis    Depression    OCD   History of hepatitis B    History of kidney stones    Hyperlipidemia    Hypertension    Obesity (BMI 30.0-34.9)    Prediabetes 11/09/2013   Vitamin D deficiency      No Known Allergies  Current Outpatient Medications on File Prior to Visit  Medication Sig   albuterol (VENTOLIN HFA) 108 (90 Base) MCG/ACT inhaler Inhale 2 puffs into the lungs every 6 (six) hours as needed for wheezing or shortness of breath.   Budeson-Glycopyrrol-Formoterol (BREZTRI AEROSPHERE) 160-9-4.8 MCG/ACT AERO Inhale 2 puffs into the lungs in the morning and at bedtime.   Cholecalciferol (VITAMIN D3) 5000 units TABS Take 5,000 Units by mouth at bedtime.   FLUoxetine (PROZAC) 20 MG capsule TAKE 3 CAPSULES DAILY FOR MOOD (Patient taking differently: Take 60 mg by mouth daily. Take 3 capsules Daily for Mood)   loratadine (CLARITIN) 10 MG tablet Take 1 tablet (10 mg total) by mouth  daily.   montelukast (SINGULAIR) 10 MG tablet TAKE 1 TABLET DAILY FOR ALLERGIES   simvastatin (ZOCOR) 40 MG tablet TAKE 1 TABLET AT BEDTIME FOR CHOLESTEROL   telmisartan (MICARDIS) 80 MG tablet TAKE 1 TABLET DAILY FOR BLOOD PRESSURE (Patient taking differently: Take 40 mg by mouth daily. TAKE 0.5 TABLET DAILY FOR BLOOD PRESSURE)   Current Facility-Administered Medications on File Prior to Visit  Medication   0.9 %  sodium chloride infusion    ROS: all negative except what  is noted in the HPI.   Physical Exam:  BP 130/88   Pulse 75   Temp 97.7 F (36.5 C)   Ht 5\' 6"  (1.676 m)   Wt 194 lb 6.4 oz (88.2 kg)   SpO2 93%   BMI 31.38 kg/m   General Appearance: NAD.  Awake, conversant and cooperative. Eyes: PERRLA, EOMs intact.  Sclera white.  Conjunctiva without erythema. Sinuses: No frontal/maxillary tenderness.  No nasal discharge. Nares patent.  ENT/Mouth: Ext aud canals clear.  Bilateral TMs w/DOL and without erythema or bulging. Hearing intact.  Posterior pharynx without swelling or exudate.  Tonsils without swelling or erythema.  Neck: Supple.  No masses, nodules or thyromegaly. Respiratory: Effort is regular with non-labored breathing. Breath sounds are equal bilaterally without rales, rhonchi, wheezing or stridor.  Cardio: RRR with no MRGs. Brisk peripheral pulses without edema.  Abdomen: Active BS in all four quadrants.  Soft and non-tender without guarding, rebound tenderness, hernias or masses. Lymphatics: Non tender without lymphadenopathy.  Musculoskeletal: Full ROM, 5/5 strength, normal ambulation.  No clubbing or cyanosis. Skin: Appropriate color for ethnicity. Warm without rashes, lesions, ecchymosis, ulcers.  Neuro: CN II-XII grossly normal. Normal muscle tone without cerebellar symptoms and intact sensation.   Psych: AO X 3,  appropriate mood and affect, insight and judgment.     Adela Glimpse, NP 10:21 AM [facility]

## 2023-07-25 ENCOUNTER — Other Ambulatory Visit: Payer: Self-pay

## 2023-07-25 ENCOUNTER — Emergency Department (HOSPITAL_BASED_OUTPATIENT_CLINIC_OR_DEPARTMENT_OTHER)
Admission: EM | Admit: 2023-07-25 | Discharge: 2023-07-25 | Disposition: A | Discharge status: Home / Self Care | Attending: Emergency Medicine | Admitting: Emergency Medicine

## 2023-07-25 ENCOUNTER — Encounter (HOSPITAL_BASED_OUTPATIENT_CLINIC_OR_DEPARTMENT_OTHER): Payer: Self-pay | Admitting: Emergency Medicine

## 2023-07-25 DIAGNOSIS — H5712 Ocular pain, left eye: Secondary | ICD-10-CM | POA: Diagnosis present

## 2023-07-25 DIAGNOSIS — H00015 Hordeolum externum left lower eyelid: Secondary | ICD-10-CM | POA: Insufficient documentation

## 2023-07-25 MED ORDER — POLYMYXIN B-TRIMETHOPRIM 10000-0.1 UNIT/ML-% OP SOLN: 1.0000 [drp] | OPHTHALMIC | 0 refills | Status: DC

## 2023-07-25 NOTE — ED Provider Notes (Signed)
[place] EMERGENCY DEPARTMENT AT [facility] Provider Note   CSN: [id number] Arrival date & time: 07/25/23  2144     History  Chief Complaint  Patient presents with   Eye Pain    Sonya Peterson is a 67 y.o. female.  67 yo F left eye pain.  Going on for about a week.  Thought she had a stye but thought it was improved but has extended down to the cheek. Usually worse in the morning.  Had a pustule at one point.  No fevers. Does not wear contact lenses.    Eye Pain       Home Medications Prior to Admission medications   Medication Sig Start Date End Date Taking? Authorizing Provider  trimethoprim-polymyxin b (POLYTRIM) ophthalmic solution Place 1 drop into the right eye every 4 (four) hours. 07/25/23  Yes Melene Plan, DO  albuterol (VENTOLIN HFA) 108 (90 Base) MCG/ACT inhaler Inhale 2 puffs into the lungs every 6 (six) hours as needed for wheezing or shortness of breath. 08/27/22   Raynelle Dick, NP  Budeson-Glycopyrrol-Formoterol (BREZTRI AEROSPHERE) 160-9-4.8 MCG/ACT AERO Inhale 2 puffs into the lungs in the morning and at bedtime. 09/17/22   Charlott Holler, MD  Cholecalciferol (VITAMIN D3) 5000 units TABS Take 5,000 Units by mouth at bedtime.    [provider]  ciprofloxacin (CIPRO) 250 MG tablet Take 1 tablet 2 x /day with Food for Infection 07/08/23   Adela Glimpse, NP  fluconazole (DIFLUCAN) 150 MG tablet Take 1 tablet (150 mg) at the sign of symptoms.  If not resolved after 72 hours may take additional 150 mg dosage. 07/08/23   Adela Glimpse, NP  FLUoxetine (PROZAC) 20 MG capsule TAKE 3 CAPSULES DAILY FOR MOOD Patient taking differently: Take 60 mg by mouth daily. Take 3 capsules Daily for Mood 05/29/22   Lucky Cowboy, MD  loratadine (CLARITIN) 10 MG tablet Take 1 tablet (10 mg total) by mouth daily. 02/18/23 02/18/24  Raynelle Dick, NP  montelukast (SINGULAIR) 10 MG tablet TAKE 1 TABLET DAILY FOR ALLERGIES 07/05/23   Raynelle Dick, NP   simvastatin (ZOCOR) 40 MG tablet TAKE 1 TABLET AT BEDTIME FOR CHOLESTEROL 04/26/23   Raynelle Dick, NP  telmisartan (MICARDIS) 80 MG tablet TAKE 1 TABLET DAILY FOR BLOOD PRESSURE Patient taking differently: Take 40 mg by mouth daily. TAKE 0.5 TABLET DAILY FOR BLOOD PRESSURE 09/26/21   Judd Gaudier, NP      Allergies    Patient has no known allergies.    Review of Systems   Review of Systems  Eyes:  Positive for pain.    Physical Exam Updated Vital Signs BP (!) 164/81   Pulse 85   Temp 99 F (37.2 C) (Oral)   Resp (!) 22   Wt 88.2 kg   SpO2 92%   BMI 31.38 kg/m  Physical Exam Vitals and nursing note reviewed.  Constitutional:      General: She is not in acute distress.    Appearance: She is well-developed. She is not diaphoretic.  HENT:     Head: Normocephalic and atraumatic.  Eyes:     Pupils: Pupils are equal, round, and reactive to light.     Comments: Patient has localized erythema and mild fluctuance of the medial aspect of the left lower lid.  Seems to avoid the canthus.  There is some purulent drainage from the medial aspect of this.  No obvious conjunctival injection.  No hyphema.  Extraocular motion intact.  Pupil equal round and reactive to light.  Cardiovascular:     Rate and Rhythm: Normal rate and regular rhythm.     Heart sounds: No murmur heard.    No friction rub. No gallop.  Pulmonary:     Effort: Pulmonary effort is normal.     Breath sounds: No wheezing or rales.  Abdominal:     General: There is no distension.     Palpations: Abdomen is soft.     Tenderness: There is no abdominal tenderness.  Musculoskeletal:        General: No tenderness.     Cervical back: Normal range of motion and neck supple.  Skin:    General: Skin is warm and dry.  Neurological:     Mental Status: She is alert and oriented to person, place, and time.  Psychiatric:        Behavior: Behavior normal.     ED Results / Procedures / Treatments   Labs (all labs  ordered are listed, but only abnormal results are displayed) Labs Reviewed - No data to display  EKG None  Radiology No results found.  Procedures Procedures    Medications Ordered in ED Medications - No data to display  ED Course/ Medical Decision Making/ A&P                                 Medical Decision Making Risk Prescription drug management.   67 yo F with a cc of left eye pain.  Clinically with a stye.  Continue warm compresses's.  Add topical antibiotics.  Optho follow up.   10:34 PM:  I have discussed the diagnosis/risks/treatment options with the patient.  Evaluation and diagnostic testing in the emergency department does not suggest an emergent condition requiring admission or immediate intervention beyond what has been performed at this time.  They will follow up with Optho, PCP. We also discussed returning to the ED immediately if new or worsening sx occur. We discussed the sx which are most concerning (e.g., sudden worsening pain, fever, inability to tolerate by mouth) that necessitate immediate return. Medications administered to the patient during their visit and any new prescriptions provided to the patient are listed below.  Medications given during this visit Medications - No data to display   The patient appears reasonably screen and/or stabilized for discharge and I doubt any other medical condition or other [facility] requiring further screening, evaluation, or treatment in the ED at this time prior to discharge.          Final Clinical Impression(s) / ED Diagnoses Final diagnoses:  Hordeolum externum of left lower eyelid    Rx / DC Orders ED Discharge Orders          Ordered    trimethoprim-polymyxin b (POLYTRIM) ophthalmic solution  Every 4 hours        07/25/23 2212              Melene Plan, DO 07/25/23 2234

## 2023-07-25 NOTE — Discharge Instructions (Signed)
Continue warm compresses.  Please follow-up with an ophthalmologist in the office.  I gave the information for the ophthalmologist that we have on-call here today.

## 2023-07-25 NOTE — ED Triage Notes (Signed)
Pt in with L eye pain and swelling since waking yesterday morning. Pt states she felt she had the beginnings of a stye, and had been using warm compresses but this has not helped. Reporting some blurred vision to L eye

## 2023-07-25 NOTE — ED Notes (Signed)
Patient verbalizes understanding of discharge instructions. Opportunity for questioning and answers were provided. Armband removed by staff, pt discharged from ED. Ambulated out to lobby  

## 2023-07-25 NOTE — ED Notes (Signed)
Pt has audible wheezes on entry to room, states "this is normal for me, I have allergies and asthma". RT to assess

## 2023-07-25 NOTE — ED Notes (Signed)
RT Note: Nurse alerted respiratory that this patient was not here for any breathing issues but she heard the patient wheezing at times during the assessment. When patient was checked by RT , the patient stated that the wheezing was normal for her because she was stressed. The patient stated she did not need a breathing treatment or anything at this time. The patient reminded me that she wasn't here for her breathing. Patient was encouraged to let us know if she changed her mind and felt the need for a breathing treatment.

## 2023-08-23 ENCOUNTER — Encounter: Payer: Self-pay | Admitting: Nurse Practitioner

## 2023-08-23 ENCOUNTER — Ambulatory Visit (INDEPENDENT_AMBULATORY_CARE_PROVIDER_SITE_OTHER): Admitting: Nurse Practitioner

## 2023-08-23 VITALS — BP 120/78 | HR 76 | Temp 97.7°F | Ht 64.5 in | Wt 191.6 lb

## 2023-08-23 DIAGNOSIS — R7309 Other abnormal glucose: Secondary | ICD-10-CM

## 2023-08-23 DIAGNOSIS — F3341 Major depressive disorder, recurrent, in partial remission: Secondary | ICD-10-CM

## 2023-08-23 DIAGNOSIS — Z23 Encounter for immunization: Secondary | ICD-10-CM | POA: Diagnosis not present

## 2023-08-23 DIAGNOSIS — E669 Obesity, unspecified: Secondary | ICD-10-CM

## 2023-08-23 DIAGNOSIS — E785 Hyperlipidemia, unspecified: Secondary | ICD-10-CM | POA: Diagnosis not present

## 2023-08-23 DIAGNOSIS — I10 Essential (primary) hypertension: Secondary | ICD-10-CM

## 2023-08-23 DIAGNOSIS — E559 Vitamin D deficiency, unspecified: Secondary | ICD-10-CM

## 2023-08-23 DIAGNOSIS — Z79899 Other long term (current) drug therapy: Secondary | ICD-10-CM

## 2023-08-23 DIAGNOSIS — J4540 Moderate persistent asthma, uncomplicated: Secondary | ICD-10-CM

## 2023-08-23 NOTE — Patient Instructions (Signed)
Asthma, Adult  Asthma is a long-term (chronic) condition that causes recurrent episodes in which the lower airways in the lungs become tight and narrow. The narrowing is caused by inflammation and tightening of the smooth muscle around the lower airways. Asthma episodes, also called asthma attacks or asthma flares, may cause coughing, making high-pitched whistling sounds when you breathe, most often when you breathe out (wheezing), shortness of breath, and chest pain. The airways may produce extra mucus caused by the inflammation and irritation. During an attack, it can be difficult to breathe. Asthma attacks can range from minor to life-threatening. Asthma cannot be cured, but medicines and lifestyle changes can help control it and treat acute attacks. It is important to keep your asthma well controlled so the condition does not interfere with your daily life. What are the causes? This condition is believed to be caused by inherited (genetic) and environmental factors, but its exact cause is not known. What can trigger an asthma attack? Many things can bring on an asthma attack or make symptoms worse. These triggers are different for every person. Common triggers include: Allergens and irritants like mold, dust, pet dander, cockroaches, pollen, air pollution, and chemical odors. Cigarette smoke. Weather changes and cold air. Stress and strong emotional responses such as crying or laughing hard. Certain medications such as aspirin or beta blockers. Infections and inflammatory conditions, such as the flu, a cold, pneumonia, or inflammation of the nasal membranes (rhinitis). Gastroesophageal reflux disease (GERD). What are the signs or symptoms? Symptoms may occur right after exposure to an asthma trigger or hours later and can vary by person. Common signs and symptoms include: Wheezing. Trouble breathing (shortness of breath). Excessive nighttime or early morning coughing. Chest  tightness. Tiredness (fatigue) with minimal activity. Difficulty talking in complete sentences. Poor exercise tolerance. How is this diagnosed? This condition is diagnosed based on: A physical exam and your medical history. Tests, which may include: Lung function studies to evaluate the flow of air in your lungs. Allergy tests. Imaging tests, such as X-rays. How is this treated? There is no cure, but symptoms can be controlled with proper treatment. Treatment usually involves: Identifying and avoiding your asthma triggers. Inhaled medicines. Two types are commonly used to treat asthma, depending on severity: Controller medicines. These help prevent asthma symptoms from occurring. They are taken every day. Fast-acting reliever or rescue medicines. These quickly relieve asthma symptoms. They are used as needed and provide short-term relief. Using other medicines, such as: Allergy medicines, such as antihistamines, if your asthma attacks are triggered by allergens. Immune medicines (immunomodulators). These are medicines that help control the immune system. Using supplemental oxygen. This is only needed during a severe episode. Creating an asthma action plan. An asthma action plan is a written plan for managing and treating your asthma attacks. This plan includes: A list of your asthma triggers and how to avoid them. Information about when medicines should be taken and when their dosage should be changed. Instructions about using a device called a peak flow meter. A peak flow meter measures how well the lungs are working and the severity of your asthma. It helps you monitor your condition. Follow these instructions at home: Take over-the-counter and prescription medicines only as told by your health care provider. Stay up to date on all vaccinations as recommended by your healthcare provider, including vaccines for the flu and pneumonia. Use a peak flow meter and keep track of your peak flow  readings. Understand and use your asthma  action plan to address any asthma flares. Do not smoke or allow anyone to smoke in your home. Contact a health care provider if: You have wheezing, shortness of breath, or a cough that is not responding to medicines. Your medicines are causing side effects, such as a rash, itching, swelling, or trouble breathing. You need to use a reliever medicine more than 2-3 times a week. Your peak flow reading is still at 50-79% of your personal best after following your action plan for 1 hour. You have a fever and shortness of breath. Get help right away if: You are getting worse and do not respond to treatment during an asthma attack. You are short of breath when at rest or when doing very little physical activity. You have difficulty eating, drinking, or talking. You have chest pain or tightness. You develop a fast heartbeat or palpitations. You have a bluish color to your lips or fingernails. You are light-headed or dizzy, or you faint. Your peak flow reading is less than 50% of your personal best. You feel too tired to breathe normally. These symptoms may be an emergency. Get help right away. Call 911. Do not wait to see if the symptoms will go away. Do not drive yourself to the hospital. Summary Asthma is a long-term (chronic) condition that causes recurrent episodes in which the airways become tight and narrow. Asthma episodes, also called asthma attacks or asthma flares, can cause coughing, wheezing, shortness of breath, and chest pain. Asthma cannot be cured, but medicines and lifestyle changes can help keep it well controlled and prevent asthma flares. Make sure you understand how to avoid triggers and how and when to use your medicines. Asthma attacks can range from minor to life-threatening. Get help right away if you have an asthma attack and do not respond to treatment with your usual rescue medicines. This information is not intended to replace  advice given to you by your health care provider. Make sure you discuss any questions you have with your health care provider. Document Revised: 09/10/2021 Document Reviewed: 09/01/2021 Elsevier Patient Education  2024 [employer].

## 2023-08-23 NOTE — Progress Notes (Addendum)
Assessment and Plan:   Sonya Peterson was seen today for follow-up.  Diagnoses and all orders for this visit:  Essential hypertension Continue current medications: Telmisartan 80mg  1 tab daily Monitor blood pressure at home; call if consistently over 130/80 Continue DASH diet.   Reminder to go to the ER if any CP, SOB, nausea, dizziness, severe HA, changes vision/speech, left arm numbness and tingling and jaw pain. -     CBC with Differential/Platelet -     COMPLETE METABOLIC PANEL WITH GFR  Hyperlipidemia, unspecified hyperlipidemia type Continue medications:simvastatin 40mg  nightly Discussed dietary and exercise modifications Low fat diet -     Lipid panel  Abnormal glucose Discussed dietary and exercise modifications - CMP  Obesity Long discussion about weight loss, diet, and exercise Recommended diet heavy in fruits and veggies and low in animal meats, cheeses, and dairy products, appropriate calorie intake Patient will work on decreasing saturated fat, simple carbs.  Increase activity Follow up at next visit - TSH   Recurrent major depressive disorder, in partial remission (HCC) Currently on Prozac 20 mg 3 tabs daily and symptoms are well controlled Discussed stress management techniques  Discussed, increase water,intake & good sleep hygiene  Discussed increasing exercise & vegetables in diet   Vitamin D Def Continue Vit D supplementation to maintain value in therapeutic level of 60-100   Medication management -     CBC with Differential/Platelet -     COMPLETE METABOLIC PANEL WITH GFR -     Lipid panel -     TSH  Moderate persistent asthma in adult without complication Continue Claritin, Breztri, Montelukast daily and Albuterol PRN. Once she finishes current script of Claritin advised she alternate between Zyrtec and Allegra every 6 months Did not take Breztri today- increased wheezing, encouraged to use daily -     Flu vaccine HIGH DOSE PF  Flu vaccine need -      Flu vaccine HIGH DOSE PF         Continue diet and meds as discussed. Further disposition pending results of labs. Future Appointments  Date Time Provider Department Center  02/21/2024  2:00 PM Raynelle Dick, NP [facility] None    HPI 67 y.o. female  presents for 3 month follow up with HTN, HLD, intermittent asthma, depression, prediabetes, weight management and vitamin D deficiency  She reports overall she is doing well.  She does not have any health or medications concerns today.  She is drinking about 2 bottles of water a day, reports she knows she should be drinking more. Lab Results  Component Value Date   EGFR 90 02/18/2023    She has history of allergies, asthma, on allergy pill. She continues on Claritin, Breztri, Montelukast daily and Albuterol PRN  BP is well controlled with Telmisartan 80 mg every day   Denies headaches, chest pain, shortness of breath and dizziness.  BP Readings from Last 3 Encounters:  08/23/23 120/78  07/25/23 (!) 164/81  07/08/23 130/88  She does not work out   She is on Simvastatin 40 mg every day for cholesterol and denies myalgias.   Lab Results  Component Value Date   CHOL 160 02/18/2023   HDL 38 (L) 02/18/2023   LDLCALC 100 (H) 02/18/2023   TRIG 121 02/18/2023   CHOLHDL 4.2 02/18/2023     BMI is Body mass index is 32.38 kg/m., she has been working on diet and exercise. Wt Readings from Last 3 Encounters:  08/23/23 191 lb 9.6 oz (86.9 kg)  07/25/23 194 lb 6.4 oz (88.2 kg)  07/08/23 194 lb 6.4 oz (88.2 kg)    She has been working on diet and exercise for prediabetes, and denies paresthesia of the feet, polydipsia, polyuria and visual disturbances. Last A1C in the office was:  Lab Results  Component Value Date   HGBA1C 5.9 (H) 02/18/2023   Patient is on Vitamin D supplement.   Lab Results  Component Value Date   VD25OH 70 02/18/2023      Current Medications:     Current Outpatient Medications (Cardiovascular):     simvastatin (ZOCOR) 40 MG tablet, TAKE 1 TABLET AT BEDTIME FOR CHOLESTEROL   telmisartan (MICARDIS) 80 MG tablet, TAKE 1 TABLET DAILY FOR BLOOD PRESSURE (Patient taking differently: Take 40 mg by mouth daily. TAKE 0.5 TABLET DAILY FOR BLOOD PRESSURE)   Current Outpatient Medications (Respiratory):    albuterol (VENTOLIN HFA) 108 (90 Base) MCG/ACT inhaler, Inhale 2 puffs into the lungs every 6 (six) hours as needed for wheezing or shortness of breath.   Budeson-Glycopyrrol-Formoterol (BREZTRI AEROSPHERE) 160-9-4.8 MCG/ACT AERO, Inhale 2 puffs into the lungs in the morning and at bedtime.   loratadine (CLARITIN) 10 MG tablet, Take 1 tablet (10 mg total) by mouth daily.   montelukast (SINGULAIR) 10 MG tablet, TAKE 1 TABLET DAILY FOR ALLERGIES       Current Outpatient Medications (Other):    Cholecalciferol (VITAMIN D3) 5000 units TABS, Take 5,000 Units by mouth at bedtime.   FLUoxetine (PROZAC) 20 MG capsule, TAKE 3 CAPSULES DAILY FOR MOOD (Patient taking differently: Take 60 mg by mouth daily. Take 3 capsules Daily for Mood)  Current Facility-Administered Medications (Other):    0.9 %  sodium chloride infusion  Medical History:  Past Medical History:  Diagnosis Date   Allergy    Anxiety    Arthritis    Asthma    Bronchitis    Depression    OCD   History of hepatitis B    History of kidney stones    Hyperlipidemia    Hypertension    Obesity (BMI 30.0-34.9)    Prediabetes 11/09/2013   Vitamin D deficiency    Allergies: No Known Allergies   Review of Systems  Constitutional:  Negative for chills, diaphoresis, fever, malaise/fatigue and weight loss.  HENT:  Positive for congestion. Negative for ear discharge, ear pain, hearing loss, nosebleeds, sinus pain, sore throat and tinnitus.   Eyes:  Negative for blurred vision, double vision, photophobia, pain, discharge and redness.  Respiratory:  Positive for cough (intermittent), shortness of breath (intermittent) and wheezing  (intermittent). Negative for hemoptysis, sputum production and stridor.   Cardiovascular:  Negative for chest pain, palpitations, orthopnea, claudication, leg swelling and PND.  Gastrointestinal:  Negative for abdominal pain, blood in stool, constipation, diarrhea, heartburn, melena, nausea and vomiting.  Genitourinary:  Negative for dysuria, flank pain, frequency, hematuria and urgency.  Musculoskeletal:  Negative for back pain, falls, joint pain, myalgias and neck pain.  Skin:  Negative for itching and rash.  Neurological:  Negative for dizziness, tingling, tremors, sensory change, speech change, focal weakness, seizures, loss of consciousness, weakness and headaches.  Endo/Heme/Allergies:  Negative for environmental allergies and polydipsia. Does not bruise/bleed easily.  Psychiatric/Behavioral:  Negative for depression, hallucinations, memory loss, substance abuse and suicidal ideas. The patient is not nervous/anxious and does not have insomnia.     Names of Other Physician/Practitioners you currently use: 1. [facility] here for primary care 2. Eye doctor, Due for 2021  3. Dental Exam: Due for 2021 Patient Care Team: Lucky Cowboy, MD as PCP - General (Internal Medicine) Nadara Mustard, MD as Consulting Physician (Orthopedic Surgery) Sharrell Ku, MD as Consulting Physician (Gastroenterology) Jerilee Field, MD as Consulting Physician (Urology)    Screening Tests: Immunization History  Administered Date(s) Administered   Influenza Inj Mdck Quad With Preservative 08/16/2017, 09/20/2018, 09/13/2019, 09/18/2020   Influenza Split 08/31/2013, 10/04/2014, 10/09/2015   Influenza, Seasonal, Injecte, Preservative Fre 09/21/2016   Influenza,inj,Quad PF,6+ Mos 09/16/2021, 09/17/2022   PFIZER Comirnaty(Gray Top)Covid-19 Tri-Sucrose Vaccine 01/12/2023   PFIZER(Purple Top)SARS-COV-2 Vaccination 02/17/2020, 03/12/2020, 10/28/2020   RSV IGIV  09/17/2022   Tdap 03/28/2015    Family history- Review and unchanged Social history- Review and unchanged  Physical Exam: BP 120/78   Pulse 76   Temp 97.7 F (36.5 C)   Ht 5' 4.5" (1.638 m)   Wt 191 lb 9.6 oz (86.9 kg)   SpO2 93%   BMI 32.38 kg/m  Wt Readings from Last 3 Encounters:  08/23/23 191 lb 9.6 oz (86.9 kg)  07/25/23 194 lb 6.4 oz (88.2 kg)  07/08/23 194 lb 6.4 oz (88.2 kg)   General Appearance: Well nourished, in no apparent distress. Eyes: PERRLA, EOMs, conjunctiva no swelling or erythema Sinuses: No Frontal/maxillary tenderness ENT/Mouth: Ext aud canals clear, TMs without erythema, bulging. No erythema, swelling, or exudate on post pharynx.  Hearing normal.  Neck: Supple, thyroid normal.  Respiratory: Respiratory effort normal, Expiratory wheezing throughout lungs bilaterally, clears slightly with cough Cardio: RRR with no MRGs. Brisk peripheral pulses without edema.  Abdomen: Soft, + BS.  Non tender, no guarding, rebound, hernias, masses. Lymphatics: Non tender without lymphadenopathy.  Musculoskeletal: Full ROM, 5/5 strength, normal gait.  Skin:  Warm, dry without rashes, lesions, ecchymosis.  Neuro: Cranial nerves intact. Normal muscle tone, no cerebellar symptoms. Sensation intact.  Psych: Awake and oriented X 3, normal affect, Insight and Judgment appropriate.     [facility]  08/23/2023

## 2023-08-24 LAB — CBC WITH DIFFERENTIAL/PLATELET
Absolute Monocytes: 647 {cells}/uL (ref 200–950)
Basophils Absolute: 98 {cells}/uL (ref 0–200)
Basophils Relative: 1 %
Eosinophils Absolute: 715 {cells}/uL — ABNORMAL HIGH (ref 15–500)
Eosinophils Relative: 7.3 %
HCT: 44.5 % (ref 35.0–45.0)
Hemoglobin: 14.9 g/dL (ref 11.7–15.5)
Lymphs Abs: 2675 {cells}/uL (ref 850–3900)
MCH: 29.5 pg (ref 27.0–33.0)
MCHC: 33.5 g/dL (ref 32.0–36.0)
MCV: 88.1 fL (ref 80.0–100.0)
MPV: 9.6 fL (ref 7.5–12.5)
Monocytes Relative: 6.6 %
Neutro Abs: 5664 {cells}/uL (ref 1500–7800)
Neutrophils Relative %: 57.8 %
Platelets: 324 10*3/uL (ref 140–400)
RBC: 5.05 10*6/uL (ref 3.80–5.10)
RDW: 12.6 % (ref 11.0–15.0)
Total Lymphocyte: 27.3 %
WBC: 9.8 10*3/uL (ref 3.8–10.8)

## 2023-08-24 LAB — COMPLETE METABOLIC PANEL WITH GFR
AG Ratio: 1.5 (calc) (ref 1.0–2.5)
ALT: 10 U/L (ref 6–29)
AST: 13 U/L (ref 10–35)
Albumin: 4.6 g/dL (ref 3.6–5.1)
Alkaline phosphatase (APISO): 94 U/L (ref 37–153)
BUN: 15 mg/dL (ref 7–25)
CO2: 27 mmol/L (ref 20–32)
Calcium: 9.5 mg/dL (ref 8.6–10.4)
Chloride: 102 mmol/L (ref 98–110)
Creat: 0.69 mg/dL (ref 0.50–1.05)
Globulin: 3 g/dL (ref 1.9–3.7)
Glucose, Bld: 84 mg/dL (ref 65–99)
Potassium: 4.3 mmol/L (ref 3.5–5.3)
Sodium: 138 mmol/L (ref 135–146)
Total Bilirubin: 0.4 mg/dL (ref 0.2–1.2)
Total Protein: 7.6 g/dL (ref 6.1–8.1)
eGFR: 97 mL/min/{1.73_m2} (ref >=60)

## 2023-08-24 LAB — LIPID PANEL
Cholesterol: 145 mg/dL (ref <200)
HDL: 36 mg/dL — ABNORMAL LOW (ref >=50)
LDL Cholesterol (Calc): 85 mg/dL
Non-HDL Cholesterol (Calc): 109 mg/dL (ref <130)
Total CHOL/HDL Ratio: 4 (calc) (ref <5.0)
Triglycerides: 141 mg/dL (ref <150)

## 2023-08-24 LAB — TSH: TSH: 0.96 m[IU]/L (ref 0.40–4.50)

## 2023-10-21 ENCOUNTER — Other Ambulatory Visit: Payer: Self-pay | Admitting: Internal Medicine

## 2023-10-21 DIAGNOSIS — J4531 Mild persistent asthma with (acute) exacerbation: Secondary | ICD-10-CM

## 2023-10-22 ENCOUNTER — Other Ambulatory Visit: Payer: Self-pay | Admitting: Internal Medicine

## 2023-10-22 DIAGNOSIS — F3341 Major depressive disorder, recurrent, in partial remission: Secondary | ICD-10-CM

## 2023-10-25 ENCOUNTER — Encounter: Payer: Self-pay | Admitting: Nurse Practitioner

## 2023-10-26 ENCOUNTER — Other Ambulatory Visit: Payer: Self-pay | Admitting: Nurse Practitioner

## 2023-10-26 DIAGNOSIS — F3341 Major depressive disorder, recurrent, in partial remission: Secondary | ICD-10-CM

## 2023-10-26 MED ORDER — FLUOXETINE HCL 20 MG PO CAPS: 60.0000 mg | ORAL_CAPSULE | Freq: Every day | ORAL | Status: DC

## 2023-10-26 NOTE — Progress Notes (Signed)
Prozac has been decreased from 60 mg daily to 40 mg due to age recommendations on dosage.  Will monitor symptoms and call if symptoms are not controlled with lower dose.

## 2023-12-16 ENCOUNTER — Ambulatory Visit (INDEPENDENT_AMBULATORY_CARE_PROVIDER_SITE_OTHER): Payer: PRIVATE HEALTH INSURANCE | Admitting: Internal Medicine

## 2023-12-16 ENCOUNTER — Encounter: Payer: Self-pay | Admitting: Internal Medicine

## 2023-12-16 VITALS — BP 148/85 | HR 65 | Ht 66.0 in | Wt 189.2 lb

## 2023-12-16 DIAGNOSIS — J309 Allergic rhinitis, unspecified: Secondary | ICD-10-CM | POA: Diagnosis not present

## 2023-12-16 DIAGNOSIS — J4540 Moderate persistent asthma, uncomplicated: Secondary | ICD-10-CM

## 2023-12-16 DIAGNOSIS — D7219 Other eosinophilia: Secondary | ICD-10-CM

## 2023-12-16 LAB — PULMONARY FUNCTION TEST
DL/VA % pred: 126 %
DL/VA: 5.25 ml/min/mmHg/L
DLCO cor % pred: 98 %
DLCO cor: 19.79 ml/min/mmHg
DLCO unc % pred: 98 %
DLCO unc: 19.79 ml/min/mmHg
FEF 25-75 Post: 1.93 L/s
FEF 25-75 Pre: 0.52 L/s
FEF2575-%Change-Post: 267 %
FEF2575-%Pred-Post: 89 %
FEF2575-%Pred-Pre: 24 %
FEV1-%Change-Post: 55 %
FEV1-%Pred-Post: 56 %
FEV1-%Pred-Pre: 36 %
FEV1-Post: 1.39 L
FEV1-Pre: 0.89 L
FEV1FVC-%Change-Post: 4 %
FEV1FVC-%Pred-Pre: 87 %
FEV6-%Change-Post: 44 %
FEV6-%Pred-Post: 61 %
FEV6-%Pred-Pre: 42 %
FEV6-Post: 1.9 L
FEV6-Pre: 1.32 L
FEV6FVC-%Change-Post: -3 %
FEV6FVC-%Pred-Post: 100 %
FEV6FVC-%Pred-Pre: 104 %
FVC-%Change-Post: 49 %
FVC-%Pred-Post: 60 %
FVC-%Pred-Pre: 40 %
FVC-Post: 1.96 L
FVC-Pre: 1.32 L
Post FEV1/FVC ratio: 71 %
Post FEV6/FVC ratio: 97 %
Pre FEV1/FVC ratio: 68 %
Pre FEV6/FVC Ratio: 100 %
RV % pred: 176 %
RV: 3.74 L
TLC % pred: 109 %
TLC: 5.6 L

## 2023-12-16 MED ORDER — AZELASTINE HCL 0.1 % NA SOLN: 1.0000 | Freq: Two times a day (BID) | NASAL | 12 refills | Status: AC

## 2023-12-16 MED ORDER — FLUTICASONE-SALMETEROL 230-21 MCG/ACT IN AERO
2.0000 | INHALATION_SPRAY | Freq: Two times a day (BID) | RESPIRATORY_TRACT | 12 refills | Status: DC
Start: 2023-12-16 — End: 2023-12-20

## 2023-12-16 NOTE — Patient Instructions (Addendum)
 It was a pleasure to see you today!  Please schedule follow up scheduled with myself in 3 months - ok to do a virtual visit.  If my schedule is not open yet, we will contact you with a reminder closer to that time. Please call [phone] if you haven't heard from us  a month before, and always call us  sooner if issues or concerns arise. You can also send us  a message through MyChart, but but aware that this is not to be used for urgent issues and it may take up to 5-7 days to receive a reply. Please be aware that you will likely be able to view your results before I have a chance to respond to them. Please give us  5 business days to respond to any non-urgent results.    Continue cetirizine  and sinulair.  Let's add astelin  nasal spray for allergies.  Allergy  testing today - blood work  I am changing you to high dose advair  230 2 puffs twice daily - this will give you more inhaled steroid than breztri  does and hopefully will improve asthma symptoms.   Astelin  - 1 spray on each side of your nose twice a day for first week, then 1 spray on each side.   Instructions for use: If you also use a saline nasal spray or rinse, use that first. Position the head with the chin slightly tucked. Use the right hand to spray into the left nostril and the right hand to spray into the left nostril.   Point the bottle away from the septum of your nose (cartilage that divides the two sides of your nose).  Hold the nostril closed on the opposite side from where you will spray Spray once and gently sniff to pull the medicine into the higher parts of your nose.  Don't sniff too hard as the medicine will drain down the back of your throat instead. Repeat with a second spray on the same side if prescribed. Repeat on the other side of your nose.

## 2023-12-16 NOTE — Progress Notes (Signed)
 Sonya Peterson    [id number]    [birth year]  Primary Care Physician:McKeown, Elsie, MD Date of Appointment: 12/16/2023 Established Patient Visit  Chief complaint:   Chief Complaint  Patient presents with   Follow-up    Pft f/u, states asthma has been up and down due to weather. Using albuterol  and breztri       HPI: Sonya Peterson is a 68 y.o. woman with moderate persistent asthma.   Interval Updates: Here for follow up after PFTs. Feels breathing has worsened over the past year.   No prednisone  courses in the last year.  Uses albuterol  1-2 times/week.   Having worsening cough at night and in the morning. Occasionally wakes her up.  Worsening nasal congestion in the morning.  Hoarse voice.  Denies reflux.   Current Regimen: Breztri  2 puffs twice daily, albuterol  prn Asthma Triggers: stress, cold, heat, exertion Exacerbations in the last year: none requiring prednisone .  History of hospitalization or intubation: Allergy  Testing/rhinitis: yes on claritin  and singulair  GERD: denies ACT:  Asthma Control Test ACT Total Score  12/16/2023 10:10 AM 13  12/18/2022  2:23 PM 22  09/17/2022  2:14 PM 23   FeNO: obtained 1/9 18 ppb Serum Eos/IgE: eos 500-700  I have reviewed the patient's family social and past medical history and updated as appropriate.   Past Medical History:  Diagnosis Date   Allergy     Anxiety    Arthritis    Asthma    Bronchitis    Depression    OCD   History of hepatitis B    History of kidney stones    Hyperlipidemia    Hypertension    Obesity (BMI 30.0-34.9)    Prediabetes 11/09/2013   Vitamin D  deficiency     Past Surgical History:  Procedure Laterality Date   ACHILLES TENDON SURGERY     BACK SURGERY     BREAST SURGERY Left    lump removed left   DILATION AND CURETTAGE OF UTERUS     EXTRACORPOREAL SHOCK WAVE LITHOTRIPSY Left 10/23/2019   Procedure: EXTRACORPOREAL SHOCK WAVE LITHOTRIPSY (ESWL);  Surgeon: Nieves Cough, MD;  Location: [facility];  Service: Urology;  Laterality: Left;   HEEL SPUR SURGERY Left 2015   I & D EXTREMITY Right 08/06/2017   Procedure: IRRIGATION AND DEBRIDEMENT RIGHT ACHILLES;  Surgeon: Harden Jerona GAILS, MD;  Location: [facility] OR;  Service: Orthopedics;  Laterality: Right;   LUMBAR DISC SURGERY  2004   TUBAL LIGATION      Family History  Problem Relation Age of Onset   Cancer Mother 10       breast   Hypertension Mother    Diabetes Mother    Heart disease Mother    Cancer Father 59       prostate   Heart disease Maternal Grandfather        heart failure   Colon cancer Paternal Grandmother    Crohn's disease Daughter     Social History   Occupational History   Not on file  Tobacco Use   Smoking status: Never    Passive exposure: Past   Smokeless tobacco: Never  Vaping Use   Vaping status: Never Used  Substance and Sexual Activity   Alcohol use: No    Comment: ocassionaly   Drug use: No   Sexual activity: Not on file     Physical Exam: Blood pressure (!) 148/85, pulse 65, height 5' 6 (1.676 m), weight  189 lb 3.2 oz (85.8 kg), SpO2 93%.  Gen:      No acute distress, hoarse voice. ENT:  no nasal polyps, mucus membranes moist, cobblestoning in oropharynx Lungs:    No increased respiratory effort, symmetric chest wall excursion, clear to auscultation bilaterally, wheeze best auscultated over the neck.  CV:         Regular rate and rhythm; no murmurs, rubs, or gallops.  No pedal edema   Data Reviewed: Imaging: I have personally reviewed the   PFTs:     Latest Ref Rng & Units 12/16/2023    8:41 AM  PFT Results  FVC-Pre L 1.32  P  FVC-Predicted Pre % 40  P  FVC-Post L 1.96  P  FVC-Predicted Post % 60  P  Pre FEV1/FVC % % 68  P  Post FEV1/FCV % % 71  P  FEV1-Pre L 0.89  P  FEV1-Predicted Pre % 36  P  FEV1-Post L 1.39  P  DLCO uncorrected ml/min/mmHg 19.79  P  DLCO UNC% % 98  P  DLCO corrected ml/min/mmHg 19.79  P  DLCO COR %Predicted % 98  P  DLVA  Predicted % 126  P  TLC L 5.60  P  TLC % Predicted % 109  P  RV % Predicted % 176  P    P Preliminary result   I have personally reviewed the patient's PFTs and normal diffusion capacity and lung volumes. Spiromtery not interpretable due to early glottic closure in the first second of exhalation.   Labs: Lab Results  Component Value Date   WBC 9.8 08/23/2023   HGB 14.9 08/23/2023   HCT 44.5 08/23/2023   MCV 88.1 08/23/2023   PLT 324 08/23/2023   Lab Results  Component Value Date   NA 138 08/23/2023   K 4.3 08/23/2023   CL 102 08/23/2023   CO2 27 08/23/2023    Immunization status: Immunization History  Administered Date(s) Administered   Influenza Inj Mdck Quad With Preservative 08/16/2017, 09/20/2018, 09/13/2019, 09/18/2020   Influenza Split 08/31/2013, 10/04/2014, 10/09/2015   Influenza, High Dose Seasonal PF 08/23/2023   Influenza, Seasonal, Injecte, Preservative Fre 09/21/2016   Influenza,inj,Quad PF,6+ Mos 09/16/2021, 09/17/2022   PFIZER Comirnaty(Gray Top)Covid-19 Tri-Sucrose Vaccine 01/12/2023   PFIZER(Purple Top)SARS-COV-2 Vaccination 02/17/2020, 03/12/2020, 10/28/2020   RSV IGIV 09/17/2022   Tdap 03/28/2015    External Records Personally Reviewed: pulmonary  Assessment:  Moderate persistent asthma not well controlled Peripheral Eosinophilia - AEC 500-700 in the last few years Worsening chronic allergic rhinitis Desaturation without hypoxemia  Plan/Recommendations: Feno obtained today 18 ppb  Continue cetirizine  and sinulair.  Let's add astelin  nasal spray for allergies.  Allergy  testing today - blood work  I am changing you to high dose advair  230 2 puffs twice daily - this will give you more inhaled steroid than breztri  does and hopefully will improve asthma symptoms.   Oxygen saturation with ambulation dropped from 96% ----91%. I've asked her to keep an eye on this. Dlco and lung volumes (both normal) are reassuring but if hypoxemic would get CT  Chest.    Return to Care: Return in about 3 months (around 03/15/2024).   Verdon Gore, MD Pulmonary and Critical Care Medicine [facility] Office:[phone]

## 2023-12-16 NOTE — Patient Instructions (Signed)
 Full PFT performed today.

## 2023-12-16 NOTE — Progress Notes (Signed)
 Full PFT performed today.

## 2023-12-17 LAB — RESPIRATORY ALLERGY PROFILE REGION II ~~LOC~~
Allergen, A. alternata, m6: <0.1 kU/L
Allergen, Cedar tree, t12: <0.1 kU/L
Allergen, Comm Silver Birch, t9: <0.1 kU/L
Allergen, Cottonwood, t14: <0.1 kU/L
Allergen, D pternoyssinus,d7: <0.1 kU/L
Allergen, Mouse Urine Protein, e78: 7.08 kU/L — ABNORMAL HIGH
Allergen, Mulberry, t76: <0.1 kU/L
Allergen, Oak,t7: <0.1 kU/L
Allergen, P. notatum, m1: <0.1 kU/L
Aspergillus fumigatus, m3: <0.1 kU/L
Bermuda Grass: <0.1 kU/L
Box Elder IgE: 0.25 kU/L — ABNORMAL HIGH
CLADOSPORIUM HERBARUM (M2) IGE: <0.1 kU/L
COMMON RAGWEED (SHORT) (W1) IGE: <0.1 kU/L
Cat Dander: 1.64 kU/L — ABNORMAL HIGH
Class: 0
Class: 0
Class: 0
Class: 0
Class: 0
Class: 0
Class: 0
Class: 0
Class: 0
Class: 0
Class: 0
Class: 0
Class: 0
Class: 0
Class: 0
Class: 0
Class: 0
Class: 0
Class: 0
Class: 1
Class: 2
Class: 3
Class: 3
Cockroach: <0.1 kU/L
D. farinae: <0.1 kU/L
Dog Dander: 6.94 kU/L — ABNORMAL HIGH
Elm IgE: <0.1 kU/L
IgE (Immunoglobulin E), Serum: 82 kU/L (ref <=114)
Johnson Grass: <0.1 kU/L
Pecan/Hickory Tree IgE: <0.1 kU/L
Rough Pigweed  IgE: <0.1 kU/L
Sheep Sorrel IgE: <0.1 kU/L
Timothy Grass: 0.44 kU/L — ABNORMAL HIGH

## 2023-12-17 LAB — CAT DANDER COMPONENT
E220-IgE Fel d 2: 0.18 kU/L — ABNORMAL HIGH (ref <0.10)
E228-IgE Fel d 4: 0.27 kU/L — ABNORMAL HIGH (ref <0.10)
Fel d 1 (e94) IgE: 1.16 kU/L — ABNORMAL HIGH (ref <0.10)
Fel d 7 (e231) IgE: 0.63 kU/L — ABNORMAL HIGH (ref <0.10)

## 2023-12-17 LAB — DOG DANDER COMPONENT
Can f 4(e229) IgE: 7.02 kU/L — ABNORMAL HIGH (ref <0.10)
Can f 6(e230) IgE: 0.11 kU/L — ABNORMAL HIGH (ref <0.10)
E101-IgE Can f 1: 0.25 kU/L — ABNORMAL HIGH (ref <0.10)
E102-IgE Can f 2: <0.1 kU/L (ref <0.10)
E221-IgE Can f 3: 1.88 kU/L — ABNORMAL HIGH (ref <0.10)
E226-IgE Can f 5: 0.35 kU/L — ABNORMAL HIGH (ref <0.10)

## 2023-12-17 LAB — INTERPRETATION:

## 2023-12-20 ENCOUNTER — Other Ambulatory Visit (HOSPITAL_COMMUNITY): Payer: Self-pay

## 2023-12-20 ENCOUNTER — Telehealth: Payer: Self-pay

## 2023-12-20 MED ORDER — FLUTICASONE-SALMETEROL 500-50 MCG/ACT IN AEPB: 1.0000 | INHALATION_SPRAY | Freq: Two times a day (BID) | RESPIRATORY_TRACT | 5 refills | Status: AC

## 2023-12-20 NOTE — Telephone Encounter (Signed)
 Called and spoke with pt letting her know the info about the prior auth and that Dr. Celine Mans switched her to Advair Diskus per prior auth response. Pt verbalized understanding. Nothing further needed.

## 2023-12-20 NOTE — Telephone Encounter (Signed)
 Sending to Dr. Celine Mans for her to be able to review the preferred inhaler with pt's insurance.

## 2023-12-20 NOTE — Telephone Encounter (Signed)
*  Pulm  Pharmacy Patient Advocate Encounter   Received notification from Fax that prior authorization for Advair  HFA 230-21MCG/ACT aerosol  is required/requested.   Insurance verification completed.   The patient is insured through [employer] .   Per test claim:  Advair  Diskus is preferred by the insurance.  If suggested medication is appropriate, Please send in a new RX and discontinue this one. If not, please advise as to why it's not appropriate so that we may request a Prior Authorization. Please note, some preferred medications may still require a PA   PA form attached in patients media file

## 2024-01-21 ENCOUNTER — Telehealth: Payer: Self-pay

## 2024-01-21 NOTE — Telephone Encounter (Signed)
Copied from CRM [id number]. Topic: Clinical - Medication Question >> Jan 21, 2024 12:52 PM Deaijah H wrote: Reason for CRM: New patient would like to know if her & her husband runs out of medication before appointment would they be able to get refills please call [phone]

## 2024-01-25 NOTE — Telephone Encounter (Signed)
 Patient is aware.  She does not need refills at this time.  She will call back as needed.

## 2024-02-01 ENCOUNTER — Encounter: Payer: Self-pay | Admitting: Internal Medicine

## 2024-02-17 ENCOUNTER — Emergency Department (HOSPITAL_BASED_OUTPATIENT_CLINIC_OR_DEPARTMENT_OTHER)
Admission: EM | Admit: 2024-02-17 | Discharge: 2024-02-17 | Disposition: A | Discharge status: Home / Self Care | Attending: Emergency Medicine | Admitting: Emergency Medicine

## 2024-02-17 ENCOUNTER — Other Ambulatory Visit: Payer: Self-pay

## 2024-02-17 ENCOUNTER — Encounter (HOSPITAL_BASED_OUTPATIENT_CLINIC_OR_DEPARTMENT_OTHER): Payer: Self-pay | Admitting: *Deleted

## 2024-02-17 DIAGNOSIS — L089 Local infection of the skin and subcutaneous tissue, unspecified: Secondary | ICD-10-CM | POA: Diagnosis not present

## 2024-02-17 DIAGNOSIS — R21 Rash and other nonspecific skin eruption: Secondary | ICD-10-CM | POA: Diagnosis present

## 2024-02-17 DIAGNOSIS — L989 Disorder of the skin and subcutaneous tissue, unspecified: Secondary | ICD-10-CM | POA: Insufficient documentation

## 2024-02-17 MED ORDER — DOXYCYCLINE HYCLATE 100 MG PO CAPS: 100.0000 mg | ORAL_CAPSULE | Freq: Two times a day (BID) | ORAL | 0 refills | Status: DC

## 2024-02-17 MED ORDER — DOXYCYCLINE HYCLATE 100 MG PO TABS
100.0000 mg | ORAL_TABLET | Freq: Once | ORAL | Status: AC
  Administered 2024-02-17: 100 mg via ORAL
  Filled 2024-02-17: qty 1

## 2024-02-17 NOTE — ED Triage Notes (Signed)
 Pt is here for evaluation of area on her right breast which has has been increasing in size for the past 2-3 days, some purulent discharge and surrounding cellulitis.  Pt has two additional areas on her trunk, one below right breast and one between breasts also.

## 2024-02-17 NOTE — ED Provider Notes (Signed)
 [place] [facility]MENT AT [facility] Provider Note   CSN: [id number] Arrival date & time: 02/17/24  2013     History  Chief Complaint  Patient presents with   Recurrent Skin Infections    Sonya Peterson is a 67 y.o. female.  The history is provided by the patient.  Rash Location: right breast. Quality: redness   Severity:  Moderate Onset quality:  Gradual Duration: days. Timing:  Constant Progression:  Worsening Chronicity:  New Context: not insect bite/sting   Relieved by:  Nothing Worsened by:  Nothing Ineffective treatments:  None tried Associated symptoms: no abdominal pain, no fever, no tongue swelling, no URI, not vomiting and not wheezing        Home Medications Prior to Admission medications   Medication Sig Start Date End Date Taking? Authorizing Provider  doxycycline (VIBRAMYCIN) 100 MG capsule Take 1 capsule (100 mg total) by mouth 2 (two) times daily. One po bid x 7 days 02/17/24  Yes Dorinda Stehr, MD  albuterol (VENTOLIN HFA) 108 (90 Base) MCG/ACT inhaler Inhale 2 puffs into the lungs every 6 (six) hours as needed for wheezing or shortness of breath. 08/27/22   Raynelle Dick, NP  azelastine (ASTELIN) 0.1 % nasal spray Place 1 spray into both nostrils 2 (two) times daily. Use in each nostril as directed 12/16/23   Charlott Holler, MD  Cholecalciferol (VITAMIN D3) 5000 units TABS Take 5,000 Units by mouth at bedtime.    [provider]  FLUoxetine (PROZAC) 20 MG capsule Take 3 capsules (60 mg total) by mouth daily. Take 2 capsules Daily for Mood 10/26/23   Raynelle Dick, NP  fluticasone-salmeterol (ADVAIR DISKUS) 500-50 MCG/ACT AEPB Inhale 1 puff into the lungs in the morning and at bedtime. 12/20/23   Charlott Holler, MD  loratadine (CLARITIN) 10 MG tablet Take 1 tablet (10 mg total) by mouth daily. 02/18/23 02/18/24  Raynelle Dick, NP  montelukast (SINGULAIR) 10 MG tablet TAKE 1 TABLET DAILY FOR ALLERGIES 07/05/23    Raynelle Dick, NP  simvastatin (ZOCOR) 40 MG tablet TAKE 1 TABLET AT BEDTIME FOR CHOLESTEROL 04/26/23   Raynelle Dick, NP  telmisartan (MICARDIS) 80 MG tablet TAKE 1 TABLET DAILY FOR BLOOD PRESSURE Patient taking differently: Take 40 mg by mouth daily. TAKE 0.5 TABLET DAILY FOR BLOOD PRESSURE 09/26/21   Judd Gaudier, NP      Allergies    Patient has no known allergies.    Review of Systems   Review of Systems  Constitutional:  Negative for fever.  HENT:  Negative for facial swelling.   Eyes:  Negative for redness.  Respiratory:  Negative for wheezing.   Gastrointestinal:  Negative for abdominal pain and vomiting.  Skin:  Positive for rash.  All other systems reviewed and are negative.   Physical Exam Updated Vital Signs BP 110/62   Pulse 62   Temp 98 F (36.7 C) (Oral)   Resp 18   SpO2 94%  Physical Exam Vitals and nursing note reviewed.  Constitutional:      General: She is not in acute distress.    Appearance: Normal appearance. She is well-developed.  HENT:     Head: Normocephalic and atraumatic.     Nose: Nose normal.  Eyes:     Pupils: Pupils are equal, round, and reactive to light.  Cardiovascular:     Rate and Rhythm: Normal rate and regular rhythm.     Pulses: Normal pulses.  Heart sounds: Normal heart sounds.  Pulmonary:     Effort: Pulmonary effort is normal. No respiratory distress.     Breath sounds: Normal breath sounds.  Chest:    Abdominal:     General: Bowel sounds are normal. There is no distension.     Palpations: Abdomen is soft.     Tenderness: There is no abdominal tenderness. There is no guarding or rebound.  Musculoskeletal:        General: Normal range of motion.     Cervical back: Normal range of motion and neck supple.  Skin:    General: Skin is warm and dry.     Capillary Refill: Capillary refill takes less than 2 seconds.     Findings: No erythema or rash.  Neurological:     General: No focal deficit present.      Mental Status: She is alert and oriented to person, place, and time.     Deep Tendon Reflexes: Reflexes normal.  Psychiatric:        Mood and Affect: Mood normal.        Behavior: Behavior normal.     ED Results / Procedures / Treatments   Labs (all labs ordered are listed, but only abnormal results are displayed) Labs Reviewed - No data to display  EKG None  Radiology No results found.  Procedures Procedures    Medications Ordered in ED Medications  doxycycline (VIBRA-TABS) tablet 100 mg (has no administration in time range)    ED Course/ Medical Decision Making/ A&P                                 Medical Decision Making Patient with several days of right breast redness   Amount and/or Complexity of Data Reviewed External Data Reviewed: notes.    Details: Previous notes reviewed   Risk Prescription drug management. Risk Details: Cellulitis, no fluctuance at this time.  Will need follow up with PMD and breast center for ongoing imaging and care. Warm compresses and antibiotics.  Stable for discharge.      Final Clinical Impression(s) / ED Diagnoses Final diagnoses:  Skin lesion  Skin infection   No signs of systemic illness or infection. The patient is nontoxic-appearing on exam and vital signs are within normal limits.  I have reviewed the triage vital signs and the nursing notes. Pertinent labs & imaging results that were available during my care of the patient were reviewed by me and considered in my medical decision making (see chart for details). After history, exam, and medical workup I feel the patient has been appropriately medically screened and is safe for discharge home. Pertinent diagnoses were discussed with the patient. Patient was given return precautions.  Rx / DC Orders ED Discharge Orders          Ordered    doxycycline (VIBRAMYCIN) 100 MG capsule  2 times daily        02/17/24 2328              Dimonique Bourdeau, MD 02/17/24 2342

## 2024-02-18 ENCOUNTER — Other Ambulatory Visit: Payer: Self-pay | Admitting: General Surgery

## 2024-02-18 DIAGNOSIS — N649 Disorder of breast, unspecified: Secondary | ICD-10-CM

## 2024-02-21 ENCOUNTER — Encounter: Payer: PRIVATE HEALTH INSURANCE | Admitting: Nurse Practitioner

## 2024-02-25 ENCOUNTER — Other Ambulatory Visit: Payer: Self-pay

## 2024-02-25 DIAGNOSIS — T7840XD Allergy, unspecified, subsequent encounter: Secondary | ICD-10-CM

## 2024-02-25 MED ORDER — LORATADINE 10 MG PO TABS
10.0000 mg | ORAL_TABLET | Freq: Every day | ORAL | 0 refills | Status: DC
Start: 2024-02-25 — End: 2024-06-08

## 2024-03-09 ENCOUNTER — Encounter

## 2024-03-09 ENCOUNTER — Other Ambulatory Visit

## 2024-03-15 ENCOUNTER — Ambulatory Visit: Payer: TRICARE For Life (TFL) | Admitting: Internal Medicine

## 2024-03-15 ENCOUNTER — Encounter: Payer: Self-pay | Admitting: Internal Medicine

## 2024-03-15 VITALS — BP 134/80 | HR 72 | Ht 66.0 in | Wt 199.2 lb

## 2024-03-15 DIAGNOSIS — J4540 Moderate persistent asthma, uncomplicated: Secondary | ICD-10-CM

## 2024-03-15 DIAGNOSIS — D7219 Other eosinophilia: Secondary | ICD-10-CM

## 2024-03-15 DIAGNOSIS — J309 Allergic rhinitis, unspecified: Secondary | ICD-10-CM

## 2024-03-15 NOTE — Patient Instructions (Signed)
 It was a pleasure to see you today!  Please schedule follow up with myself in 1 year .  If my schedule is not open yet, we will contact you with a reminder closer to that time. Please call [phone] if you haven't heard from [place] a month before, and always call us sooner if issues or concerns arise. You can also send [place] a message through MyChart, but but aware that this is not to be used for urgent issues and it may take up to 5-7 days to receive a reply. Please be aware that you will likely be able to view your results before I have a chance to respond to them. Please give [place] 5 business days to respond to any non-urgent results.   Continue cetirizine and sinulair with astelin. Continue advair 230 2 puffs twice daily, gargle after use. You are up to date on pneumonia and RSV vaccine.  Need annual flu vaccine in the fall. Oxygen levels look great today.

## 2024-03-15 NOTE — Progress Notes (Signed)
 Sonya Peterson    [id number]    December 12, 1955  Primary Care Physician:McKeown, Chrissie Noa, MD Date of Appointment: 03/15/2024 Established Patient Visit  Chief complaint:   Chief Complaint  Patient presents with   Follow-up    Patient states she is doing well     HPI: Sonya Peterson is a 68 y.o. woman with moderate persistent asthma.   Interval Updates: Here for follow up after switching to high dose advair  No prednisone courses  Zero albuterol use since switching inhaler  Nasal congestion improved on astelin with below regimen  Current Regimen: advair 230 2 puffs twice daily, albuterol prn Asthma Triggers: stress, cold, heat, exertion Exacerbations in the last year: none requiring prednisone.  History of hospitalization or intubation: never Allergy Testing/rhinitis: yes on claritin and singulair and astelin. Cat and dog sensitivity GERD: denies ACT:  Asthma Control Test ACT Total Score  03/15/2024 10:00 AM 25  12/16/2023 10:10 AM 13  12/18/2022  2:23 PM 22   FeNO: obtained 12/16/23 18 ppb  Serum Eos/IgE: eos 500-700  I have reviewed the patient's family social and past medical history and updated as appropriate.   Past Medical History:  Diagnosis Date   Allergy    Anxiety    Arthritis    Asthma    Bronchitis    Depression    OCD   History of hepatitis B    History of kidney stones    Hyperlipidemia    Hypertension    Obesity (BMI 30.0-34.9)    Prediabetes 11/09/2013   Vitamin D deficiency     Past Surgical History:  Procedure Laterality Date   ACHILLES TENDON SURGERY     BACK SURGERY     BREAST SURGERY Left    lump removed left   DILATION AND CURETTAGE OF UTERUS     EXTRACORPOREAL SHOCK WAVE LITHOTRIPSY Left 10/23/2019   Procedure: EXTRACORPOREAL SHOCK WAVE LITHOTRIPSY (ESWL);  Surgeon: Jerilee Field, MD;  Location: [facility] ORS;  Service: Urology;  Laterality: Left;   HEEL SPUR SURGERY Left 2015   I & D EXTREMITY Right 08/06/2017    Procedure: IRRIGATION AND DEBRIDEMENT RIGHT ACHILLES;  Surgeon: Nadara Mustard, MD;  Location: [facility] OR;  Service: Orthopedics;  Laterality: Right;   LUMBAR DISC SURGERY  2004   TUBAL LIGATION      Family History  Problem Relation Age of Onset   Cancer Mother 66       breast   Hypertension Mother    Diabetes Mother    Heart disease Mother    Cancer Father 32       prostate   Heart disease Maternal Grandfather        heart failure   Colon cancer Paternal Grandmother    Crohn's disease Daughter     Social History   Occupational History   Not on file  Tobacco Use   Smoking status: Never    Passive exposure: Past   Smokeless tobacco: Never  Vaping Use   Vaping status: Never Used  Substance and Sexual Activity   Alcohol use: No    Comment: ocassionaly   Drug use: No   Sexual activity: Not on file     Physical Exam: Blood pressure 134/80, pulse 72, height 5\' 6"  (1.676 m), weight 199 lb 3.2 oz (90.4 kg), SpO2 94%.  Gen:     No distress ENT:  mmm no thrush Lungs:   ctab no wheezes or crackles CV:  RRR no mrg   Data Reviewed: Imaging: I have personally reviewed the   PFTs:     Latest Ref Rng & Units 12/16/2023    8:41 AM  PFT Results  FVC-Pre L 1.32   FVC-Predicted Pre % 40   FVC-Post L 1.96   FVC-Predicted Post % 60   Pre FEV1/FVC % % 68   Post FEV1/FCV % % 71   FEV1-Pre L 0.89   FEV1-Predicted Pre % 36   FEV1-Post L 1.39   DLCO uncorrected ml/min/mmHg 19.79   DLCO UNC% % 98   DLCO corrected ml/min/mmHg 19.79   DLCO COR %Predicted % 98   DLVA Predicted % 126   TLC L 5.60   TLC % Predicted % 109   RV % Predicted % 176    I have personally reviewed the patient's PFTs and normal diffusion capacity and lung volumes. Spiromtery not interpretable due to early glottic closure in the first second of exhalation.   Labs: Lab Results  Component Value Date   WBC 9.8 08/23/2023   HGB 14.9 08/23/2023   HCT 44.5 08/23/2023   MCV 88.1 08/23/2023   PLT 324  08/23/2023   Lab Results  Component Value Date   NA 138 08/23/2023   K 4.3 08/23/2023   CL 102 08/23/2023   CO2 27 08/23/2023    Immunization status: Immunization History  Administered Date(s) Administered   Influenza Inj Mdck Quad With Preservative 08/16/2017, 09/20/2018, 09/13/2019, 09/18/2020   Influenza Split 08/31/2013, 10/04/2014, 10/09/2015   Influenza, High Dose Seasonal PF 08/23/2023   Influenza, Seasonal, Injecte, Preservative Fre 09/21/2016   Influenza,inj,Quad PF,6+ Mos 09/16/2021, 09/17/2022   PFIZER Comirnaty(Gray Top)Covid-19 Tri-Sucrose Vaccine 01/12/2023   PFIZER(Purple Top)SARS-COV-2 Vaccination 02/17/2020, 03/12/2020, 10/28/2020   RSV IGIV 09/17/2022   Tdap 03/28/2015    External Records Personally Reviewed: pulmonary  Assessment:  Moderate persistent asthma controlled Peripheral Eosinophilia - AEC 500-700 in the last few years chronic allergic rhinitis controlled Desaturation without hypoxemia  Plan/Recommendations:   Continue cetirizine and sinulair with astelin. Continue advair 230 2 puffs twice daily, gargle after use. You are up to date on pneumonia and RSV vaccine.  Need annual flu vaccine in the fall. Oxygen levels look great today.   Return to Care: Return in about 1 year (around 03/15/2025).   Durel Salts, MD Pulmonary and Critical Care Medicine [facility] Office:[phone]

## 2024-04-05 ENCOUNTER — Telehealth: Payer: Self-pay | Admitting: Internal Medicine

## 2024-04-05 NOTE — Telephone Encounter (Signed)
 Copied from CRM [id number]. Topic: General - Call Back - No Documentation >> Apr 05, 2024  1:47 PM Sonya Peterson wrote: Reason for CRM: Patient states she's returning a call to [name], no notes or messages please reach out to patient, thanks.  Sonya Peterson [phone] Pt does not belong to us , no phone messages to that affect.

## 2024-04-07 ENCOUNTER — Ambulatory Visit
Admission: RE | Admit: 2024-04-07 | Discharge: 2024-04-07 | Disposition: A | Discharge status: Home / Self Care | Source: Ambulatory Visit | Attending: General Surgery | Admitting: General Surgery

## 2024-04-07 DIAGNOSIS — N649 Disorder of breast, unspecified: Secondary | ICD-10-CM

## 2024-04-10 ENCOUNTER — Ambulatory Visit: Payer: TRICARE For Life (TFL) | Admitting: Internal Medicine

## 2024-06-08 ENCOUNTER — Encounter: Payer: Self-pay | Admitting: Family Medicine

## 2024-06-08 ENCOUNTER — Ambulatory Visit: Admitting: Family Medicine

## 2024-06-08 VITALS — BP 144/86 | HR 70 | Temp 98.2°F | Ht 66.0 in | Wt 203.0 lb

## 2024-06-08 DIAGNOSIS — I10 Essential (primary) hypertension: Secondary | ICD-10-CM | POA: Diagnosis not present

## 2024-06-08 DIAGNOSIS — T7840XA Allergy, unspecified, initial encounter: Secondary | ICD-10-CM | POA: Diagnosis not present

## 2024-06-08 DIAGNOSIS — E66811 Obesity, class 1: Secondary | ICD-10-CM

## 2024-06-08 DIAGNOSIS — E559 Vitamin D deficiency, unspecified: Secondary | ICD-10-CM

## 2024-06-08 DIAGNOSIS — F419 Anxiety disorder, unspecified: Secondary | ICD-10-CM

## 2024-06-08 DIAGNOSIS — R7303 Prediabetes: Secondary | ICD-10-CM | POA: Diagnosis not present

## 2024-06-08 DIAGNOSIS — Z7689 Persons encountering health services in other specified circumstances: Secondary | ICD-10-CM

## 2024-06-08 DIAGNOSIS — J45909 Unspecified asthma, uncomplicated: Secondary | ICD-10-CM | POA: Diagnosis not present

## 2024-06-08 DIAGNOSIS — E6609 Other obesity due to excess calories: Secondary | ICD-10-CM | POA: Diagnosis not present

## 2024-06-08 DIAGNOSIS — E785 Hyperlipidemia, unspecified: Secondary | ICD-10-CM

## 2024-06-08 DIAGNOSIS — F3341 Major depressive disorder, recurrent, in partial remission: Secondary | ICD-10-CM

## 2024-06-08 DIAGNOSIS — Z6832 Body mass index (BMI) 32.0-32.9, adult: Secondary | ICD-10-CM

## 2024-06-08 LAB — COMPREHENSIVE METABOLIC PANEL WITH GFR
ALT: 10 U/L (ref 0–35)
AST: 13 U/L (ref 0–37)
Albumin: 4.6 g/dL (ref 3.5–5.2)
Alkaline Phosphatase: 83 U/L (ref 39–117)
BUN: 14 mg/dL (ref 6–23)
CO2: 28 meq/L (ref 19–32)
Calcium: 9.4 mg/dL (ref 8.4–10.5)
Chloride: 103 meq/L (ref 96–112)
Creatinine, Ser: 0.72 mg/dL (ref 0.40–1.20)
GFR: 87.56 mL/min (ref >60.00)
Glucose, Bld: 111 mg/dL — ABNORMAL HIGH (ref 70–99)
Potassium: 4.2 meq/L (ref 3.5–5.1)
Sodium: 139 meq/L (ref 135–145)
Total Bilirubin: 0.3 mg/dL (ref 0.2–1.2)
Total Protein: 8.1 g/dL (ref 6.0–8.3)

## 2024-06-08 LAB — LIPID PANEL
Cholesterol: 140 mg/dL (ref 0–200)
HDL: 42.2 mg/dL (ref >39.00)
LDL Cholesterol: 76 mg/dL (ref 0–99)
NonHDL: 97.42
Total CHOL/HDL Ratio: 3
Triglycerides: 107 mg/dL (ref 0.0–149.0)
VLDL: 21.4 mg/dL (ref 0.0–40.0)

## 2024-06-08 LAB — CBC WITH DIFFERENTIAL/PLATELET
Basophils Absolute: 0.1 10*3/uL (ref 0.0–0.1)
Basophils Relative: 1.2 % (ref 0.0–3.0)
Eosinophils Absolute: 1 10*3/uL — ABNORMAL HIGH (ref 0.0–0.7)
Eosinophils Relative: 9.4 % — ABNORMAL HIGH (ref 0.0–5.0)
HCT: 44.7 % (ref 36.0–46.0)
Hemoglobin: 14.7 g/dL (ref 12.0–15.0)
Lymphocytes Relative: 23 % (ref 12.0–46.0)
Lymphs Abs: 2.4 10*3/uL (ref 0.7–4.0)
MCHC: 32.9 g/dL (ref 30.0–36.0)
MCV: 90.7 fl (ref 78.0–100.0)
Monocytes Absolute: 0.7 10*3/uL (ref 0.1–1.0)
Monocytes Relative: 6.4 % (ref 3.0–12.0)
Neutro Abs: 6.2 10*3/uL (ref 1.4–7.7)
Neutrophils Relative %: 60 % (ref 43.0–77.0)
Platelets: 328 10*3/uL (ref 150.0–400.0)
RBC: 4.93 Mil/uL (ref 3.87–5.11)
RDW: 13.6 % (ref 11.5–15.5)
WBC: 10.4 10*3/uL (ref 4.0–10.5)

## 2024-06-08 LAB — VITAMIN D 25 HYDROXY (VIT D DEFICIENCY, FRACTURES): VITD: 68.19 ng/mL (ref 30.00–100.00)

## 2024-06-08 LAB — HEMOGLOBIN A1C: Hgb A1c MFr Bld: 6 % (ref 4.6–6.5)

## 2024-06-08 MED ORDER — CETIRIZINE HCL 10 MG PO TABS
10.0000 mg | ORAL_TABLET | Freq: Every day | ORAL | 1 refills | Status: AC
Start: 2024-06-08 — End: 2024-12-05

## 2024-06-08 MED ORDER — TELMISARTAN 40 MG PO TABS
40.0000 mg | ORAL_TABLET | Freq: Every day | ORAL | Status: DC
Start: 2024-06-08 — End: 2024-09-15

## 2024-06-08 NOTE — Assessment & Plan Note (Signed)
 Stable. Patient scored 0 on PHQ-9 and 3 on GAD-7. Patient is taking Fluoxetine  20mg  tablet, 2 tablets daily but used to take 3 tablets daily. However, there is concern with taking 60mg  being over the age of 68 versus 40mg  daily from her previous primary care provider. After visit, did some research and consulted Dr. CHARLENA Nap about the concern. Based on information, it would be best to go back up to 60mg  if anxiety and OCD traits are not managed. Benefit of medication out weights the risk of being older than 66 years old.

## 2024-06-08 NOTE — Assessment & Plan Note (Addendum)
 Ordered vitamin D  level. Taking a supplement.

## 2024-06-08 NOTE — Progress Notes (Signed)
 New Patient Office Visit  Subjective   Patient ID: Sonya Peterson, female    DOB: 06/24/1956  Age: 68 y.o. MRN: [id number]  CC:  Chief Complaint  Patient presents with   Establish Care    Medication refill     HPI [name] presents to establish care with new provider.   Patients previous primary care: [facility] with Dr. Elsie Richards.   Specialist: [facility] Pulmonary care with Dr. Verdon Gore  Asthma: Chronic. Patient is prescribed Albuterol  Inhaler, 2 spray every 6 hours PRN. Does not use often. Also, takes Montelukast  10mg  daily.   Anxiety, depression, and OCD: Chronic. Patient is prescribed Fluoxetine  20mg  tablet, prescribed  3 tablets a day, but was told to only take 2 tablets a day based on her age. She has been taking 2 tablets for a little less than a year ago. But, reports she notices that she picks her skin or rub more. When taking 60mg  daily it was stable. She has been taking medication for years. Based on chart review, patient was asked to decrease medication on 10/25/2023 due to being geriatric.   Allergies: Chronic. Patient is prescribed Loratadine  10mg  daily. However, she typically rotates every 6 months between Loratadine  and Zyrtec . This has rotation has been beneficial.   Hyperlipidemia: Chronic. Patient is prescribed Simvastatin  10mg  daily.  Lab Results  Component Value Date   CHOL 145 08/23/2023   HDL 36 (L) 08/23/2023   LDLCALC 85 08/23/2023   TRIG 141 08/23/2023   CHOLHDL 4.0 08/23/2023    HTN: Chronic. Patient is prescribed Telmisartan  80mg  tablet, 1/2 tablet daily. Does not monitor her blood pressure. Denies CP, SHOB, HA, dizziness, lightheadedness, or lower extremity edema.  BP Readings from Last 3 Encounters:  06/08/24 (!) 144/86  03/15/24 134/80  02/17/24 115/71    Outpatient Encounter Medications as of 06/08/2024  Medication Sig   albuterol  (VENTOLIN  HFA) 108 (90 Base) MCG/ACT inhaler  Inhale 2 puffs into the lungs every 6 (six) hours as needed for wheezing or shortness of breath.   azelastine  (ASTELIN ) 0.1 % nasal spray Place 1 spray into both nostrils 2 (two) times daily. Use in each nostril as directed   cetirizine  (ZYRTEC  ALLERGY ) 10 MG tablet Take 1 tablet (10 mg total) by mouth at bedtime.   Cholecalciferol (VITAMIN D3) 5000 units TABS Take 5,000 Units by mouth at bedtime.   FLUoxetine  (PROZAC ) 20 MG capsule Take 3 capsules (60 mg total) by mouth daily. Take 2 capsules Daily for Mood (Patient taking differently: Take 40 mg by mouth daily. Take 2 capsules Daily for Mood 40 mg)   fluticasone -salmeterol (ADVAIR  DISKUS) 500-50 MCG/ACT AEPB Inhale 1 puff into the lungs in the morning and at bedtime.   montelukast  (SINGULAIR ) 10 MG tablet TAKE 1 TABLET DAILY FOR ALLERGIES   simvastatin  (ZOCOR ) 40 MG tablet TAKE 1 TABLET AT BEDTIME FOR CHOLESTEROL   telmisartan  (MICARDIS ) 40 MG tablet Take 1 tablet (40 mg total) by mouth daily.   [DISCONTINUED] loratadine  (CLARITIN ) 10 MG tablet Take 1 tablet (10 mg total) by mouth daily.   [DISCONTINUED] telmisartan  (MICARDIS ) 80 MG tablet TAKE 1 TABLET DAILY FOR BLOOD PRESSURE (Patient taking differently: Take 40 mg by mouth daily. Take 40 mg daily)   [DISCONTINUED] 0.9 %  sodium chloride  infusion    No facility-administered encounter medications on file as of 06/08/2024.    Past Medical History:  Diagnosis Date   Allergy     Anxiety  Arthritis    Asthma    Bronchitis    Depression    OCD   History of hepatitis B    History of kidney stones    Hyperlipidemia    Hypertension    Obesity (BMI 30.0-34.9)    OCD (obsessive compulsive disorder)    Prediabetes 11/09/2013   Vitamin D  deficiency     Past Surgical History:  Procedure Laterality Date   ACHILLES TENDON SURGERY     BACK SURGERY     BREAST SURGERY Left    lump removed left   DILATION AND CURETTAGE OF UTERUS     EXTRACORPOREAL SHOCK WAVE LITHOTRIPSY Left 10/23/2019    Procedure: EXTRACORPOREAL SHOCK WAVE LITHOTRIPSY (ESWL);  Surgeon: Nieves Cough, MD;  Location: [facility];  Service: Urology;  Laterality: Left;   HEEL SPUR SURGERY Left 2015   I & D EXTREMITY Right 08/06/2017   Procedure: IRRIGATION AND DEBRIDEMENT RIGHT ACHILLES;  Surgeon: Harden Jerona GAILS, MD;  Location: [facility] OR;  Service: Orthopedics;  Laterality: Right;   LUMBAR DISC SURGERY  2004   TUBAL LIGATION      Family History  Problem Relation Age of Onset   Cancer Mother 10       breast   Hypertension Mother    Diabetes Mother    Heart disease Mother    Cancer Father 42       prostate   Arthritis Father    Heart disease Maternal Grandfather        heart failure   Colon cancer Paternal Grandmother    Crohn's disease Daughter    Anxiety disorder Daughter    Asthma Brother     Social History   Socioeconomic History   Marital status: Married    Spouse name: Not on file   Number of children: 1   Years of education: Not on file   Highest education level: Some college, no degree  Occupational History   Not on file  Tobacco Use   Smoking status: Never    Passive exposure: Past   Smokeless tobacco: Never  Vaping Use   Vaping status: Never Used  Substance and Sexual Activity   Alcohol use: Not Currently   Drug use: No   Sexual activity: Not Currently    Birth control/protection: Post-menopausal, Surgical  Other Topics Concern   Not on file  Social History Narrative   Not on file   Social Drivers of Health   Financial Resource Strain: Low Risk  (06/01/2024)   Overall Financial Resource Strain (CARDIA)    Difficulty of Paying Living Expenses: Not hard at all  Food Insecurity: No Food Insecurity (06/01/2024)   Hunger Vital Sign    Worried About Running Out of Food in the Last Year: Never true    Ran Out of Food in the Last Year: Never true  Transportation Needs: No Transportation Needs (06/01/2024)   PRAPARE - Administrator, Civil Service (Medical): No    Lack of  Transportation (Non-Medical): No  Physical Activity: Inactive (06/08/2024)   Exercise Vital Sign    Days of Exercise per Week: 0 days    Minutes of Exercise per Session: 0 min  Stress: No Stress Concern Present (06/01/2024)   [employer] of Occupational Health - Occupational Stress Questionnaire    Feeling of Stress: Not at all  Social Connections: Socially Integrated (06/08/2024)   Social Connection and Isolation Panel    Frequency of Communication with Friends and Family: More than three times a  week    Frequency of Social Gatherings with Friends and Family: Once a week    Attends Religious Services: More than 4 times per year    Active Member of [employer] or Organizations: Yes    Attends Engineer, structural: More than 4 times per year    Marital Status: Married  Catering manager Violence: Not At Risk (06/08/2024)   Humiliation, Afraid, Rape, and Kick questionnaire    Fear of Current or Ex-Partner: No    Emotionally Abused: No    Physically Abused: No    Sexually Abused: No    ROS See HPI above    Objective  BP (!) 144/86   Pulse 70   Temp 98.2 F (36.8 C) (Oral)   Ht 5' 6 (1.676 m)   Wt 203 lb (92.1 kg)   SpO2 94%   BMI 32.77 kg/m   Physical Exam Vitals reviewed.  Constitutional:      General: She is not in acute distress.    Appearance: Normal appearance. She is obese. She is not ill-appearing, toxic-appearing or diaphoretic.  HENT:     Head: Normocephalic and atraumatic.  Eyes:     General:        Right eye: No discharge.        Left eye: No discharge.     Conjunctiva/sclera: Conjunctivae normal.  Cardiovascular:     Rate and Rhythm: Normal rate and regular rhythm.     Heart sounds: Normal heart sounds. No murmur heard.    No friction rub. No gallop.  Pulmonary:     Effort: Pulmonary effort is normal. No respiratory distress.     Breath sounds: Normal breath sounds.  Musculoskeletal:        General: Normal range of motion.  Skin:    General: Skin  is warm and dry.  Neurological:     General: No focal deficit present.     Mental Status: She is alert and oriented to person, place, and time. Mental status is at baseline.  Psychiatric:        Mood and Affect: Mood normal.        Behavior: Behavior normal.        Thought Content: Thought content normal.        Judgment: Judgment normal.      Assessment & Plan:  Primary hypertension Assessment & Plan: Blood pressure is slightly elevated, could be from meeting a new provider. Recommend to continue Telmisartan  1/2 tablet of 80mg  (40mg ) daily. Did change the prescription to a 40mg  tablet instead of her having to cut the pill in half. She is going to notify when she needs a refill. Recommend to monitor her blood pressure, it continues to be elevated above 140/90, needs to follow up sooner. Ordered CBC and CMP.   Orders: -     Telmisartan ; Take 1 tablet (40 mg total) by mouth daily. -     CBC with Differential/Platelet -     Comprehensive metabolic panel with GFR  Allergy , initial encounter -     Cetirizine  HCl; Take 1 tablet (10 mg total) by mouth at bedtime.  Dispense: 90 tablet; Refill: 1  Uncomplicated asthma, unspecified asthma severity, unspecified whether persistent Assessment & Plan: Stable. Continue Albuterol  Inhaler PRN and Montelukast  10mg  daily.     Hyperlipidemia, unspecified hyperlipidemia type Assessment & Plan: Stable. Continue Simvastatin  10mg  daily. Ordered lipid panel and CMP.   Orders: -     Comprehensive metabolic panel with GFR -  Lipid panel  Vitamin D  deficiency Assessment & Plan: Ordered vitamin D  level. Taking a supplement.   Orders: -     VITAMIN D  25 Hydroxy (Vit-D Deficiency, Fractures)  Anxiety Assessment & Plan: Stable. Patient scored 0 on PHQ-9 and 3 on GAD-7. Patient is taking Fluoxetine  20mg  tablet, 2 tablets daily but used to take 3 tablets daily. However, there is concern with taking 60mg  being over the age of 19 versus 40mg  daily  from her previous primary care provider. After visit, did some research and consulted Dr. CHARLENA Nap about the concern. Based on information, it would be best to go back up to 60mg  if anxiety and OCD traits are not managed. Benefit of medication out weights the risk of being older than 66 years old.     Recurrent major depressive disorder, in partial remission (HCC) Assessment & Plan: Stable. Patient scored 0 on PHQ-9 and 3 on GAD-7. Patient is taking Fluoxetine  20mg  tablet, 2 tablets daily but used to take 3 tablets daily. However, there is concern with taking 60mg  being over the age of 55 versus 40mg  daily from her previous primary care provider. After visit, did some research and consulted Dr. CHARLENA Nap about the concern. Based on information, it would be best to go back up to 60mg  if anxiety and OCD traits are not managed. Benefit of medication out weights the risk of being older than 66 years old.     Prediabetes -     CBC with Differential/Platelet -     Comprehensive metabolic panel with GFR -     Hemoglobin A1c  Class 1 obesity due to excess calories without serious comorbidity with body mass index (BMI) of 32.0 to 32.9 in adult -     CBC with Differential/Platelet  Encounter to establish care   1.Review health maintenance: -Cervical cancer screening: Can do this at next appointment.  -Covid booster: Fall 2024 -Dexa scan: Declines  -AWV: Need visit  -PNA vaccine: [facility] on [address]  -Zoster vaccine: [facility] on [address]  2. Ordered labs for prediabetes and BMI-obesity.  Return in about 3 months (around 09/08/2024) for chronic management; AWV visit with Dr. Luke .   Ilma Achee, NP

## 2024-06-08 NOTE — Patient Instructions (Addendum)
-  It was nice to meet you and look forward to taking care of you.  -Continue Telmisartan  40mg  daily. Did change prescription to a 40mg  tablet to a 80mg  tablet. When a refill is needed, please call the office or send a MyChart when new prescription is needed for the 40mg  tablet to be sent instead of the 80mg  tablet. Blood pressure was elevated today. Recommend to monitor blood pressure. Follow up sooner if it consistently stays above 140/90.  -As for Fluoxetine , medication can be increased back up to 60 mg daily. After visit, did some research and spoke with another provider. The benefit of the medication is greater than the risk of taking after the age of 68 years old.  -Continue all other medications.  -Ordered labs. Office will call with lab results and will be available on MyChart.  -Follow up in 3 months.

## 2024-06-08 NOTE — Assessment & Plan Note (Signed)
 Stable. Patient scored 0 on PHQ-9 and 3 on GAD-7. Patient is taking Fluoxetine  20mg  tablet, 2 tablets daily but used to take 3 tablets daily. However, there is concern with taking 60mg  being over the age of 5 versus 40mg  daily from her previous primary care provider. After visit, did some research and consulted Dr. CHARLENA Nap about the concern. Based on information, it would be best to go back up to 60mg  if anxiety and OCD traits are not managed. Benefit of medication out weights the risk of being older than 66 years old.

## 2024-06-08 NOTE — Assessment & Plan Note (Signed)
 Blood pressure is slightly elevated, could be from meeting a new provider. Recommend to continue Telmisartan  1/2 tablet of 80mg  (40mg ) daily. Did change the prescription to a 40mg  tablet instead of her having to cut the pill in half. She is going to notify when she needs a refill. Recommend to monitor her blood pressure, it continues to be elevated above 140/90, needs to follow up sooner. Ordered CBC and CMP.

## 2024-06-08 NOTE — Assessment & Plan Note (Signed)
 Stable. Continue Simvastatin  10mg  daily. Ordered lipid panel and CMP.

## 2024-06-08 NOTE — Assessment & Plan Note (Signed)
 Stable. Continue Albuterol  Inhaler PRN and Montelukast  10mg  daily.

## 2024-06-12 ENCOUNTER — Ambulatory Visit: Payer: Self-pay | Admitting: Family Medicine

## 2024-07-03 ENCOUNTER — Ambulatory Visit: Payer: Self-pay

## 2024-07-03 NOTE — Telephone Encounter (Signed)
 FYI Only or Action Required?: FYI only for provider.  Patient was last seen in primary care on 06/08/2024 by Billy Philippe SAUNDERS, NP.  Called Nurse Triage reporting Mass, redness, swelling to area, and armpit pain.  Symptoms began a week ago.  Interventions attempted: Rest, hydration, or home remedies.  Symptoms are: gradually worsening.  Triage Disposition: See Physician Within 24 Hours  Patient/caregiver understands and will follow disposition?: Yes     Copied from CRM #[id number]. Topic: Clinical - Red Word Triage >> Jul 03, 2024 10:16 AM Larissa RAMAN wrote: [facility] that prompted transfer to Nurse Triage: bumps/cyst- armspits and breast. Redness, pain, swelling Reason for Disposition  [1] Swelling is painful to touch AND [2] no fever  Answer Assessment - Initial Assessment Questions 1. SYMPTOM: What's the main symptom you're concerned about?  (e.g., lump, nipple discharge, pain, rash)     1 lump on other side then 3 left side 2. LOCATION: Where is the lumps located?     Right below the armpit 3. ONSET: When did symptoms  start?     A week ago 4. PRIOR HISTORY: Do you have any history of prior problems with your breasts? (e.g., breast cancer, breast implant, fibrocystic breast disease)     no 5. CAUSE: What do you think is causing this symptom?     This is almost like acne or something 6. OTHER SYMPTOMS: Do you have any other symptoms? (e.g., breast pain, fever, nipple discharge, redness or rash)     Put arm down sore, all of sudden they're there, looks like cystic acne Redness to lump and area around it Swelling to the area as well, like bump gonna come up and gonna be a whitehead No breast pain, fever, breast dimpling, or nipple discharge    Advised pt be examined in next 24 hours, scheduled with PCP office for earliest appt with female provider, tomorrow am. Advised pt call back if worsening pain or new symptoms like fever.  Protocols used: Breast  Symptoms-A-AH, Skin Lump or Localized Swelling-A-AH

## 2024-07-04 ENCOUNTER — Ambulatory Visit (INDEPENDENT_AMBULATORY_CARE_PROVIDER_SITE_OTHER): Admitting: Internal Medicine

## 2024-07-04 ENCOUNTER — Encounter: Payer: Self-pay | Admitting: Internal Medicine

## 2024-07-04 VITALS — BP 120/84 | HR 88 | Temp 98.3°F | Ht 66.0 in | Wt 203.6 lb

## 2024-07-04 DIAGNOSIS — B9689 Other specified bacterial agents as the cause of diseases classified elsewhere: Secondary | ICD-10-CM

## 2024-07-04 DIAGNOSIS — L089 Local infection of the skin and subcutaneous tissue, unspecified: Secondary | ICD-10-CM | POA: Diagnosis not present

## 2024-07-04 MED ORDER — DOXYCYCLINE HYCLATE 100 MG PO TABS
100.0000 mg | ORAL_TABLET | Freq: Two times a day (BID) | ORAL | 0 refills | Status: DC
Start: 2024-07-04 — End: 2024-09-15

## 2024-07-04 NOTE — Patient Instructions (Signed)
 This seems like  superficial skin infection like small boils  sometimes related to hari follicles or sweat glands .  Warm compresses and other wise keep dry. No shaving over areas.   Antibiotic for up to 10 days . If not improving  then fu appt for advice

## 2024-07-04 NOTE — Progress Notes (Signed)
 Chief Complaint  Patient presents with   Mass    Pt c/o lumps underarms. With pain, redness and swelling. Pt added she got one lump on R side and 3 lumps on left. Sx going on for a week. Used alcohol, peroxide. Still the same.     HPI: Sonya Peterson 68 y.o. come in for new problem   Weeks ago developed small pink  tender lumps( like acne)   left side near armpit  used  potato which drew out  the lump but then got one on right  axillary area . Like acne..    no fever exposures  hx of same   Some pain never itched  no new  deoderant.   NO groin  or other  areas involved  ROS: See pertinent positives and negatives per HPI.  Past Medical History:  Diagnosis Date   Allergy     Anxiety    Arthritis    Asthma    Bronchitis    Depression    OCD   History of hepatitis B    History of kidney stones    Hyperlipidemia    Hypertension    Obesity (BMI 30.0-34.9)    OCD (obsessive compulsive disorder)    Prediabetes 11/09/2013   Vitamin D  deficiency     Family History  Problem Relation Age of Onset   Cancer Mother 54       breast   Hypertension Mother    Diabetes Mother    Heart disease Mother    Cancer Father 38       prostate   Arthritis Father    Heart disease Maternal Grandfather        heart failure   Colon cancer Paternal Grandmother    Crohn's disease Daughter    Anxiety disorder Daughter    Asthma Brother     Social History   Socioeconomic History   Marital status: Married    Spouse name: Not on file   Number of children: 1   Years of education: Not on file   Highest education level: Some college, no degree  Occupational History   Not on file  Tobacco Use   Smoking status: Never    Passive exposure: Past   Smokeless tobacco: Never  Vaping Use   Vaping status: Never Used  Substance and Sexual Activity   Alcohol use: Not Currently   Drug use: No   Sexual activity: Not Currently    Birth control/protection: Post-menopausal, Surgical  Other  Topics Concern   Not on file  Social History Narrative   Not on file   Social Drivers of Health   Financial Resource Strain: Low Risk  (06/01/2024)   Overall Financial Resource Strain (CARDIA)    Difficulty of Paying Living Expenses: Not hard at all  Food Insecurity: No Food Insecurity (06/01/2024)   Hunger Vital Sign    Worried About Running Out of Food in the Last Year: Never true    Ran Out of Food in the Last Year: Never true  Transportation Needs: No Transportation Needs (06/01/2024)   PRAPARE - Administrator, Civil Service (Medical): No    Lack of Transportation (Non-Medical): No  Physical Activity: Inactive (06/08/2024)   Exercise Vital Sign    Days of Exercise per Week: 0 days    Minutes of Exercise per Session: 0 min  Stress: No Stress Concern Present (06/01/2024)   [employer] of Occupational Health - Occupational Stress Questionnaire    Feeling  of Stress: Not at all  Social Connections: Socially Integrated (06/08/2024)   Social Connection and Isolation Panel    Frequency of Communication with Friends and Family: More than three times a week    Frequency of Social Gatherings with Friends and Family: Once a week    Attends Religious Services: More than 4 times per year    Active Member of [employer] or Organizations: Yes    Attends Engineer, structural: More than 4 times per year    Marital Status: Married    Outpatient Medications Prior to Visit  Medication Sig Dispense Refill   albuterol  (VENTOLIN  HFA) 108 (90 Base) MCG/ACT inhaler Inhale 2 puffs into the lungs every 6 (six) hours as needed for wheezing or shortness of breath. 8 g 2   azelastine  (ASTELIN ) 0.1 % nasal spray Place 1 spray into both nostrils 2 (two) times daily. Use in each nostril as directed 30 mL 12   cetirizine  (ZYRTEC  ALLERGY ) 10 MG tablet Take 1 tablet (10 mg total) by mouth at bedtime. 90 tablet 1   Cholecalciferol (VITAMIN D3) 5000 units TABS Take 5,000 Units by mouth at bedtime.      FLUoxetine  (PROZAC ) 20 MG capsule Take 3 capsules (60 mg total) by mouth daily. Take 2 capsules Daily for Mood (Patient taking differently: Take 40 mg by mouth daily. Take 2 capsules Daily for Mood 40 mg)     fluticasone -salmeterol (ADVAIR  DISKUS) 500-50 MCG/ACT AEPB Inhale 1 puff into the lungs in the morning and at bedtime. 60 each 5   montelukast  (SINGULAIR ) 10 MG tablet TAKE 1 TABLET DAILY FOR ALLERGIES 90 tablet 3   simvastatin  (ZOCOR ) 40 MG tablet TAKE 1 TABLET AT BEDTIME FOR CHOLESTEROL 90 tablet 3   telmisartan  (MICARDIS ) 40 MG tablet Take 1 tablet (40 mg total) by mouth daily.     No facility-administered medications prior to visit.     EXAM:  BP 120/84 (BP Location: Right Arm, Patient Position: Sitting, Cuff Size: Large)   Pulse 88   Temp 98.3 F (36.8 C) (Oral)   Ht 5' 6 (1.676 m)   Wt 203 lb 9.6 oz (92.4 kg)   SpO2 94%   BMI 32.86 kg/m   Body mass index is 32.86 kg/m.  GENERAL: vitals reviewed and listed above, alert, oriented, appears well hydrated and in no acute distress HEENT: atraumatic, conjunctiva  clear, no obvious abnormalities on inspection of external nose and ears  Skin left axilla area no adenopathy but anterior area with 3 pink red areas  superficial 1-1.2 cm   irreg shap  . No ob folliculitis  . Left anterior axilla  0.3 cm superficial boil like lesion  no vesicle or pustule  no drainage   No axillary adenopathy  or obv folliculitis  PSYCH: pleasant and cooperative, no obvious depression or anxiety  BP Readings from Last 3 Encounters:  07/04/24 120/84  06/08/24 (!) 144/86  03/15/24 134/80    ASSESSMENT AND PLAN:  Discussed the following assessment and plan:  Localized bacterial skin infection - multiple boils? small but  some contiguous tracking  fading.staph possible  -Patient advised to return or notify health care team  if  new concerns arise.  Patient Instructions  This seems like  superficial skin infection like small boils  sometimes  related to hari follicles or sweat glands .  Warm compresses and other wise keep dry. No shaving over areas.   Antibiotic for up to 10 days . If not improving  then fu  appt for advice    [name] K. Madlynn Lundeen M.D.

## 2024-07-17 ENCOUNTER — Other Ambulatory Visit: Payer: Self-pay | Admitting: Family Medicine

## 2024-07-17 DIAGNOSIS — E785 Hyperlipidemia, unspecified: Secondary | ICD-10-CM

## 2024-07-17 MED ORDER — SIMVASTATIN 40 MG PO TABS
ORAL_TABLET | ORAL | 3 refills | Status: AC
Start: 2024-07-17 — End: ?

## 2024-07-17 NOTE — Telephone Encounter (Signed)
 Copied from CRM [id number]. Topic: Clinical - Medication Refill >> Jul 17, 2024  9:51 AM Jasmin G wrote: Medication: simvastatin (ZOCOR) 40 MG tablet  Has the patient contacted their pharmacy? No (Agent: If no, request that the patient contact the pharmacy for the refill. If patient does not wish to contact the pharmacy document the reason why and proceed with request.) (Agent: If yes, when and what did the pharmacy advise?)  This is the patient's preferred pharmacy:  [facility] - [place], [place] - [address] [place] [address] Phone: [phone] Fax: [phone]  Is this the correct pharmacy for this prescription? Yes If no, delete pharmacy and type the correct one.   Has the prescription been filled recently? Yes  Is the patient out of the medication? No  Has the patient been seen for an appointment in the last year OR does the patient have an upcoming appointment? Yes  Can we respond through MyChart? Yes  Agent: Please be advised that Rx refills may take up to 3 business days. We ask that you follow-up with your pharmacy.

## 2024-09-08 ENCOUNTER — Ambulatory Visit: Admitting: Family Medicine

## 2024-09-15 ENCOUNTER — Encounter: Payer: Self-pay | Admitting: Family Medicine

## 2024-09-15 ENCOUNTER — Ambulatory Visit (INDEPENDENT_AMBULATORY_CARE_PROVIDER_SITE_OTHER): Admitting: Family Medicine

## 2024-09-15 VITALS — BP 128/76 | HR 92 | Temp 98.2°F | Ht 66.0 in | Wt 198.0 lb

## 2024-09-15 DIAGNOSIS — T7840XS Allergy, unspecified, sequela: Secondary | ICD-10-CM

## 2024-09-15 DIAGNOSIS — F419 Anxiety disorder, unspecified: Secondary | ICD-10-CM

## 2024-09-15 DIAGNOSIS — I10 Essential (primary) hypertension: Secondary | ICD-10-CM | POA: Diagnosis not present

## 2024-09-15 DIAGNOSIS — J45909 Unspecified asthma, uncomplicated: Secondary | ICD-10-CM

## 2024-09-15 DIAGNOSIS — Z23 Encounter for immunization: Secondary | ICD-10-CM

## 2024-09-15 DIAGNOSIS — F3341 Major depressive disorder, recurrent, in partial remission: Secondary | ICD-10-CM

## 2024-09-15 DIAGNOSIS — Z6831 Body mass index (BMI) 31.0-31.9, adult: Secondary | ICD-10-CM

## 2024-09-15 DIAGNOSIS — T7840XA Allergy, unspecified, initial encounter: Secondary | ICD-10-CM

## 2024-09-15 DIAGNOSIS — E66811 Obesity, class 1: Secondary | ICD-10-CM

## 2024-09-15 DIAGNOSIS — E785 Hyperlipidemia, unspecified: Secondary | ICD-10-CM

## 2024-09-15 DIAGNOSIS — E6609 Other obesity due to excess calories: Secondary | ICD-10-CM

## 2024-09-15 DIAGNOSIS — R7303 Prediabetes: Secondary | ICD-10-CM

## 2024-09-15 MED ORDER — MONTELUKAST SODIUM 10 MG PO TABS
ORAL_TABLET | ORAL | 3 refills | Status: AC
Start: 2024-09-15 — End: ?

## 2024-09-15 MED ORDER — FLUOXETINE HCL 20 MG PO CAPS
60.0000 mg | ORAL_CAPSULE | Freq: Every day | ORAL | 1 refills | Status: DC
Start: 2024-09-15 — End: 2024-09-15

## 2024-09-15 MED ORDER — FLUOXETINE HCL 20 MG PO CAPS
60.0000 mg | ORAL_CAPSULE | Freq: Every day | ORAL | 1 refills | Status: AC
Start: 2024-09-15 — End: 2025-03-14

## 2024-09-15 MED ORDER — TELMISARTAN 40 MG PO TABS
40.0000 mg | ORAL_TABLET | Freq: Every day | ORAL | 0 refills | Status: AC
Start: 2024-09-15 — End: 2025-03-14

## 2024-09-15 NOTE — Progress Notes (Signed)
 Established Patient Office Visit   Subjective:  Patient ID: Sonya Peterson, female    DOB: 12-12-1955  Age: 68 y.o. MRN: [id number]  Chief Complaint  Patient presents with   Medical Management of Chronic Issues    3 months follow up     HPI Asthma: Chronic. Patient is prescribed Albuterol  Inhaler, 2 spray every 6 hours PRN. Does not use often. Also, takes Montelukast  10mg  daily.    Anxiety, depression, and OCD: Chronic. Patient is prescribed Fluoxetine  20mg  tablet and has been taking 60mg . This has been more beneficial than the 40mg  recently took.    Allergies: Chronic. Patient is prescribed Cetirizine  10mg  daily. However, she typically rotates every 6 months between Loratadine  and Zyrtec . This has rotation has been beneficial.    Hyperlipidemia: Chronic. Patient is prescribed Simvastatin  10mg  daily.  Lab Results  Component Value Date   CHOL 140 06/08/2024   HDL 42.20 06/08/2024   LDLCALC 76 06/08/2024   TRIG 107.0 06/08/2024   CHOLHDL 3 06/08/2024     HTN: Chronic. Patient is prescribed Telmisartan  40mg  tablet daily. Does not monitor her blood pressure. Denies CP, SHOB, HA, dizziness, lightheadedness, or lower extremity edema.  BP Readings from Last 3 Encounters:  09/15/24 128/76  07/04/24 120/84  06/08/24 (!) 144/86    ROS See HPI above     Objective:   BP 128/76   Pulse 92   Temp 98.2 F (36.8 C) (Oral)   Ht 5' 6 (1.676 m)   Wt 198 lb (89.8 kg)   SpO2 92%   BMI 31.96 kg/m    Physical Exam Vitals reviewed.  Constitutional:      General: She is not in acute distress.    Appearance: Normal appearance. She is obese. She is not ill-appearing, toxic-appearing or diaphoretic.  HENT:     Head: Normocephalic and atraumatic.  Eyes:     General:        Right eye: No discharge.        Left eye: No discharge.     Conjunctiva/sclera: Conjunctivae normal.  Cardiovascular:     Rate and Rhythm: Normal rate and regular rhythm.     Heart sounds: Normal heart  sounds. No murmur heard.    No friction rub. No gallop.  Pulmonary:     Effort: Pulmonary effort is normal. No respiratory distress.     Breath sounds: Normal breath sounds.  Musculoskeletal:        General: Normal range of motion.  Skin:    General: Skin is warm and dry.  Neurological:     General: No focal deficit present.     Mental Status: She is alert and oriented to person, place, and time. Mental status is at baseline.  Psychiatric:        Mood and Affect: Mood normal.        Behavior: Behavior normal.        Thought Content: Thought content normal.        Judgment: Judgment normal.      Assessment & Plan:  Primary hypertension Assessment & Plan: Blood pressure is stable. Continue Telmisartan  40mg  daily. Refilled medication. Ordered CMP to assess kidney function.   Orders: -     Telmisartan ; Take 1 tablet (40 mg total) by mouth daily.  Dispense: 180 tablet; Refill: 0 -     Comprehensive metabolic panel with GFR; Future  Uncomplicated asthma, unspecified asthma severity, unspecified whether persistent Assessment & Plan: Stable. Continue Albuterol  Inhaler PRN and Montelukast  10mg   daily. Refilled Montelukast .     Orders: -     Montelukast  Sodium; Take  1 tablet  Daily  for Allergies  Dispense: 90 tablet; Refill: 3  Recurrent major depressive disorder, in partial remission Assessment & Plan: Stable. Patient scored 0 on PHQ-9 and 3 on GAD-7. Patient is taking Fluoxetine  20mg  tablet, 3 tablets daily. Refilled medication.    Orders: -     FLUoxetine  HCl; Take 3 capsules (60 mg total) by mouth daily.  Dispense: 270 capsule; Refill: 1  Anxiety Assessment & Plan: Stable. Patient scored 0 on PHQ-9 and 3 on GAD-7. Patient is taking Fluoxetine  20mg  tablet, 3 tablets daily. Refilled medication.      Allergy , sequela Assessment & Plan: Continue Cetirizine  10mg  daily.    Hyperlipidemia, unspecified hyperlipidemia type Assessment & Plan: Stable. Continue Simvastatin   10mg  daily. Ordered lipid panel and CMP.     Orders: -     Comprehensive metabolic panel with GFR; Future -     Lipid panel; Future  Prediabetes -     Hemoglobin A1c; Future  Class 1 obesity due to excess calories with serious comorbidity and body mass index (BMI) of 31.0 to 31.9 in adult -     Comprehensive metabolic panel with GFR; Future -     Lipid panel; Future -     Hemoglobin A1c; Future  Immunization due -     Flu vaccine HIGH DOSE PF(Fluzone Trivalent)  1.Review health maintenance: -Influenza vaccine: Administer -Covid booster: May obtain at local pharmacy  -Dexa scan: Declines  -PNA vaccine: May obtain at local pharmacy  -Zoster vaccine: [facility] at [facility]  -Cervical cancer screening: Declines  2.Ordered labs (A1c, CMP, Lipid) based on prediabetes and obesity.  Return in about 6 months (around 03/16/2025) for chronic management: AWV visit Rojelio, LPN; lab appointment .   Keymani Mclean, NP

## 2024-09-18 NOTE — Assessment & Plan Note (Signed)
 Stable. Continue Simvastatin  10mg  daily. Ordered lipid panel and CMP.

## 2024-09-18 NOTE — Assessment & Plan Note (Signed)
 Blood pressure is stable. Continue Telmisartan  40mg  daily. Refilled medication. Ordered CMP to assess kidney function.

## 2024-09-18 NOTE — Assessment & Plan Note (Addendum)
-   Continue Cetirizine 10mg  daily

## 2024-09-18 NOTE — Patient Instructions (Addendum)
-  It was nice to see you.  -Influenza vaccine provided.  -May obtain your covid booster and pneumonia vaccine at your local pharmacy.  -Refilled Telmisartan , Fluoxetine , and Montelukast . Continue all other medications. -Ordered labs. Please schedule a lab appointment to have labs drawn when able to fast. Office will call with lab results and will be available via MyChart.  -Follow up in 6 months for a chronic management and Annual Wellness Visit.

## 2024-09-18 NOTE — Assessment & Plan Note (Signed)
 Stable. Continue Albuterol  Inhaler PRN and Montelukast  10mg  daily. Refilled Montelukast .

## 2024-09-18 NOTE — Assessment & Plan Note (Signed)
 Stable. Patient scored 0 on PHQ-9 and 3 on GAD-7. Patient is taking Fluoxetine  20mg  tablet, 3 tablets daily. Refilled medication.

## 2024-09-22 ENCOUNTER — Ambulatory Visit: Payer: Self-pay | Admitting: Family Medicine

## 2024-09-22 ENCOUNTER — Other Ambulatory Visit

## 2024-09-22 DIAGNOSIS — R7303 Prediabetes: Secondary | ICD-10-CM | POA: Diagnosis not present

## 2024-09-22 DIAGNOSIS — E785 Hyperlipidemia, unspecified: Secondary | ICD-10-CM | POA: Diagnosis not present

## 2024-09-22 DIAGNOSIS — I10 Essential (primary) hypertension: Secondary | ICD-10-CM | POA: Diagnosis not present

## 2024-09-22 DIAGNOSIS — E66811 Obesity, class 1: Secondary | ICD-10-CM

## 2024-09-22 DIAGNOSIS — E6609 Other obesity due to excess calories: Secondary | ICD-10-CM

## 2024-09-22 DIAGNOSIS — Z6831 Body mass index (BMI) 31.0-31.9, adult: Secondary | ICD-10-CM | POA: Diagnosis not present

## 2024-09-22 LAB — LIPID PANEL
Cholesterol: 138 mg/dL (ref 0–200)
HDL: 32.4 mg/dL — ABNORMAL LOW (ref >39.00)
LDL Cholesterol: 81 mg/dL (ref 0–99)
NonHDL: 105.68
Total CHOL/HDL Ratio: 4
Triglycerides: 124 mg/dL (ref 0.0–149.0)
VLDL: 24.8 mg/dL (ref 0.0–40.0)

## 2024-09-22 LAB — COMPREHENSIVE METABOLIC PANEL WITH GFR
ALT: 10 U/L (ref 0–35)
AST: 13 U/L (ref 0–37)
Albumin: 4.3 g/dL (ref 3.5–5.2)
Alkaline Phosphatase: 85 U/L (ref 39–117)
BUN: 15 mg/dL (ref 6–23)
CO2: 27 meq/L (ref 19–32)
Calcium: 8.5 mg/dL (ref 8.4–10.5)
Chloride: 102 meq/L (ref 96–112)
Creatinine, Ser: 0.77 mg/dL (ref 0.40–1.20)
GFR: 80.61 mL/min (ref >60.00)
Glucose, Bld: 95 mg/dL (ref 70–99)
Potassium: 3.5 meq/L (ref 3.5–5.1)
Sodium: 138 meq/L (ref 135–145)
Total Bilirubin: 0.5 mg/dL (ref 0.2–1.2)
Total Protein: 7.8 g/dL (ref 6.0–8.3)

## 2024-09-22 LAB — HEMOGLOBIN A1C: Hgb A1c MFr Bld: 6 % (ref 4.6–6.5)

## 2024-11-10 ENCOUNTER — Ambulatory Visit

## 2024-11-10 VITALS — BP 120/60 | HR 80 | Temp 98.3°F | Ht 66.0 in | Wt 197.0 lb

## 2024-11-10 DIAGNOSIS — Z0000 Encounter for general adult medical examination without abnormal findings: Secondary | ICD-10-CM | POA: Diagnosis not present

## 2024-11-10 NOTE — Patient Instructions (Addendum)
 Sonya Peterson,  Thank you for taking the time for your Medicare Wellness Visit. I appreciate your continued commitment to your health goals. Please review the care plan we discussed, and feel free to reach out if I can assist you further.  Please note that Annual Wellness Visits do not include a physical exam. Some assessments may be limited, especially if the visit was conducted virtually. If needed, we may recommend an in-person follow-up with your provider.  Ongoing Care Seeing your primary care provider every 3 to 6 months helps us  monitor your health and provide consistent, personalized care.   Referrals If a referral was made during today's visit and you haven't received any updates within two weeks, please contact the referred provider directly to check on the status.  Recommended Screenings:  Health Maintenance  Topic Date Due   Pneumococcal Vaccine for age over 35 (1 of 2 - PCV) 09/18/1977   Zoster (Shingles) Vaccine (1 of 2) Never done   Osteoporosis screening with Bone Density Scan  Never done   COVID-19 Vaccine (5 - 2025-26 season) 08/07/2024   DTaP/Tdap/Td vaccine (2 - Td or Tdap) 03/27/2025   Medicare Annual Wellness Visit  11/10/2025   Breast Cancer Screening  04/07/2026   Colon Cancer Screening  08/26/2027   Flu Shot  Completed   Hepatitis C Screening  Completed   Meningitis B Vaccine  Aged Out       11/10/2024    1:09 PM  Advanced Directives  Does Patient Have a Medical Advance Directive? Yes  Type of Estate Agent of [place];Living will  Does patient want to make changes to medical advance directive? No - Patient declined  Copy of Healthcare Power of Attorney in Chart? No - copy requested    Vision: Annual vision screenings are recommended for early detection of glaucoma, cataracts, and diabetic retinopathy. These exams can also reveal signs of chronic conditions such as diabetes and high blood pressure.  Dental: Annual dental screenings  help detect early signs of oral cancer, gum disease, and other conditions linked to overall health, including heart disease and diabetes.  Please see the attached documents for additional preventive care recommendations.

## 2024-11-10 NOTE — Progress Notes (Signed)
 Chief Complaint  Patient presents with   Medicare Wellness     Subjective:   Sonya Peterson is a 68 y.o. female who presents for a Medicare Annual Wellness Visit.  Visit info / Clinical Intake: Medicare Wellness Visit Type:: Subsequent Annual Wellness Visit Persons participating in visit and providing information:: patient Medicare Wellness Visit Mode:: In-person (required for WTM) Interpreter Needed?: No Pre-visit prep was completed: yes AWV questionnaire completed by patient prior to visit?: yes Date:: 11/08/24 Living arrangements:: lives with spouse/significant other; with family/others Patient's Overall Health Status Rating: good Typical amount of pain: none Does pain affect daily life?: no Are you currently prescribed opioids?: no  Dietary Habits and Nutritional Risks How many meals a day?: 2 Eats fruit and vegetables daily?: (!) no Most meals are obtained by: preparing own meals Diabetic:: no  Functional Status Activities of Daily Living (to include ambulation/medication): Independent Ambulation: Independent with device- listed below Home Assistive Devices/Equipment: Eyeglasses Medication Administration: Independent Home Management (perform basic housework or laundry): Independent Manage your own finances?: yes Primary transportation is: driving Concerns about vision?: no *vision screening is required for WTM* Concerns about hearing?: no  Fall Screening Falls in the past year?: 0 Number of falls in past year: 0 Was there an injury with Fall?: 0 Fall Risk Category Calculator: 0 Patient Fall Risk Level: Low Fall Risk  Fall Risk Patient at Risk for Falls Due to: No Fall Risks Fall risk Follow up: Falls evaluation completed  Home and Transportation Safety: All rugs have non-skid backing?: N/A, no rugs All stairs or steps have railings?: N/A, no stairs Grab bars in the bathtub or shower?: yes Have non-skid surface in bathtub or shower?: (!) no Good home  lighting?: yes Regular seat belt use?: yes Hospital stays in the last year:: no  Cognitive Assessment Difficulty concentrating, remembering, or making decisions? : no Will 6CIT or Mini Cog be Completed: yes What year is it?: 0 points What month is it?: 0 points Give patient an address phrase to remember (5 components): [address] [place]  About what time is it?: 0 points Count backwards from 20 to 1: 0 points Say the months of the year in reverse: 0 points Repeat the address phrase from earlier: 0 points 6 CIT Score: 0 points  Advance Directives (For Healthcare) Does Patient Have a Medical Advance Directive?: Yes Does patient want to make changes to medical advance directive?: No - Patient declined Type of Advance Directive: Healthcare Power of [place]; Living will Copy of Healthcare Power of Attorney in Chart?: No - copy requested Copy of Living Will in Chart?: No - copy requested  Reviewed/Updated  Reviewed/Updated: Reviewed All (Medical, Surgical, Family, Medications, Allergies, Care Teams, Patient Goals)    Allergies (verified) Patient has no known allergies.   Current Medications (verified) Outpatient Encounter Medications as of 11/10/2024  Medication Sig   albuterol  (VENTOLIN  HFA) 108 (90 Base) MCG/ACT inhaler Inhale 2 puffs into the lungs every 6 (six) hours as needed for wheezing or shortness of breath.   azelastine  (ASTELIN ) 0.1 % nasal spray Place 1 spray into both nostrils 2 (two) times daily. Use in each nostril as directed   cetirizine  (ZYRTEC  ALLERGY ) 10 MG tablet Take 1 tablet (10 mg total) by mouth at bedtime.   Cholecalciferol (VITAMIN D3) 5000 units TABS Take 5,000 Units by mouth at bedtime.   FLUoxetine  (PROZAC ) 20 MG capsule Take 3 capsules (60 mg total) by mouth daily.   fluticasone -salmeterol (ADVAIR  DISKUS) 500-50 MCG/ACT AEPB Inhale  1 puff into the lungs in the morning and at bedtime.   montelukast  (SINGULAIR ) 10 MG tablet Take  1 tablet  Daily  for  Allergies   simvastatin  (ZOCOR ) 40 MG tablet TAKE 1 TABLET AT BEDTIME FOR CHOLESTEROL   telmisartan  (MICARDIS ) 40 MG tablet Take 1 tablet (40 mg total) by mouth daily.   No facility-administered encounter medications on file as of 11/10/2024.    History: Past Medical History:  Diagnosis Date   Allergy     Anxiety    Arthritis    Asthma    Bronchitis    Depression    OCD   History of hepatitis B    History of kidney stones    Hyperlipidemia    Hypertension    Obesity (BMI 30.0-34.9)    OCD (obsessive compulsive disorder)    Prediabetes 11/09/2013   Vitamin D  deficiency    Past Surgical History:  Procedure Laterality Date   ACHILLES TENDON SURGERY     BACK SURGERY     BREAST SURGERY Left    lump removed left   DILATION AND CURETTAGE OF UTERUS     EXTRACORPOREAL SHOCK WAVE LITHOTRIPSY Left 10/23/2019   Procedure: EXTRACORPOREAL SHOCK WAVE LITHOTRIPSY (ESWL);  Surgeon: Nieves Cough, MD;  Location: [facility] ORS;  Service: Urology;  Laterality: Left;   HEEL SPUR SURGERY Left 2015   I & D EXTREMITY Right 08/06/2017   Procedure: IRRIGATION AND DEBRIDEMENT RIGHT ACHILLES;  Surgeon: Harden Jerona GAILS, MD;  Location: [facility] OR;  Service: Orthopedics;  Laterality: Right;   LUMBAR DISC SURGERY  2004   TUBAL LIGATION     Family History  Problem Relation Age of Onset   Cancer Mother 73       breast   Hypertension Mother    Diabetes Mother    Heart disease Mother    Cancer Father 25       prostate   Arthritis Father    Heart disease Maternal Grandfather        heart failure   Colon cancer Paternal Grandmother    Crohn's disease Daughter    Anxiety disorder Daughter    Asthma Brother    Social History   Occupational History   Not on file  Tobacco Use   Smoking status: Never    Passive exposure: Past   Smokeless tobacco: Never  Vaping Use   Vaping status: Never Used  Substance and Sexual Activity   Alcohol use: Not Currently   Drug use: No   Sexual activity: Not Currently     Birth control/protection: Post-menopausal, Surgical   Tobacco Counseling Counseling given: No  SDOH Screenings   Food Insecurity: No Food Insecurity (11/10/2024)  Housing: Unknown (11/10/2024)  Transportation Needs: No Transportation Needs (11/10/2024)  Utilities: Not At Risk (11/10/2024)  Alcohol Screen: Low Risk  (11/08/2024)  Depression (PHQ2-9): Low Risk  (11/10/2024)  Financial Resource Strain: Low Risk  (11/08/2024)  Physical Activity: Inactive (11/10/2024)  Social Connections: Unknown (11/10/2024)  Stress: No Stress Concern Present (11/10/2024)  Tobacco Use: Low Risk  (11/10/2024)  Health Literacy: Adequate Health Literacy (11/10/2024)   See flowsheets for full screening details  Depression Screen PHQ 2 & 9 Depression Scale- Over the past 2 weeks, how often have you been bothered by any of the following problems? Little interest or pleasure in doing things: 0 Feeling down, depressed, or hopeless (PHQ Adolescent also includes...irritable): 0 PHQ-2 Total Score: 0 Trouble falling or staying asleep, or sleeping too much: 0 Feeling tired or having little  energy: 0 Poor appetite or overeating (PHQ Adolescent also includes...weight loss): 0 Feeling bad about yourself - or that you are a failure or have let yourself or your family down: 0 Trouble concentrating on things, such as reading the newspaper or watching television (PHQ Adolescent also includes...like school work): 0 Moving or speaking so slowly that other people could have noticed. Or the opposite - being so fidgety or restless that you have been moving around a lot more than usual: 0 Thoughts that you would be better off dead, or of hurting yourself in some way: 0 PHQ-9 Total Score: 0 If you checked off any problems, how difficult have these problems made it for you to do your work, take care of things at home, or get along with other people?: Not difficult at all     Goals Addressed               This Visit's Progress      Increase physical activity (pt-stated)        Get more active.             Objective:    Today's Vitals   11/10/24 1250  BP: 120/60  Pulse: 80  Temp: 98.3 F (36.8 C)  TempSrc: Oral  SpO2: 95%  Weight: 197 lb (89.4 kg)  Height: 5' 6 (1.676 m)   Body mass index is 31.8 kg/m.  Hearing/Vision screen Hearing Screening - Comments:: Denies hearing difficulties   Vision Screening - Comments:: Wears rx glasses - up to date with routine eye exams with  [facility] Immunizations and Health Maintenance Health Maintenance  Topic Date Due   Pneumococcal Vaccine: 50+ Years (1 of 2 - PCV) 09/18/1977   Zoster Vaccines- Shingrix (1 of 2) Never done   Bone Density Scan  Never done   COVID-19 Vaccine (5 - 2025-26 season) 08/07/2024   DTaP/Tdap/Td (2 - Td or Tdap) 03/27/2025   Medicare Annual Wellness (AWV)  11/10/2025   Mammogram  04/07/2026   Colonoscopy  08/26/2027   Influenza Vaccine  Completed   Hepatitis C Screening  Completed   Meningococcal B Vaccine  Aged Out        Assessment/Plan:  This is a routine wellness examination for Sonya Peterson.  Patient Care Team: Billy Philippe SAUNDERS, NP as PCP - General (Family Medicine) Harden Jerona GAILS, MD as Consulting Physician (Orthopedic Surgery) Luis Purchase, MD as Consulting Physician (Gastroenterology) Nieves Cough, MD as Consulting Physician (Urology) Meade Verdon RAMAN, MD as Consulting Physician (Pulmonary Disease)  I have personally reviewed and noted the following in the patient's chart:   Medical and social history Use of alcohol, tobacco or illicit drugs  Current medications and supplements including opioid prescriptions. Functional ability and status Nutritional status Physical activity Advanced directives List of other physicians Hospitalizations, surgeries, and ER visits in previous 12 months Vitals Screenings to include cognitive, depression, and falls Referrals and appointments  No orders of the defined  types were placed in this encounter.  In addition, I have reviewed and discussed with patient certain preventive protocols, quality metrics, and best practice recommendations. A written personalized care plan for preventive services as well as general preventive health recommendations were provided to patient.   Sonya LELON Blush, LPN   [id number]   Return in 1 year on 11/16/25  After Visit Summary: (In Person-Printed) AVS printed and given to the patient  Nurse Notes: None

## 2025-03-16 ENCOUNTER — Ambulatory Visit: Admitting: Family Medicine

## 2025-11-16 ENCOUNTER — Ambulatory Visit
